# Patient Record
Sex: Female | Born: 1945 | Race: White | Hispanic: No | Smoking: Former smoker
Health system: Southern US, Community
[De-identification: ages and names within clinical notes are randomized; demographics above are authoritative.]

## PROBLEM LIST (undated history)

## (undated) DIAGNOSIS — R011 Cardiac murmur, unspecified: Secondary | ICD-10-CM

## (undated) DIAGNOSIS — Z8739 Personal history of other diseases of the musculoskeletal system and connective tissue: Secondary | ICD-10-CM

## (undated) DIAGNOSIS — I2510 Atherosclerotic heart disease of native coronary artery without angina pectoris: Secondary | ICD-10-CM

## (undated) DIAGNOSIS — Z794 Long term (current) use of insulin: Secondary | ICD-10-CM

## (undated) DIAGNOSIS — I6529 Occlusion and stenosis of unspecified carotid artery: Secondary | ICD-10-CM

## (undated) DIAGNOSIS — Z8673 Personal history of transient ischemic attack (TIA), and cerebral infarction without residual deficits: Secondary | ICD-10-CM

## (undated) DIAGNOSIS — I10 Essential (primary) hypertension: Secondary | ICD-10-CM

## (undated) DIAGNOSIS — Z8669 Personal history of other diseases of the nervous system and sense organs: Secondary | ICD-10-CM

## (undated) DIAGNOSIS — N185 Chronic kidney disease, stage 5: Secondary | ICD-10-CM

## (undated) DIAGNOSIS — D509 Iron deficiency anemia, unspecified: Secondary | ICD-10-CM

## (undated) DIAGNOSIS — L97509 Non-pressure chronic ulcer of other part of unspecified foot with unspecified severity: Secondary | ICD-10-CM

## (undated) DIAGNOSIS — E785 Hyperlipidemia, unspecified: Secondary | ICD-10-CM

## (undated) DIAGNOSIS — E119 Type 2 diabetes mellitus without complications: Secondary | ICD-10-CM

## (undated) DIAGNOSIS — IMO0001 Reserved for inherently not codable concepts without codable children: Secondary | ICD-10-CM

## (undated) DIAGNOSIS — M1990 Unspecified osteoarthritis, unspecified site: Secondary | ICD-10-CM

## (undated) DIAGNOSIS — H269 Unspecified cataract: Secondary | ICD-10-CM

## (undated) DIAGNOSIS — G629 Polyneuropathy, unspecified: Secondary | ICD-10-CM

## (undated) DIAGNOSIS — I739 Peripheral vascular disease, unspecified: Secondary | ICD-10-CM

## (undated) DIAGNOSIS — K219 Gastro-esophageal reflux disease without esophagitis: Secondary | ICD-10-CM

## (undated) DIAGNOSIS — I509 Heart failure, unspecified: Secondary | ICD-10-CM

## (undated) DIAGNOSIS — K449 Diaphragmatic hernia without obstruction or gangrene: Secondary | ICD-10-CM

## (undated) HISTORY — DX: Unspecified osteoarthritis, unspecified site: M19.90

## (undated) HISTORY — DX: Hyperlipidemia, unspecified: E78.5

## (undated) HISTORY — PX: CATARACT EXTRACTION: SUR2

## (undated) HISTORY — DX: Diaphragmatic hernia without obstruction or gangrene: K44.9

## (undated) HISTORY — DX: Occlusion and stenosis of unspecified carotid artery: I65.29

## (undated) HISTORY — DX: Unspecified cataract: H26.9

## (undated) HISTORY — DX: Polyneuropathy, unspecified: G62.9

## (undated) HISTORY — DX: Type 2 diabetes mellitus without complications: E11.9

## (undated) HISTORY — DX: Non-pressure chronic ulcer of other part of unspecified foot with unspecified severity: L97.509

## (undated) HISTORY — DX: Heart failure, unspecified: I50.9

## (undated) HISTORY — DX: Reserved for inherently not codable concepts without codable children: IMO0001

## (undated) HISTORY — DX: Long term (current) use of insulin: Z79.4

## (undated) HISTORY — DX: Essential (primary) hypertension: I10

## (undated) HISTORY — DX: Atherosclerotic heart disease of native coronary artery without angina pectoris: I25.10

## (undated) HISTORY — DX: Gastro-esophageal reflux disease without esophagitis: K21.9

## (undated) HISTORY — DX: Cardiac murmur, unspecified: R01.1

---

## 1999-05-20 ENCOUNTER — Ambulatory Visit (HOSPITAL_COMMUNITY): Admission: RE | Admit: 1999-05-20 | Discharge: 1999-05-20 | Payer: Self-pay | Admitting: Family Medicine

## 2001-03-08 ENCOUNTER — Ambulatory Visit (HOSPITAL_COMMUNITY): Admission: RE | Admit: 2001-03-08 | Discharge: 2001-03-08 | Payer: Self-pay | Admitting: Family Medicine

## 2001-03-08 ENCOUNTER — Encounter: Payer: Self-pay | Admitting: Family Medicine

## 2002-03-27 ENCOUNTER — Ambulatory Visit (HOSPITAL_COMMUNITY): Admission: RE | Admit: 2002-03-27 | Discharge: 2002-03-27 | Payer: Self-pay | Admitting: Family Medicine

## 2002-03-27 ENCOUNTER — Encounter: Payer: Self-pay | Admitting: Family Medicine

## 2003-05-03 ENCOUNTER — Encounter: Payer: Self-pay | Admitting: Orthopedic Surgery

## 2003-05-03 ENCOUNTER — Ambulatory Visit (HOSPITAL_COMMUNITY): Admission: RE | Admit: 2003-05-03 | Discharge: 2003-05-03 | Payer: Self-pay | Admitting: Orthopedic Surgery

## 2005-08-30 ENCOUNTER — Inpatient Hospital Stay (HOSPITAL_COMMUNITY): Admission: EM | Admit: 2005-08-30 | Discharge: 2005-09-13 | Payer: Self-pay | Admitting: Emergency Medicine

## 2005-09-08 ENCOUNTER — Ambulatory Visit: Payer: Self-pay | Admitting: *Deleted

## 2005-09-11 ENCOUNTER — Encounter: Payer: Self-pay | Admitting: *Deleted

## 2005-09-11 ENCOUNTER — Ambulatory Visit: Payer: Self-pay | Admitting: Internal Medicine

## 2005-09-13 ENCOUNTER — Ambulatory Visit: Payer: Self-pay | Admitting: Cardiology

## 2005-09-18 ENCOUNTER — Emergency Department (HOSPITAL_COMMUNITY): Admission: EM | Admit: 2005-09-18 | Discharge: 2005-09-18 | Payer: Self-pay | Admitting: Emergency Medicine

## 2005-09-26 ENCOUNTER — Ambulatory Visit: Payer: Self-pay | Admitting: *Deleted

## 2005-10-05 ENCOUNTER — Inpatient Hospital Stay (HOSPITAL_COMMUNITY): Admission: EM | Admit: 2005-10-05 | Discharge: 2005-10-11 | Payer: Self-pay | Admitting: Emergency Medicine

## 2005-10-09 ENCOUNTER — Ambulatory Visit: Payer: Self-pay | Admitting: Internal Medicine

## 2005-10-12 ENCOUNTER — Encounter (HOSPITAL_COMMUNITY): Admission: RE | Admit: 2005-10-12 | Discharge: 2005-11-11 | Payer: Self-pay | Admitting: Internal Medicine

## 2005-11-14 ENCOUNTER — Encounter (HOSPITAL_COMMUNITY): Admission: RE | Admit: 2005-11-14 | Discharge: 2005-12-14 | Payer: Self-pay | Admitting: General Surgery

## 2005-12-15 ENCOUNTER — Encounter (HOSPITAL_COMMUNITY): Admission: RE | Admit: 2005-12-15 | Discharge: 2006-01-14 | Payer: Self-pay | Admitting: General Surgery

## 2005-12-28 ENCOUNTER — Ambulatory Visit: Payer: Self-pay | Admitting: *Deleted

## 2006-01-07 ENCOUNTER — Emergency Department (HOSPITAL_COMMUNITY): Admission: EM | Admit: 2006-01-07 | Discharge: 2006-01-07 | Payer: Self-pay | Admitting: Emergency Medicine

## 2006-01-08 ENCOUNTER — Inpatient Hospital Stay (HOSPITAL_COMMUNITY): Admission: AD | Admit: 2006-01-08 | Discharge: 2006-01-17 | Payer: Self-pay | Admitting: General Surgery

## 2006-01-12 ENCOUNTER — Encounter (INDEPENDENT_AMBULATORY_CARE_PROVIDER_SITE_OTHER): Payer: Self-pay | Admitting: Specialist

## 2006-03-07 ENCOUNTER — Inpatient Hospital Stay (HOSPITAL_COMMUNITY): Admission: EM | Admit: 2006-03-07 | Discharge: 2006-03-11 | Payer: Self-pay | Admitting: Emergency Medicine

## 2006-03-09 ENCOUNTER — Ambulatory Visit: Payer: Self-pay | Admitting: Internal Medicine

## 2006-04-05 ENCOUNTER — Inpatient Hospital Stay (HOSPITAL_COMMUNITY): Admission: EM | Admit: 2006-04-05 | Discharge: 2006-04-18 | Payer: Self-pay | Admitting: Emergency Medicine

## 2006-04-06 ENCOUNTER — Ambulatory Visit: Payer: Self-pay | Admitting: Oncology

## 2006-04-09 ENCOUNTER — Ambulatory Visit: Payer: Self-pay | Admitting: Cardiology

## 2006-04-10 ENCOUNTER — Ambulatory Visit: Payer: Self-pay | Admitting: Orthopedic Surgery

## 2006-04-25 ENCOUNTER — Encounter: Admission: RE | Admit: 2006-04-25 | Discharge: 2006-04-25 | Payer: Self-pay | Admitting: Oncology

## 2006-04-25 ENCOUNTER — Ambulatory Visit (HOSPITAL_COMMUNITY): Payer: Self-pay | Admitting: Oncology

## 2006-05-08 ENCOUNTER — Inpatient Hospital Stay (HOSPITAL_COMMUNITY): Admission: EM | Admit: 2006-05-08 | Discharge: 2006-05-17 | Payer: Self-pay | Admitting: Emergency Medicine

## 2006-07-19 ENCOUNTER — Inpatient Hospital Stay (HOSPITAL_COMMUNITY): Admission: EM | Admit: 2006-07-19 | Discharge: 2006-07-23 | Payer: Self-pay | Admitting: Emergency Medicine

## 2006-09-25 HISTORY — PX: CAROTID ENDARTERECTOMY: SUR193

## 2006-12-26 ENCOUNTER — Encounter (HOSPITAL_COMMUNITY): Admission: RE | Admit: 2006-12-26 | Discharge: 2007-03-26 | Payer: Self-pay | Admitting: Nephrology

## 2007-03-22 IMAGING — CR DG FOOT COMPLETE 3+V*R*
3 series · 3 of 3 positions shown · non-contrast
Comparison: None.

CLINICAL DATA: Spider bite between the 1st and 2nd toes.  
 RIGHT FOOT ? 3 VIEW:

[view not recorded (1 of 3)]
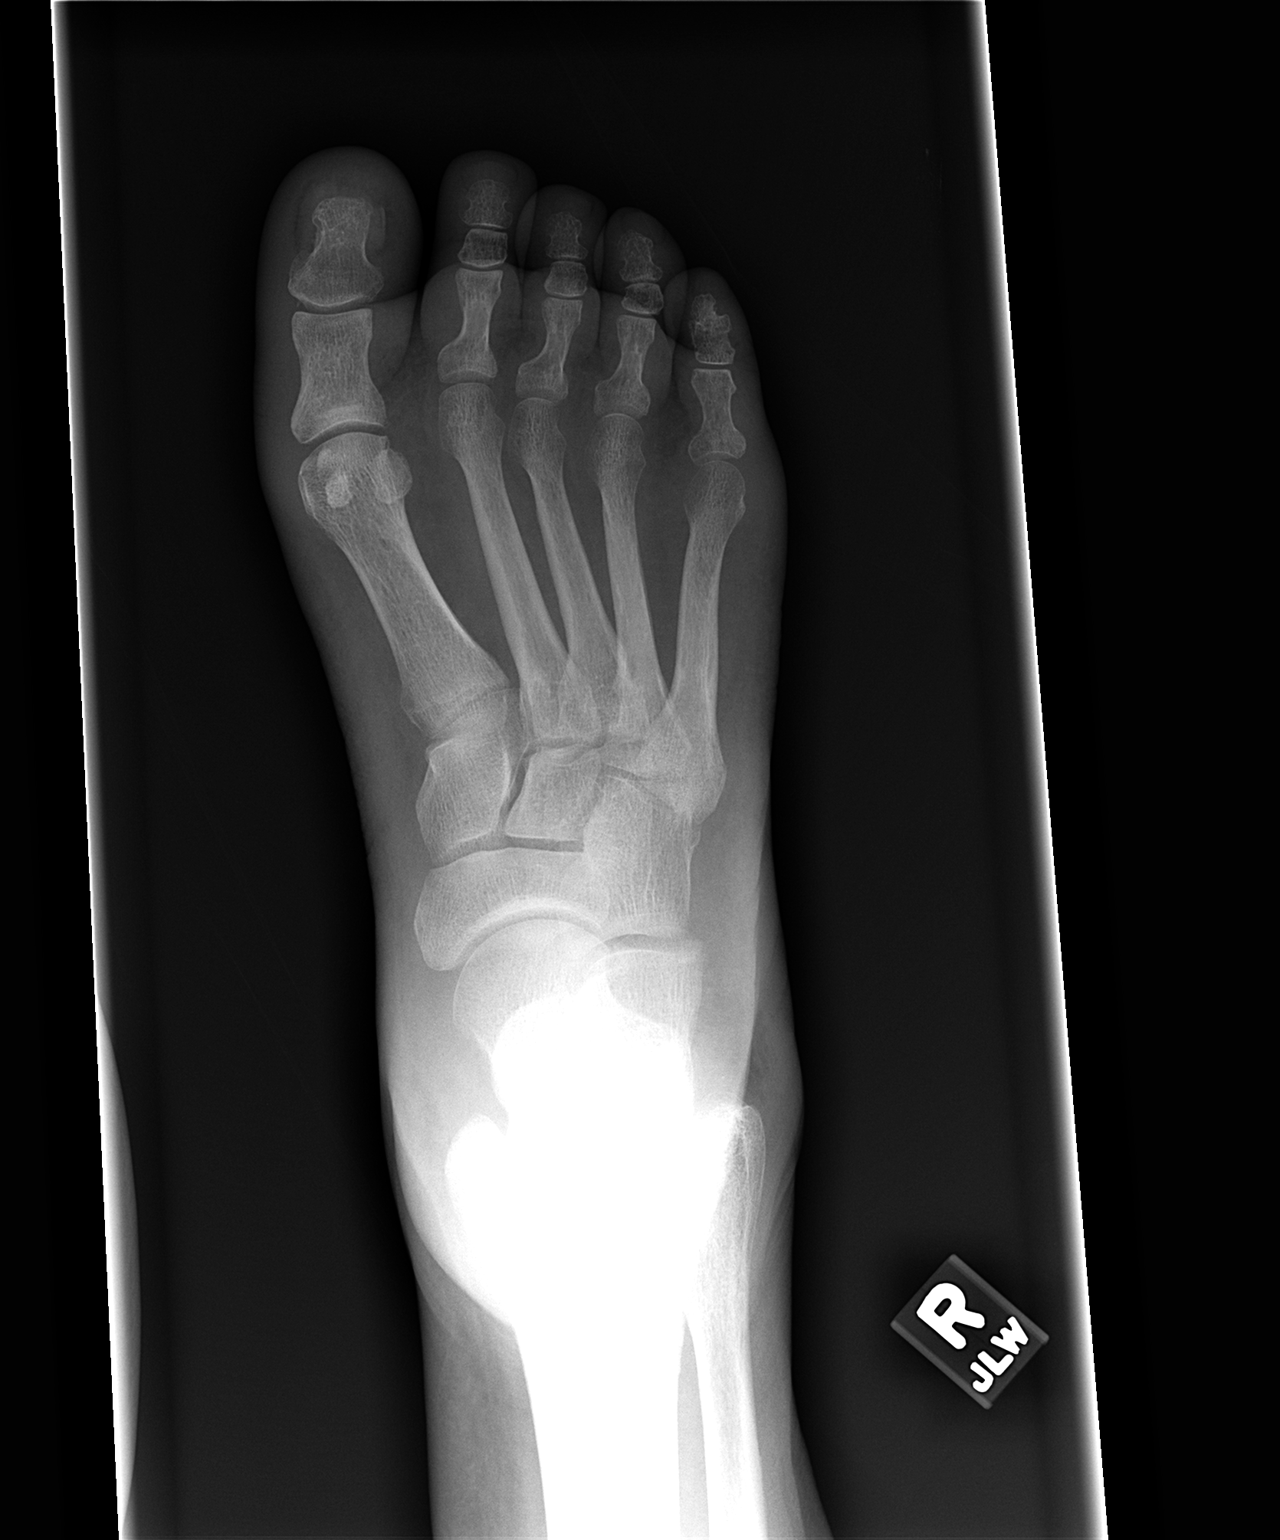

[view not recorded (2 of 3)]
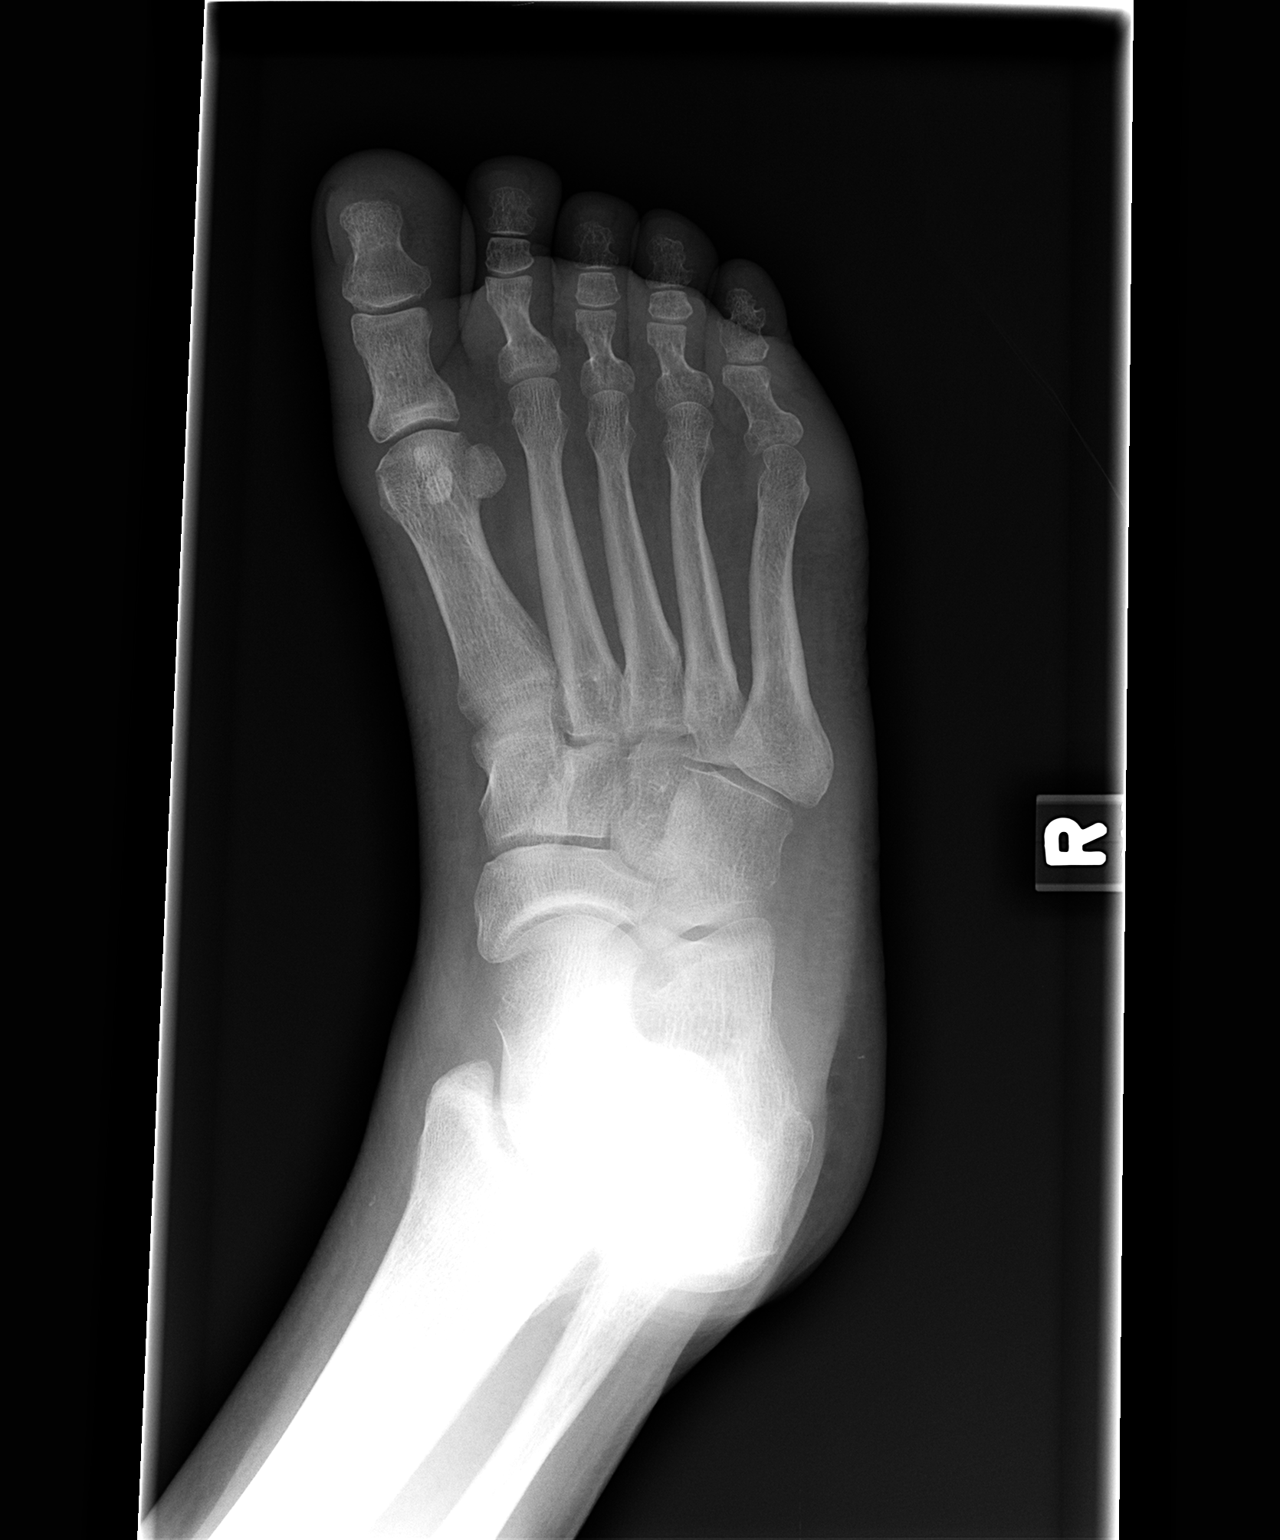

[view not recorded (3 of 3)]
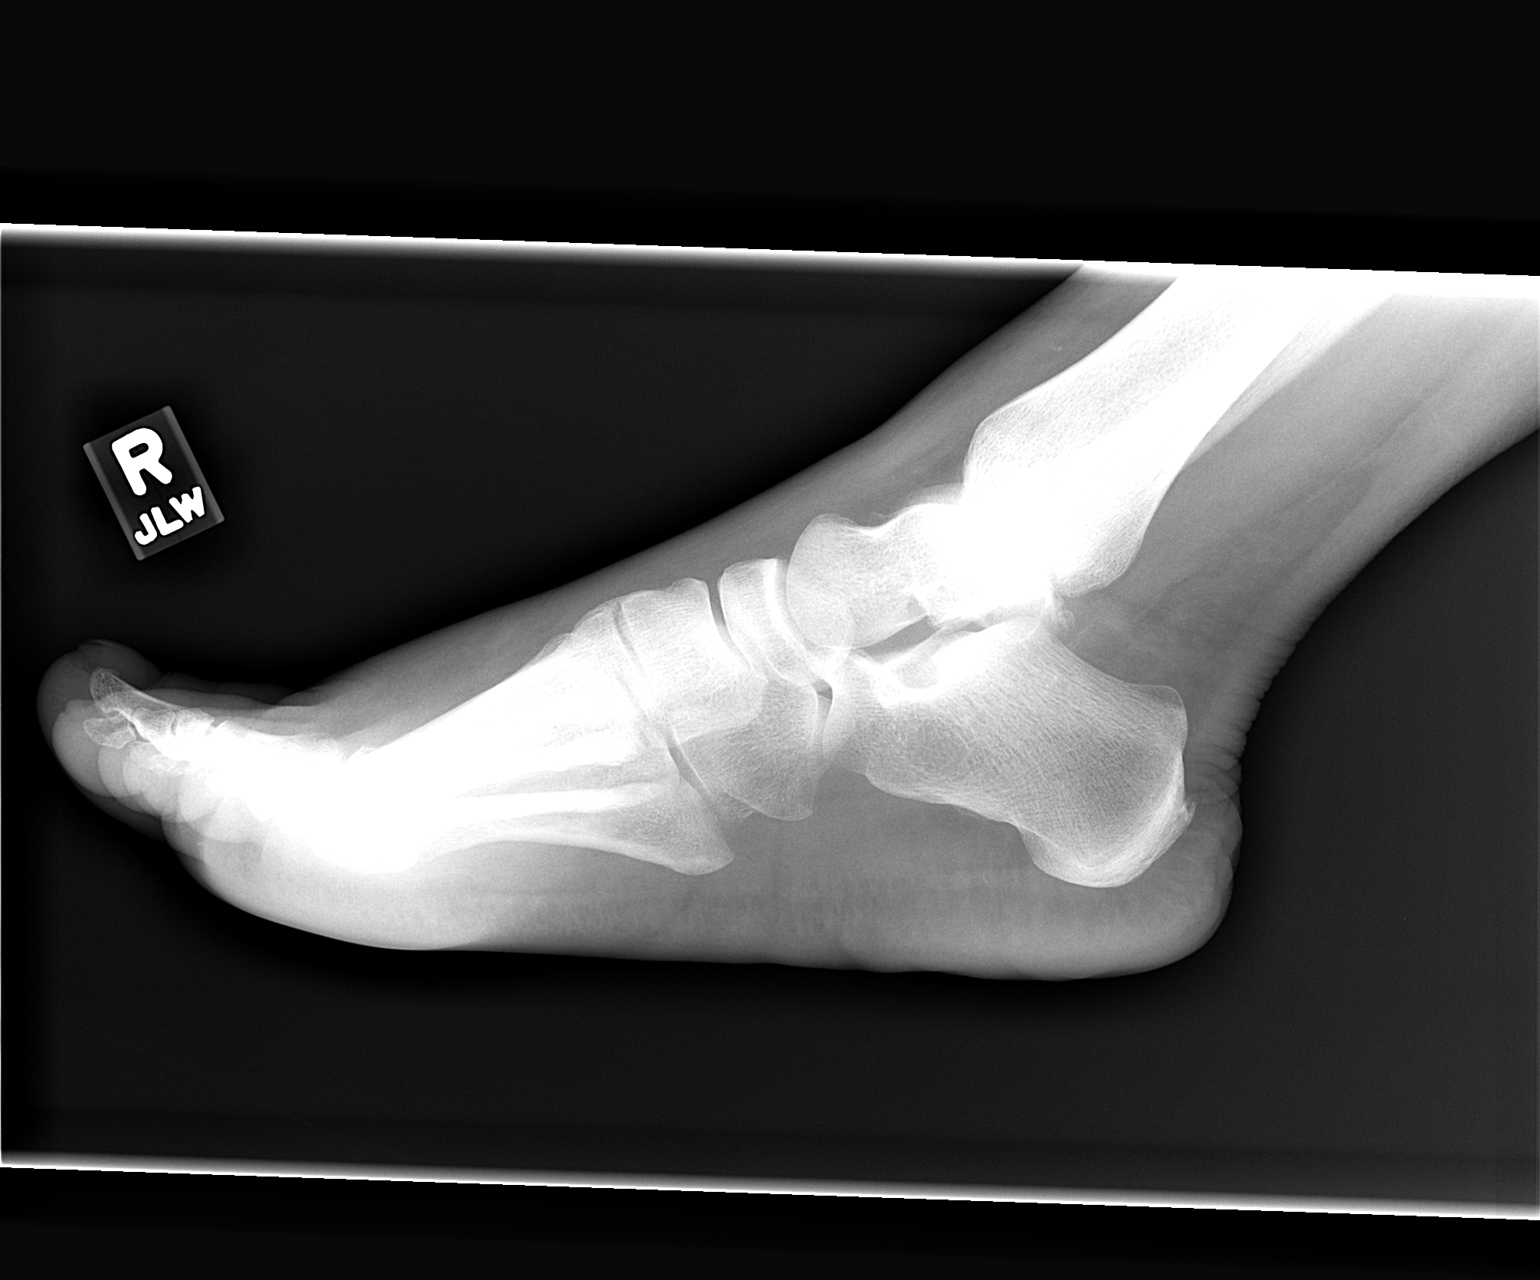

[3 of 3 positions shown; findings below may reference images not displayed]

There is no evidence of fracture or dislocation.  There is no evidence of arthropathy or other focal bone abnormality.  Soft tissues are unremarkable.
IMPRESSION: Negative.

## 2007-04-10 ENCOUNTER — Encounter (HOSPITAL_COMMUNITY): Admission: RE | Admit: 2007-04-10 | Discharge: 2007-07-09 | Payer: Self-pay | Admitting: Nephrology

## 2007-04-10 ENCOUNTER — Encounter: Admission: RE | Admit: 2007-04-10 | Discharge: 2007-04-10 | Payer: Self-pay | Admitting: Nephrology

## 2007-05-28 ENCOUNTER — Ambulatory Visit: Payer: Self-pay | Admitting: Cardiovascular Disease

## 2007-07-01 ENCOUNTER — Ambulatory Visit (HOSPITAL_COMMUNITY): Admission: RE | Admit: 2007-07-01 | Discharge: 2007-07-01 | Payer: Self-pay | Admitting: Family Medicine

## 2007-07-03 ENCOUNTER — Ambulatory Visit: Payer: Self-pay | Admitting: Vascular Surgery

## 2007-07-12 ENCOUNTER — Encounter (HOSPITAL_COMMUNITY): Admission: RE | Admit: 2007-07-12 | Discharge: 2007-10-10 | Payer: Self-pay | Admitting: Nephrology

## 2007-07-15 ENCOUNTER — Ambulatory Visit: Payer: Self-pay | Admitting: Vascular Surgery

## 2007-07-15 ENCOUNTER — Encounter: Payer: Self-pay | Admitting: Vascular Surgery

## 2007-07-15 ENCOUNTER — Inpatient Hospital Stay (HOSPITAL_COMMUNITY): Admission: RE | Admit: 2007-07-15 | Discharge: 2007-07-16 | Payer: Self-pay | Admitting: Vascular Surgery

## 2007-07-31 ENCOUNTER — Ambulatory Visit: Payer: Self-pay | Admitting: Vascular Surgery

## 2007-08-15 ENCOUNTER — Ambulatory Visit: Admission: RE | Admit: 2007-08-15 | Discharge: 2007-08-15 | Payer: Self-pay | Admitting: Nephrology

## 2007-08-15 ENCOUNTER — Ambulatory Visit: Payer: Self-pay | Admitting: Vascular Surgery

## 2007-08-15 ENCOUNTER — Encounter (INDEPENDENT_AMBULATORY_CARE_PROVIDER_SITE_OTHER): Payer: Self-pay | Admitting: Nephrology

## 2007-08-28 ENCOUNTER — Ambulatory Visit: Payer: Self-pay | Admitting: Cardiology

## 2007-09-26 HISTORY — PX: ARTERIOVENOUS GRAFT PLACEMENT: SUR1029

## 2007-10-16 ENCOUNTER — Encounter (HOSPITAL_COMMUNITY): Admission: RE | Admit: 2007-10-16 | Discharge: 2008-01-14 | Payer: Self-pay | Admitting: Nephrology

## 2007-10-26 IMAGING — CR DG CHEST 1V PORT
1 series · 1 of 1 positions shown · non-contrast
Comparison: 01/16/06.

CLINICAL DATA: Hyperglycemia.  
 PORTABLE CHEST - 1 VIEW - 04/05/06:

[view not recorded]
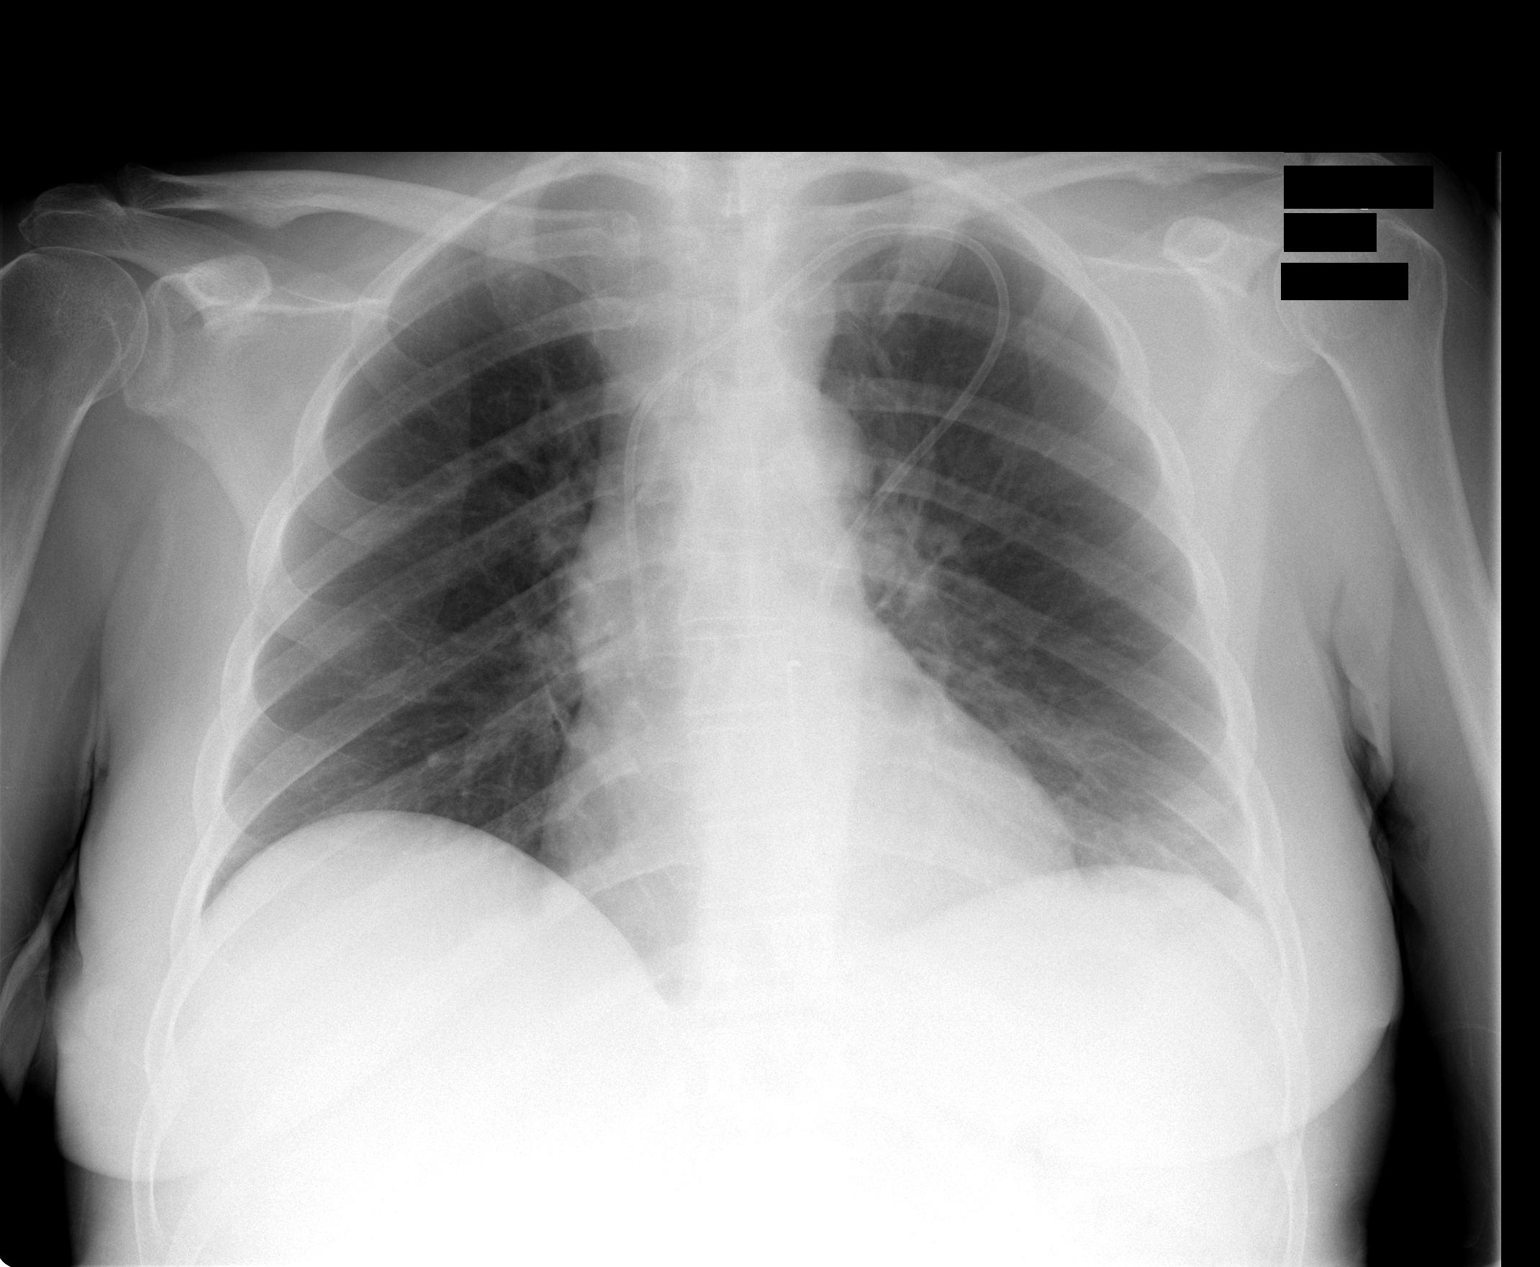

[1 of 1 positions shown; findings below may reference images not displayed]

FINDINGS: A left-sided Port-a-cath is in place unchanged in position.  The lungs are clear.   There is no pleural effusion.  The cardiac and mediastinal contours appear normal.  No focal bony abnormality.
IMPRESSION: No acute disease.

## 2007-10-27 IMAGING — US US RENAL
1 series · 14 of 25 positions shown · non-contrast
Comparison: none

CLINICAL DATA: Renal failure.  Anemia.
 RENAL/URINARY TRACT ULTRASOUND:
TECHNIQUE: Complete ultrasound examination of the urinary tract was performed including evaluation of the kidneys, renal collecting systems, and urinary bladder.

[Series 1: unknown · 0.34mm/px · 14 of 43 slices shown]
[im 1/43]
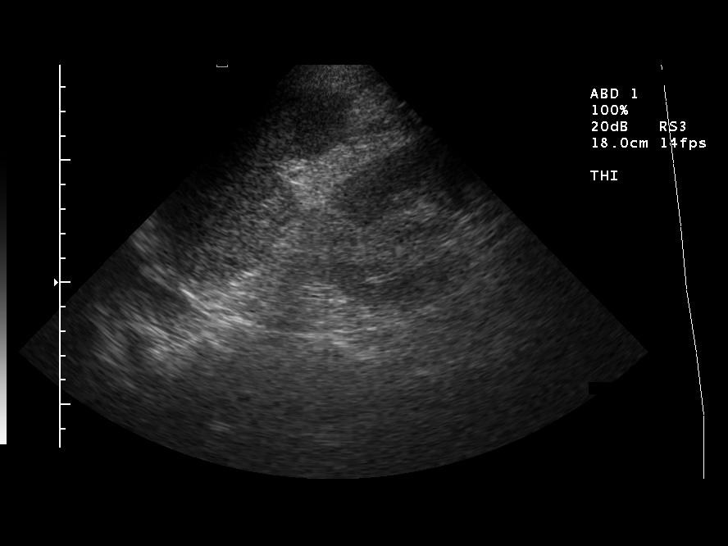
[im 4/43]
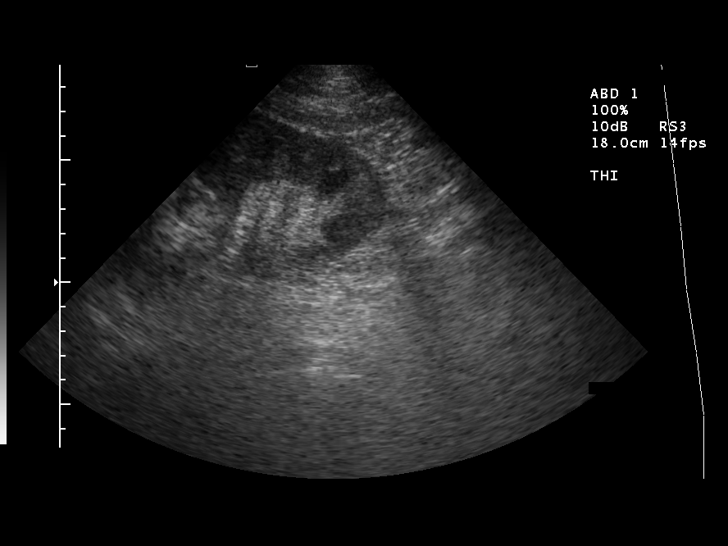
[im 8/43]
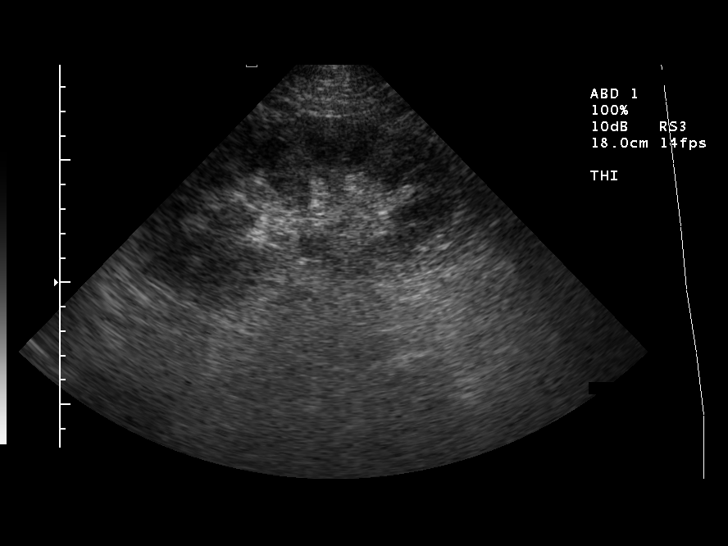
[im 11/43]
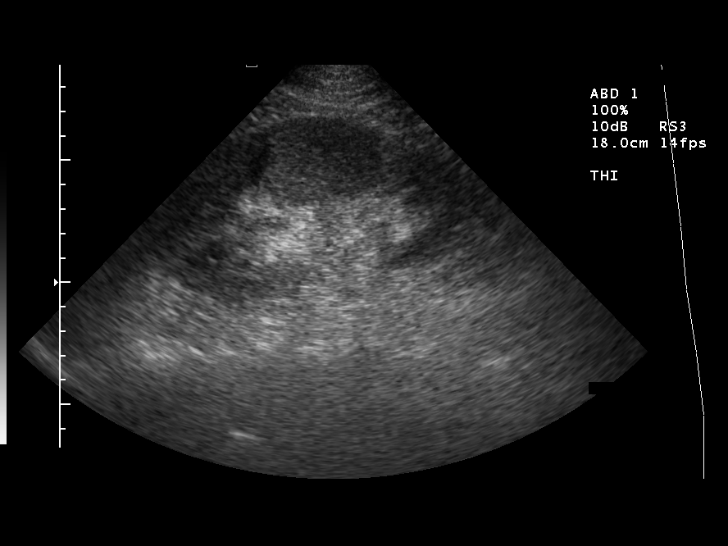
[im 15/43]
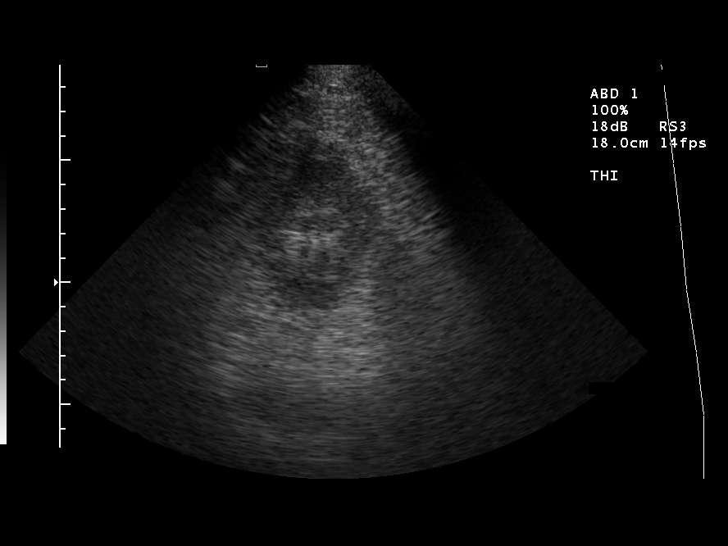
[im 16/43]
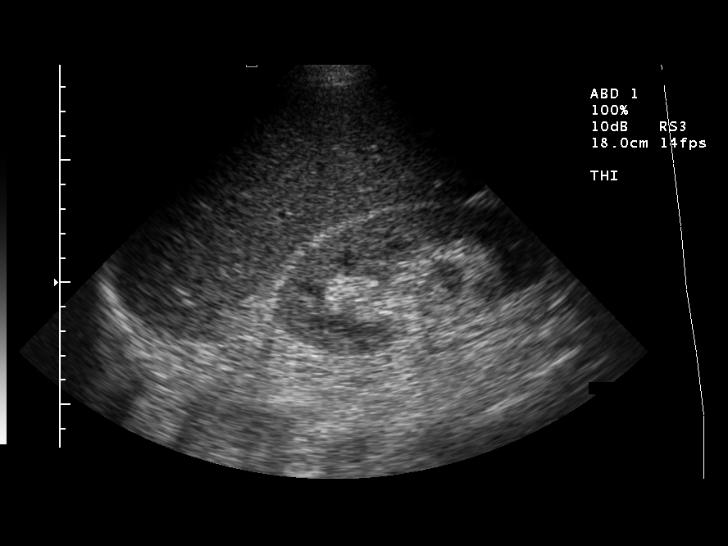
[im 20/43]
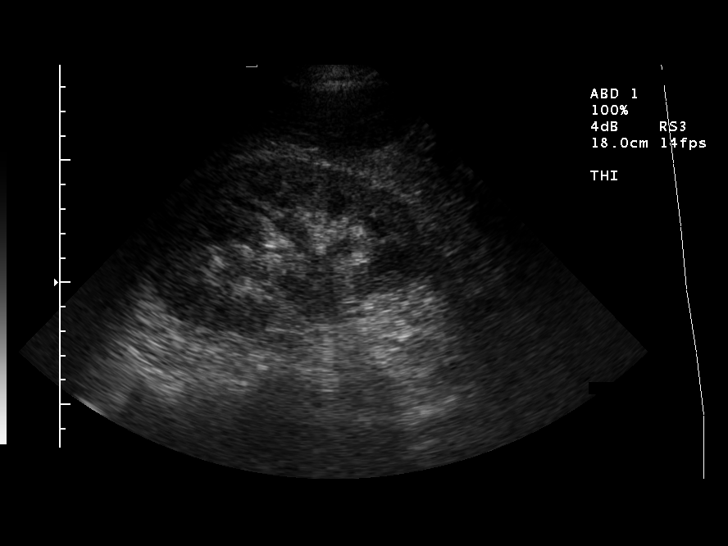
[im 23/43]
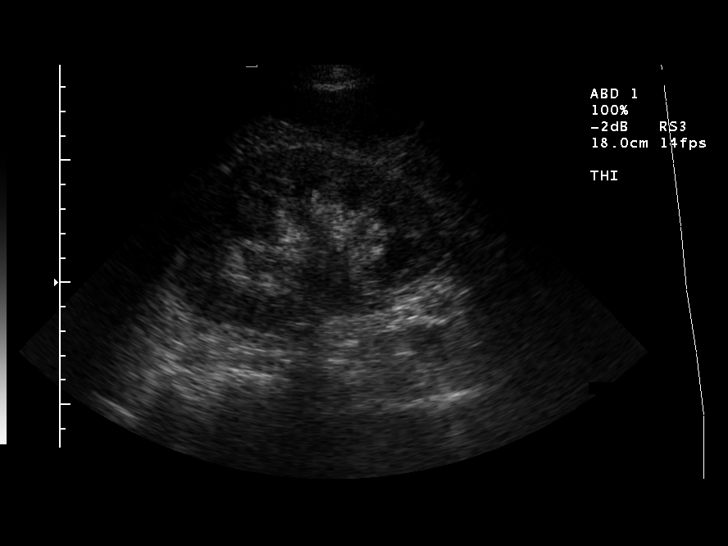
[im 27/43]
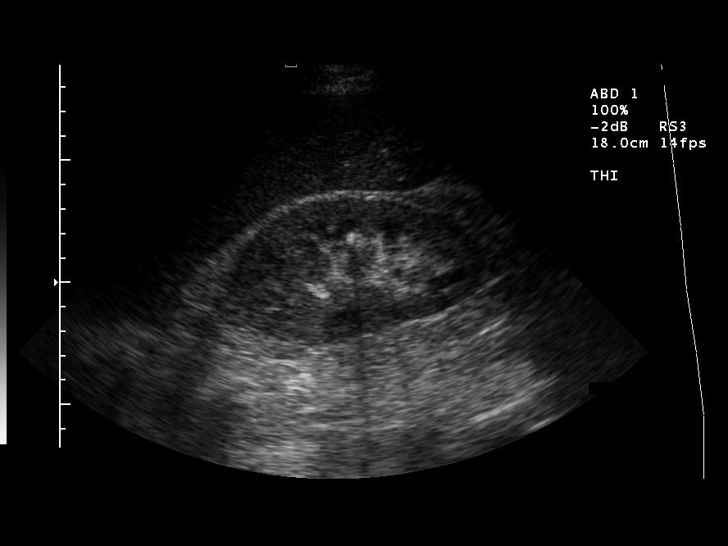
[im 29/43]
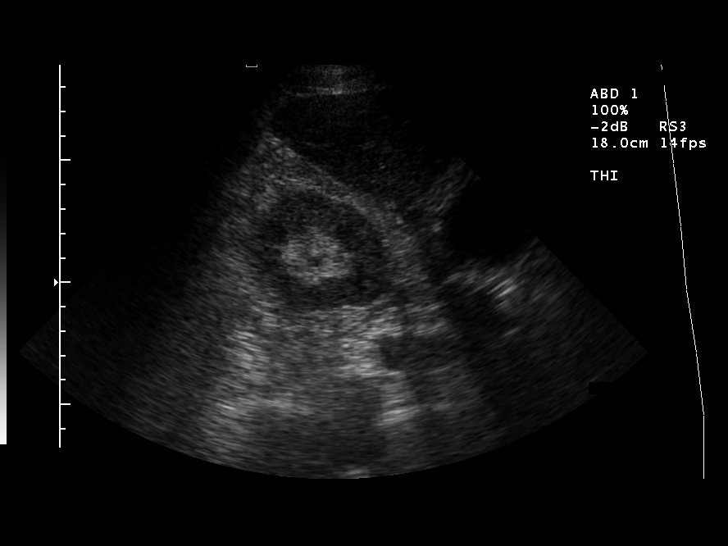
[im 32/43]
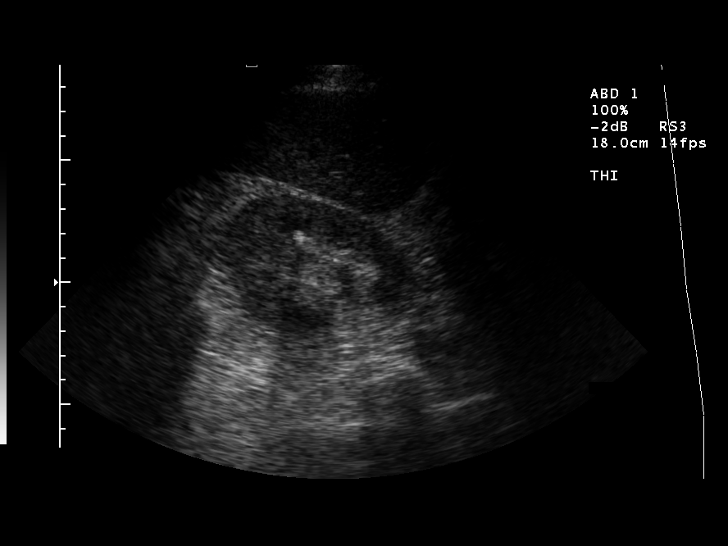
[im 36/43]
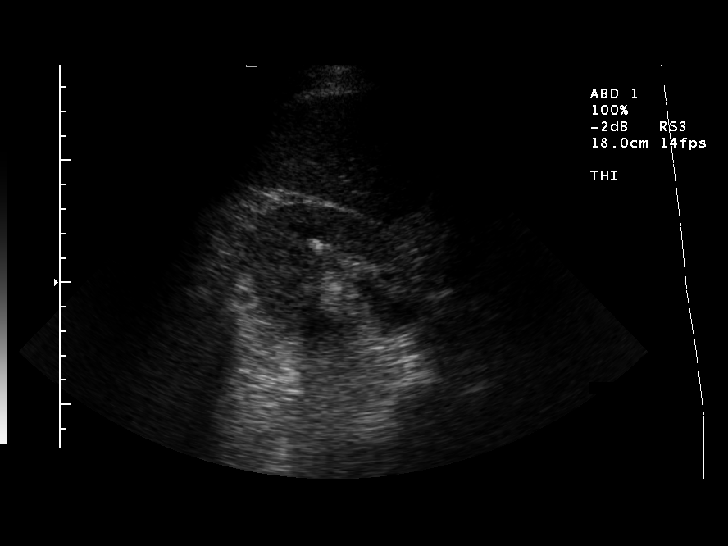
[im 39/43]
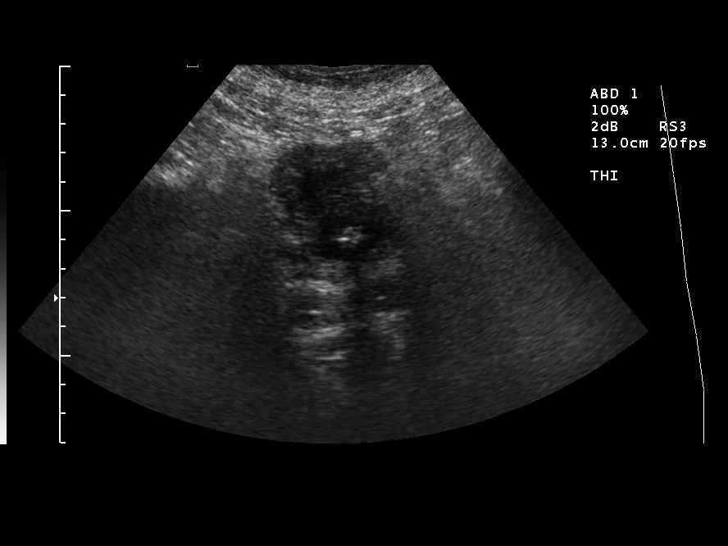
[im 43/43]
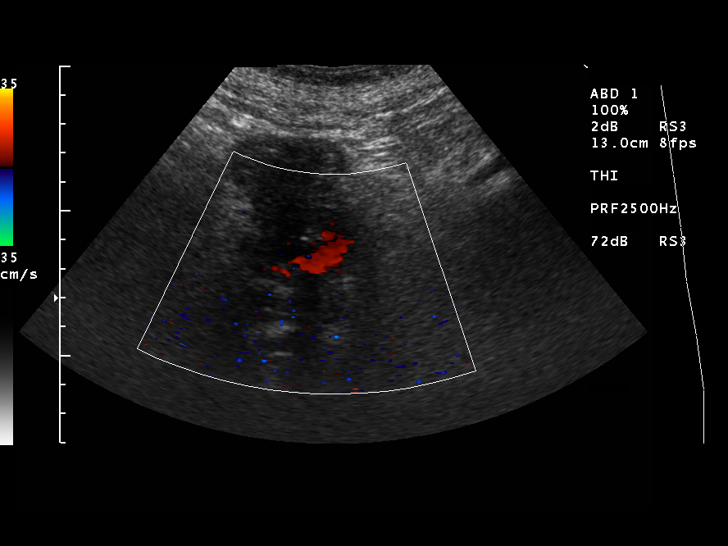

[14 of 25 positions shown; findings below may reference images not displayed]

FINDINGS: Left kidney appears normal at 13.4 cm in length.  The right kidney measures 12.5 cm in length and demonstrates mild right hydronephrosis with a small stone in the mid portion of the right kidney.  There is no thinning of the renal cortex of either kidney.   There is slight increased echogenicity of the renal parenchyma, which can be seen with renal medical disease.
 Foley catheter is present in the bladder and the bladder is decompressed.  However, a jet from the right ureter is visible.
IMPRESSION: Slight increased echogenicity of the renal parenchyma bilaterally, which can be seen with renal medical disease.  Mild right hydronephrosis.  Single right renal stone.  Jet from the right ureter is seen into the bladder indicating that the right ureter is patent.

## 2007-11-28 ENCOUNTER — Ambulatory Visit (HOSPITAL_COMMUNITY): Admission: RE | Admit: 2007-11-28 | Discharge: 2007-11-29 | Payer: Self-pay | Admitting: Ophthalmology

## 2007-12-17 ENCOUNTER — Encounter: Admission: RE | Admit: 2007-12-17 | Discharge: 2008-03-16 | Payer: Self-pay | Admitting: Family Medicine

## 2008-01-17 ENCOUNTER — Encounter (HOSPITAL_COMMUNITY): Admission: RE | Admit: 2008-01-17 | Discharge: 2008-04-16 | Payer: Self-pay | Admitting: Nephrology

## 2008-02-26 ENCOUNTER — Ambulatory Visit: Payer: Self-pay | Admitting: Cardiology

## 2008-03-13 ENCOUNTER — Ambulatory Visit: Payer: Self-pay

## 2008-03-17 ENCOUNTER — Encounter: Admission: RE | Admit: 2008-03-17 | Discharge: 2008-05-26 | Payer: Self-pay | Admitting: Family Medicine

## 2008-05-22 ENCOUNTER — Encounter (HOSPITAL_COMMUNITY): Admission: RE | Admit: 2008-05-22 | Discharge: 2008-08-20 | Payer: Self-pay | Admitting: Nephrology

## 2008-06-02 ENCOUNTER — Ambulatory Visit: Payer: Self-pay | Admitting: Cardiovascular Disease

## 2008-06-23 ENCOUNTER — Ambulatory Visit: Payer: Self-pay | Admitting: Vascular Surgery

## 2008-07-03 ENCOUNTER — Ambulatory Visit: Payer: Self-pay | Admitting: Vascular Surgery

## 2008-07-03 ENCOUNTER — Ambulatory Visit (HOSPITAL_COMMUNITY): Admission: RE | Admit: 2008-07-03 | Discharge: 2008-07-03 | Payer: Self-pay | Admitting: Vascular Surgery

## 2008-07-21 ENCOUNTER — Ambulatory Visit: Payer: Self-pay | Admitting: Vascular Surgery

## 2008-08-21 ENCOUNTER — Encounter (HOSPITAL_COMMUNITY): Admission: RE | Admit: 2008-08-21 | Discharge: 2008-09-24 | Payer: Self-pay | Admitting: Nephrology

## 2008-09-28 ENCOUNTER — Encounter (HOSPITAL_COMMUNITY): Admission: RE | Admit: 2008-09-28 | Discharge: 2008-12-27 | Payer: Self-pay | Admitting: Nephrology

## 2008-12-31 ENCOUNTER — Encounter (HOSPITAL_COMMUNITY): Admission: RE | Admit: 2008-12-31 | Discharge: 2009-03-31 | Payer: Self-pay | Admitting: Nephrology

## 2009-01-20 IMAGING — US US CAROTID DUPLEX BILAT
1 series · 13 of 24 positions shown · non-contrast
Comparison: none

HISTORY: Left carotid bruit, diabetes, hypertension, preoperative assessment

[Series 1: unknown · 0.07mm/px · 13 of 58 slices shown]
[im 1/58]
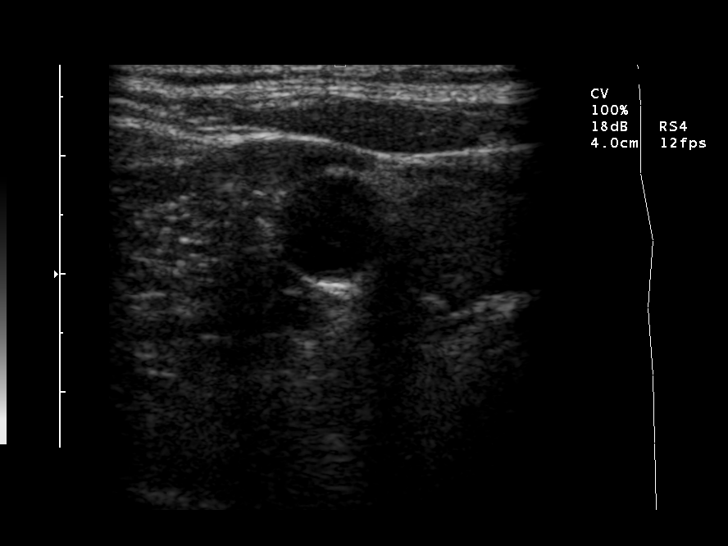
[im 5/58]
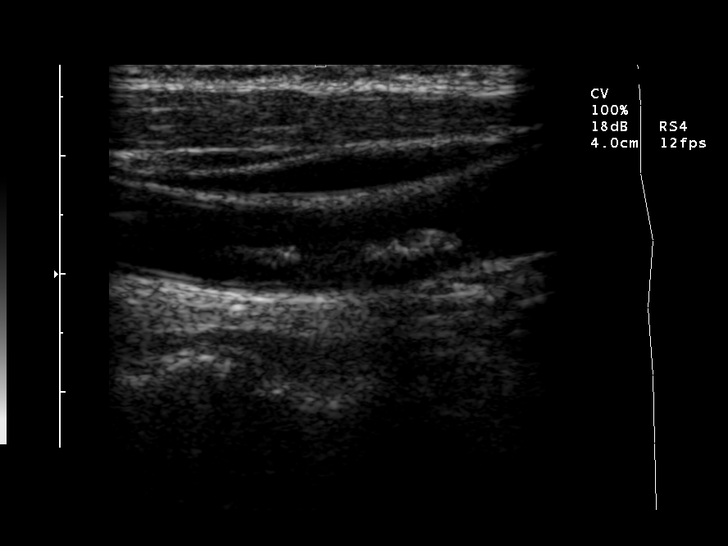
[im 10/58]
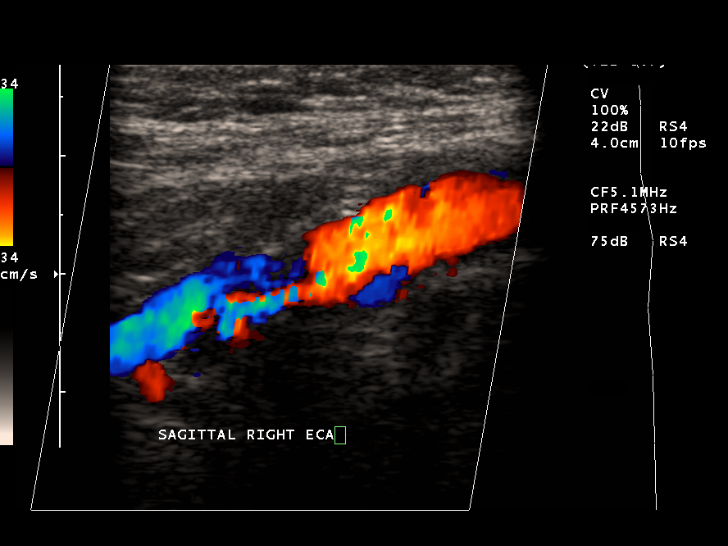
[im 15/58]
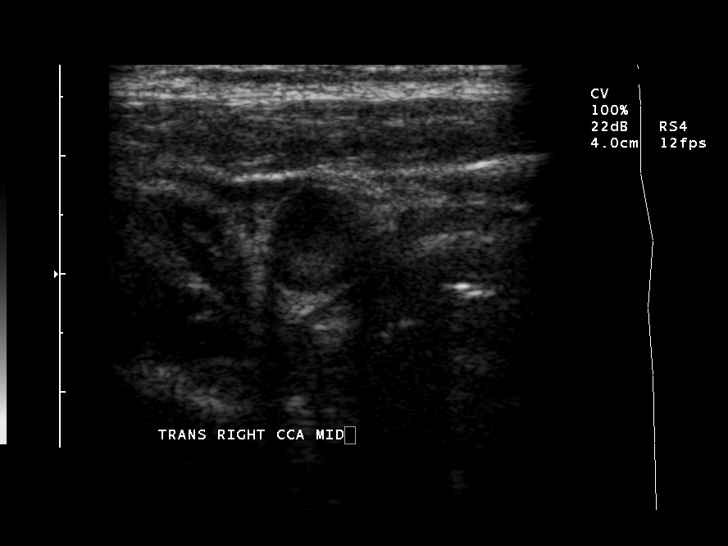
[im 20/58]
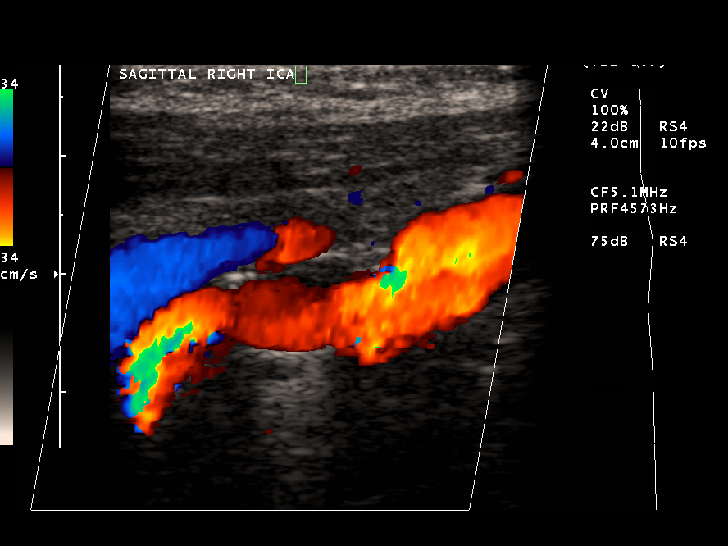
[im 25/58]
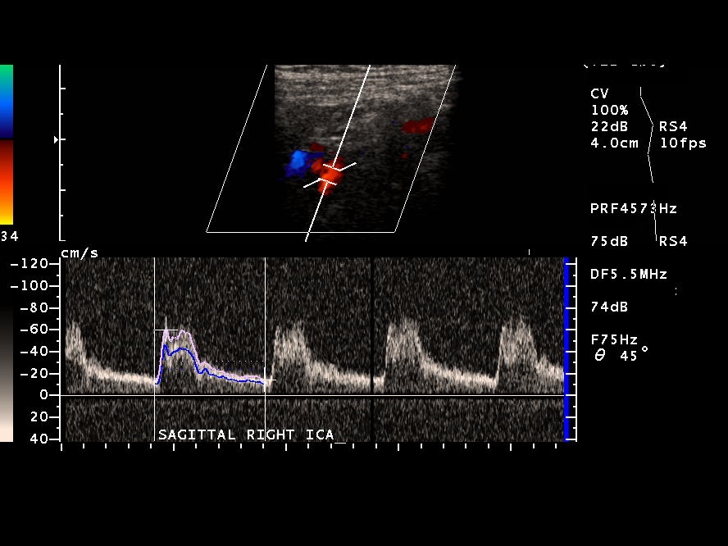
[im 30/58]
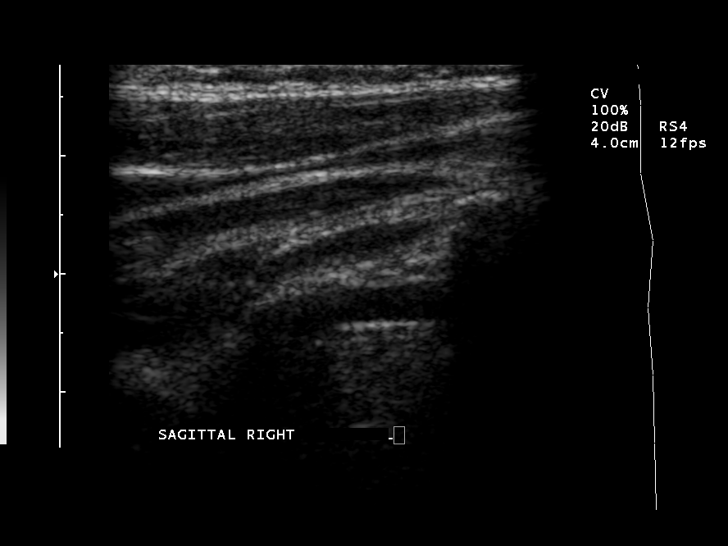
[im 33/58]
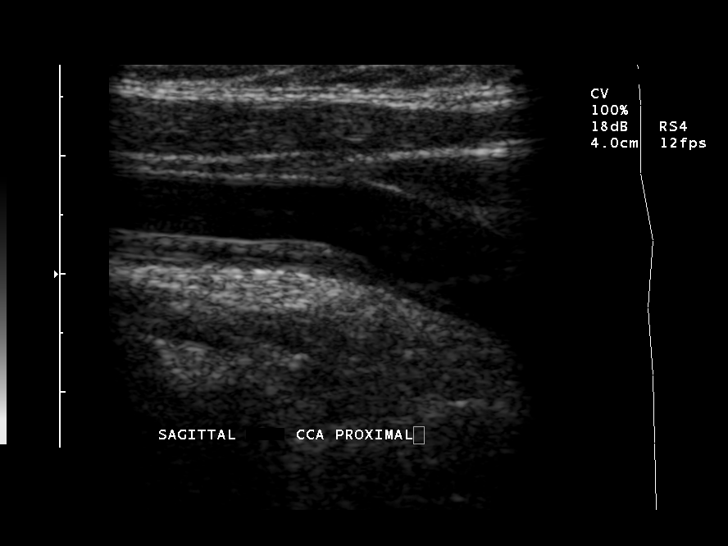
[im 38/58]
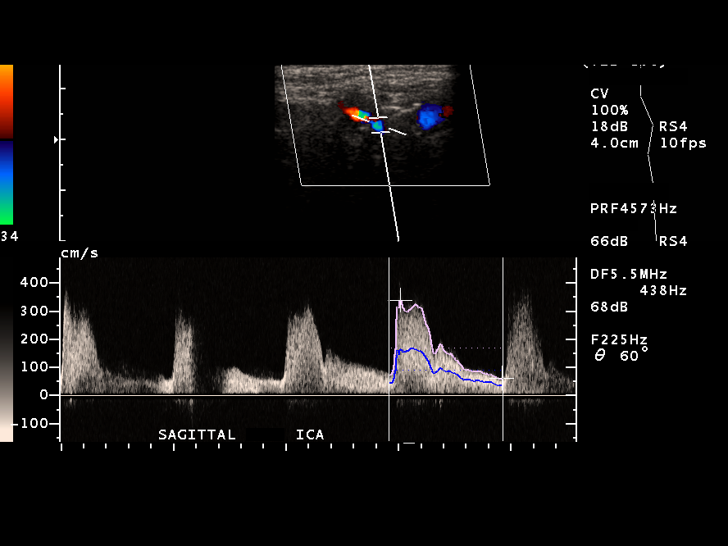
[im 43/58]
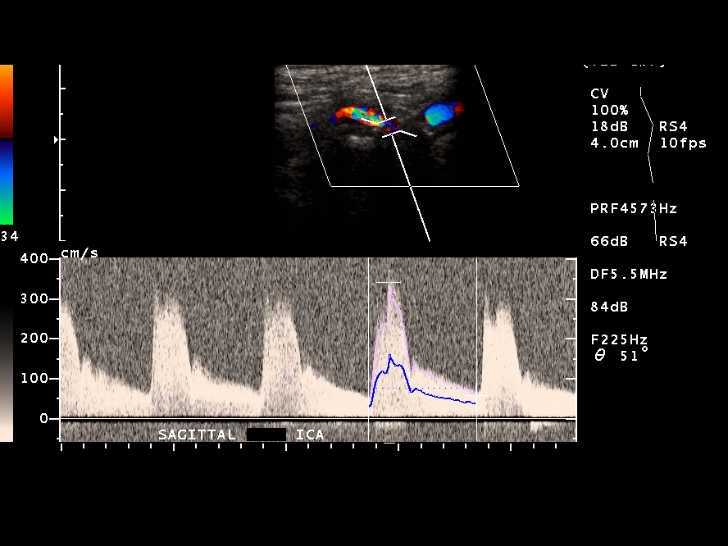
[im 48/58]
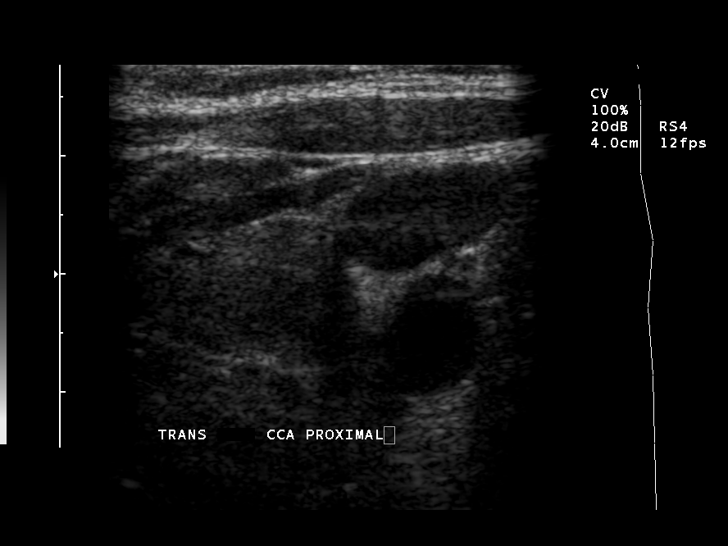
[im 53/58]
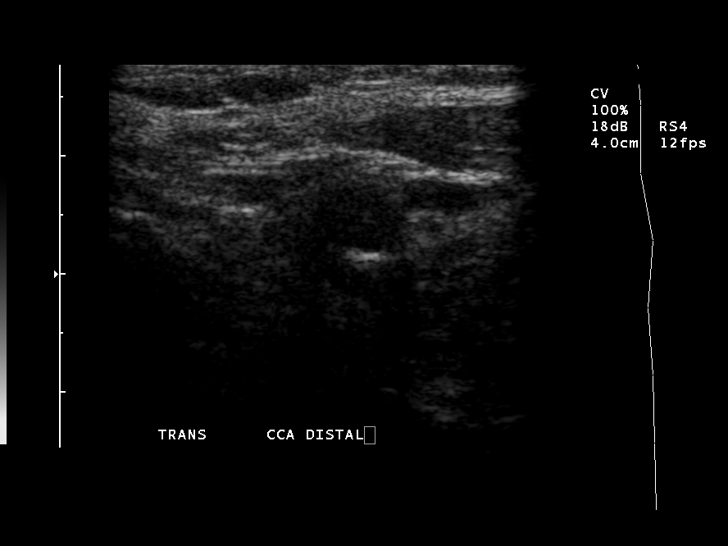
[im 58/58]
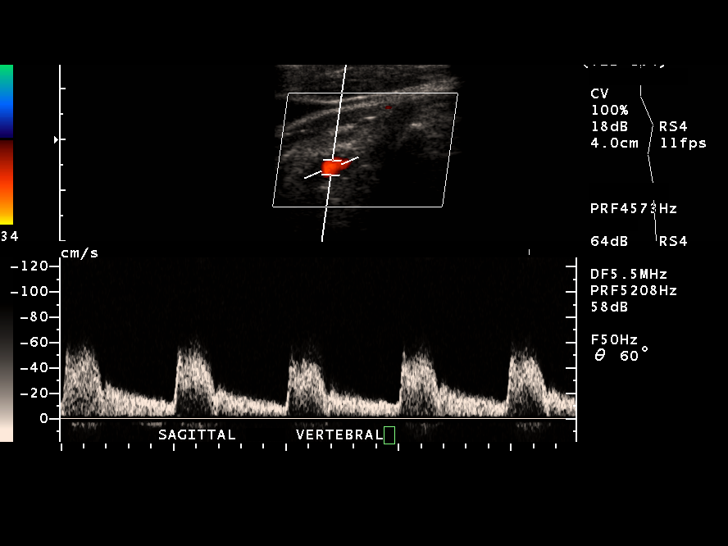

[13 of 24 positions shown; findings below may reference images not displayed]

ULTRASOUND CAROTID DUPLEX DOPPLER BILATERAL:

Gray scale, color Doppler, and pulse wave imaging of carotid systems performed
bilaterally. 

Plaque formation identified bilateral carotid bulbs, common carotid arteries,
left ICA, and bilateral ECA.
Greatest degree of plaque formation is seen at proximal left ICA.
On color Doppler imaging, turbulent flow identified right ECA, in right ICA at
right carotid bulb and at proximal right ICA from mild plaque and tortuosity,
left ICA and left ECA.
Elevated peak systolic velocity noted with turbulence in proximal left ICA.
Spectral broadening and turbulence on wave form analysis left ICA.
Following peak systolic velocities obtained, in cm per second:

Right CCA  62
Right ICA  124
Right ECA  160
Right ICA/CCA ratio
Right ICA EDV  24
Right CCA EDV  no
Right ICA/CCA EDV ratio

Left  CCA  69
Left  ICA  340
Left  ECA  214
Left  ICA/CCA ratio
Left  ICA EDV  57
Left  CCA EDV  10
Left  ICA/CCA EDV ratio

Antegrade flow present bilateral vertebral arteries.
IMPRESSION: Significant plaque formation carotid systems bilaterally with probable bilateral
ECA stenoses.
Significant stenosis proximal left ICA, where obtained values correspond to a 80
to 99% diameter stenosis.
Peak systolic velocity in right ICA is at the borderline of the less than and
greater than 50% percent stenosis ranges. 
Findings called to Dr. Siordia at [HOSPITAL] at time of
interpretation.

## 2009-03-12 ENCOUNTER — Ambulatory Visit (HOSPITAL_COMMUNITY): Admission: RE | Admit: 2009-03-12 | Discharge: 2009-03-12 | Payer: Self-pay | Admitting: Nephrology

## 2009-03-27 ENCOUNTER — Emergency Department (HOSPITAL_COMMUNITY): Admission: EM | Admit: 2009-03-27 | Discharge: 2009-03-28 | Payer: Self-pay | Admitting: Emergency Medicine

## 2009-04-07 ENCOUNTER — Encounter (INDEPENDENT_AMBULATORY_CARE_PROVIDER_SITE_OTHER): Payer: Self-pay | Admitting: *Deleted

## 2009-04-09 ENCOUNTER — Encounter (HOSPITAL_COMMUNITY): Admission: RE | Admit: 2009-04-09 | Discharge: 2009-07-08 | Payer: Self-pay | Admitting: Nephrology

## 2009-06-16 DIAGNOSIS — E119 Type 2 diabetes mellitus without complications: Secondary | ICD-10-CM | POA: Insufficient documentation

## 2009-06-16 DIAGNOSIS — I2510 Atherosclerotic heart disease of native coronary artery without angina pectoris: Secondary | ICD-10-CM | POA: Insufficient documentation

## 2009-06-16 DIAGNOSIS — E785 Hyperlipidemia, unspecified: Secondary | ICD-10-CM

## 2009-06-16 DIAGNOSIS — G609 Hereditary and idiopathic neuropathy, unspecified: Secondary | ICD-10-CM | POA: Insufficient documentation

## 2009-06-16 DIAGNOSIS — I5030 Unspecified diastolic (congestive) heart failure: Secondary | ICD-10-CM | POA: Insufficient documentation

## 2009-06-16 DIAGNOSIS — K219 Gastro-esophageal reflux disease without esophagitis: Secondary | ICD-10-CM | POA: Insufficient documentation

## 2009-06-16 DIAGNOSIS — I739 Peripheral vascular disease, unspecified: Secondary | ICD-10-CM | POA: Insufficient documentation

## 2009-06-16 DIAGNOSIS — D539 Nutritional anemia, unspecified: Secondary | ICD-10-CM | POA: Insufficient documentation

## 2009-06-16 DIAGNOSIS — I10 Essential (primary) hypertension: Secondary | ICD-10-CM | POA: Insufficient documentation

## 2009-06-16 DIAGNOSIS — I6529 Occlusion and stenosis of unspecified carotid artery: Secondary | ICD-10-CM

## 2009-06-16 DIAGNOSIS — N186 End stage renal disease: Secondary | ICD-10-CM

## 2009-06-22 ENCOUNTER — Encounter (INDEPENDENT_AMBULATORY_CARE_PROVIDER_SITE_OTHER): Payer: Self-pay | Admitting: *Deleted

## 2009-06-24 ENCOUNTER — Ambulatory Visit: Payer: Self-pay | Admitting: Cardiovascular Disease

## 2009-06-24 ENCOUNTER — Ambulatory Visit: Payer: Self-pay

## 2009-07-09 ENCOUNTER — Encounter (HOSPITAL_COMMUNITY): Admission: RE | Admit: 2009-07-09 | Discharge: 2009-10-06 | Payer: Self-pay | Admitting: Nephrology

## 2009-10-08 ENCOUNTER — Encounter (HOSPITAL_COMMUNITY): Admission: RE | Admit: 2009-10-08 | Discharge: 2010-01-06 | Payer: Self-pay | Admitting: Nephrology

## 2009-10-15 ENCOUNTER — Ambulatory Visit (HOSPITAL_COMMUNITY): Admission: RE | Admit: 2009-10-15 | Discharge: 2009-10-15 | Payer: Self-pay | Admitting: Nephrology

## 2010-01-14 ENCOUNTER — Encounter (HOSPITAL_COMMUNITY): Admission: RE | Admit: 2010-01-14 | Discharge: 2010-04-14 | Payer: Self-pay | Admitting: Nephrology

## 2010-04-22 ENCOUNTER — Encounter (HOSPITAL_COMMUNITY)
Admission: RE | Admit: 2010-04-22 | Discharge: 2010-07-21 | Payer: Self-pay | Source: Home / Self Care | Admitting: Nephrology

## 2010-04-29 ENCOUNTER — Ambulatory Visit: Payer: Self-pay | Admitting: Vascular Surgery

## 2010-07-08 ENCOUNTER — Encounter: Payer: Self-pay | Admitting: Cardiovascular Disease

## 2010-07-11 ENCOUNTER — Ambulatory Visit: Payer: Self-pay | Admitting: Cardiovascular Disease

## 2010-07-11 ENCOUNTER — Ambulatory Visit: Payer: Self-pay

## 2010-07-15 DIAGNOSIS — H269 Unspecified cataract: Secondary | ICD-10-CM

## 2010-07-15 HISTORY — DX: Unspecified cataract: H26.9

## 2010-07-22 ENCOUNTER — Encounter (HOSPITAL_COMMUNITY)
Admission: RE | Admit: 2010-07-22 | Discharge: 2010-10-20 | Payer: Self-pay | Source: Home / Self Care | Attending: Nephrology | Admitting: Nephrology

## 2010-09-17 ENCOUNTER — Emergency Department (HOSPITAL_COMMUNITY)
Admission: EM | Admit: 2010-09-17 | Discharge: 2010-09-17 | Payer: Self-pay | Source: Home / Self Care | Admitting: Emergency Medicine

## 2010-10-16 ENCOUNTER — Encounter: Payer: Self-pay | Admitting: General Surgery

## 2010-10-16 ENCOUNTER — Encounter: Payer: Self-pay | Admitting: Internal Medicine

## 2010-10-16 ENCOUNTER — Encounter: Payer: Self-pay | Admitting: Nephrology

## 2010-10-17 LAB — RENAL FUNCTION PANEL
Albumin: 3.6 g/dL (ref 3.5–5.2)
BUN: 59 mg/dL — ABNORMAL HIGH (ref 6–23)
Chloride: 101 mEq/L (ref 96–112)
GFR calc non Af Amer: 10 mL/min — ABNORMAL LOW (ref >60)
Potassium: 4.4 mEq/L (ref 3.5–5.1)
Sodium: 139 mEq/L (ref 135–145)

## 2010-10-17 LAB — IRON AND TIBC
Iron: 85 ug/dL (ref 42–135)
Saturation Ratios: 43 % (ref 20–55)
TIBC: 199 ug/dL — ABNORMAL LOW (ref 250–470)

## 2010-10-18 LAB — POCT HEMOGLOBIN-HEMACUE: Hemoglobin: 9.4 g/dL — ABNORMAL LOW (ref 12.0–15.0)

## 2010-10-25 ENCOUNTER — Encounter
Admission: RE | Admit: 2010-10-25 | Discharge: 2010-10-25 | Payer: Self-pay | Source: Home / Self Care | Attending: Orthopedic Surgery | Admitting: Orthopedic Surgery

## 2010-10-27 NOTE — Assessment & Plan Note (Signed)
Summary: f1y, need ABI/lwb   Visit Type:  1 year follow up Primary Provider:  Johny Chess [facility]  CC:  Legs cramps at night time.  History of Present Illness: This is a 65 year old woman presenting for followup of lower extremity PAD.  She ambulates solely with a walker because of poor balance. She is undergoing physical therapy. Reports main limitation is related to lack of balance.  She complains of episodic calf cramps at night. She denies calf or thigh pain with ambulation. No sores or ulcerations on her feet.  ABI's from 2010  were 0.73 on the right and 0.48 on the left. This morning's ABI's are 0.68 on the right and 0.57 on the left.  Current Medications (verified): 1)  Hydralazine Hcl 50 Mg Tabs (Hydralazine Hcl) .... Take One Tablet By Mouth Three Times A Day 2)  Metoprolol Tartrate 25 Mg Tabs (Metoprolol Tartrate) .... Take One Tablet By Mouth Twice A Day 3)  Plavix 75 Mg Tabs (Clopidogrel Bisulfate) .... Take One Tablet By Mouth Daily 4)  Torsemide 100 Mg Tabs (Torsemide) .... Take 1 Tablet By Mouth Two Times A Day 5)  Gabapentin 100 Mg Caps (Gabapentin) .... Take 1 Capsule By Mouth Three Times A Day 6)  Aspirin 81 Mg Tbec (Aspirin) .... Take One Tablet By Mouth Daily 7)  Lantus 100 Unit/ml Soln (Insulin Glargine) .... As Directed 8)  Novolog 100 Unit/ml Soln (Insulin Aspart) .... Sliding Scale As Directed 9)  Potassium Chloride Crys Cr 20 Meq Cr-Tabs (Potassium Chloride Crys Cr) .... Take One Tablet By Mouth Twice A Day 10)  Calcitriol 0.25 Mcg Caps (Calcitriol) .... Take 1 Capsule By Mouth Once A Day 11)  Pravastatin Sodium 40 Mg Tabs (Pravastatin Sodium) .... Take 1 Tablet By Mouth Once A Day 12)  Famotidine 40 Mg Tabs (Famotidine) .... Take 1 Tablet By Mouth Once A Day 13)  Pantoprazole Sodium 40 Mg Tbec (Pantoprazole Sodium) .... Take 1 Tablet By Mouth Once A Day 14)  Lumigan 0.01 % Soln (Bimatoprost) .... One Drop Each Eye At Bedtime  Allergies: 1)  !  Codeine  Past History:  Past medical history reviewed for relevance to current acute and chronic problems.  Past Medical History: Reviewed history from 06/16/2009 and no changes required. Current Problems:  PERIPHERAL VASCULAR DISEASE (ICD-443.9)- Lower extremity peripheral arterial disease  with intermittent claudication.  CAD (ICD-414.00)- diffuse severe three-vessel CAD  UNSPECIFIED DIASTOLIC HEART FAILURE (ICD-428.30) HYPERTENSION, UNSPECIFIED (ICD-401.9) HYPERLIPIDEMIA (ICD-272.4) PERIPHERAL NEUROPATHY (ICD-356.9) CAROTID STENOSIS (ICD-433.10) END STAGE RENAL DISEASE (ICD-585.6) GERD (ICD-530.81) DIABETES MELLITUS, TYPE II (ICD-250.00) ANEMIA, CHRONIC (ICD-281.9)  Review of Systems       Positive for chronic exertional dyspnea, otherwise negative except as per HPI   Vital Signs:  Patient profile:   65 year old female Height:      64 inches Weight:      177.25 pounds BMI:     30.53 Pulse rate:   60 / minute Pulse rhythm:   regular Resp:     18 per minute BP sitting:   136 / 70  (left arm) Cuff size:   large  Vitals Entered By: Vikki Ports (July 11, 2010 12:18 PM)  Serial Vital Signs/Assessments:  Time      Position  BP       Pulse  Resp  Temp     By           R Arm     140/70  Vikki Ports   Physical Exam  General:  Pt is alert and oriented, in no acute distress. HEENT: normal Neck: normal carotid upstrokes without bruits, JVP normal Lungs: CTA CV: RRR with a 2/6 systolic ejection murmur at the left sternal border Abd: soft, NT, positive BS, no bruit, no organomegaly Ext: no clubbing, cyanosis, or edema. Femoral pulses 2+ with a right femoral bruit. DP pulses 1+, PT nonpalpable Skin: warm and dry without rash    Impression & Recommendations:  Problem # 1:  PERIPHERAL VASCULAR DISEASE (ICD-443.9) Pt with stable PAD by symptoms and ABI's. She has no claudication symptoms at present and remains limited by balance  difficulties. Recommend continue current medical program with a focus on secondary risk reduction. The patient has significant comorbidities and is approaching the need for hemodialysis.  Problem # 2:  HYPERTENSION, UNSPECIFIED (ICD-401.9) Controlled on current regimen.  Her updated medication list for this problem includes:    Hydralazine Hcl 50 Mg Tabs (Hydralazine hcl) .Marland Kitchen... Take one tablet by mouth three times a day    Metoprolol Tartrate 25 Mg Tabs (Metoprolol tartrate) .Marland Kitchen... Take one tablet by mouth twice a day    Torsemide 100 Mg Tabs (Torsemide) .Marland Kitchen... Take 1 tablet by mouth two times a day    Aspirin 81 Mg Tbec (Aspirin) .Marland Kitchen... Take one tablet by mouth daily  BP today: 136/70 Prior BP: 159/77 (06/24/2009)  Patient Instructions: 1)  Your physician recommends that you schedule a follow-up appointment in: 1 year 2)  Your physician recommends that you continue on your current medications as directed. Please refer to the Current Medication list given to you today. 3)  Your physician has requested that you have an ankle brachial index (ABI) in 1 year.During this test an ultrasound and blood pressure cuff are used to evaluate the arteries that supply the arms and legs with blood. Allow thirty minutes for this exam. There are no restrictions or special instructions.

## 2010-10-27 NOTE — Miscellaneous (Signed)
Summary: Orders Update  Clinical Lists Changes  Orders: Added new Test order of Arterial Duplex Lower Extremity (Arterial Duplex Low) - Signed 

## 2010-10-28 ENCOUNTER — Ambulatory Visit: Payer: Medicare Other | Attending: Orthopedic Surgery | Admitting: Physical Therapy

## 2010-10-28 DIAGNOSIS — IMO0001 Reserved for inherently not codable concepts without codable children: Secondary | ICD-10-CM | POA: Insufficient documentation

## 2010-10-28 DIAGNOSIS — M25519 Pain in unspecified shoulder: Secondary | ICD-10-CM | POA: Insufficient documentation

## 2010-10-28 DIAGNOSIS — R5381 Other malaise: Secondary | ICD-10-CM | POA: Insufficient documentation

## 2010-10-28 DIAGNOSIS — M25619 Stiffness of unspecified shoulder, not elsewhere classified: Secondary | ICD-10-CM | POA: Insufficient documentation

## 2010-10-31 ENCOUNTER — Ambulatory Visit: Payer: Medicare Other | Admitting: Physical Therapy

## 2010-11-03 ENCOUNTER — Ambulatory Visit: Payer: Medicare Other | Admitting: Physical Therapy

## 2010-11-04 ENCOUNTER — Encounter (HOSPITAL_COMMUNITY)
Admission: RE | Admit: 2010-11-04 | Discharge: 2010-11-04 | Disposition: A | Discharge status: Home / Self Care | Payer: Medicare Other | Source: Ambulatory Visit | Attending: Nephrology | Admitting: Nephrology

## 2010-11-04 ENCOUNTER — Other Ambulatory Visit: Payer: Self-pay

## 2010-11-04 DIAGNOSIS — D638 Anemia in other chronic diseases classified elsewhere: Secondary | ICD-10-CM | POA: Insufficient documentation

## 2010-11-04 DIAGNOSIS — N184 Chronic kidney disease, stage 4 (severe): Secondary | ICD-10-CM | POA: Insufficient documentation

## 2010-11-07 ENCOUNTER — Ambulatory Visit: Payer: Medicare Other | Admitting: Physical Therapy

## 2010-11-11 ENCOUNTER — Ambulatory Visit: Payer: Medicare Other | Admitting: Physical Therapy

## 2010-11-16 ENCOUNTER — Ambulatory Visit: Payer: Medicare Other | Admitting: Physical Therapy

## 2010-11-22 ENCOUNTER — Ambulatory Visit: Payer: Medicare Other | Admitting: Physical Therapy

## 2010-11-24 ENCOUNTER — Ambulatory Visit: Payer: Medicare Other | Attending: Orthopedic Surgery | Admitting: Physical Therapy

## 2010-11-24 DIAGNOSIS — R5381 Other malaise: Secondary | ICD-10-CM | POA: Insufficient documentation

## 2010-11-24 DIAGNOSIS — M25619 Stiffness of unspecified shoulder, not elsewhere classified: Secondary | ICD-10-CM | POA: Insufficient documentation

## 2010-11-24 DIAGNOSIS — M25519 Pain in unspecified shoulder: Secondary | ICD-10-CM | POA: Insufficient documentation

## 2010-11-24 DIAGNOSIS — IMO0001 Reserved for inherently not codable concepts without codable children: Secondary | ICD-10-CM | POA: Insufficient documentation

## 2010-11-25 ENCOUNTER — Other Ambulatory Visit: Payer: Self-pay

## 2010-11-25 ENCOUNTER — Encounter (HOSPITAL_COMMUNITY): Payer: Medicare Other | Attending: Nephrology

## 2010-11-25 DIAGNOSIS — D638 Anemia in other chronic diseases classified elsewhere: Secondary | ICD-10-CM | POA: Insufficient documentation

## 2010-11-25 DIAGNOSIS — N184 Chronic kidney disease, stage 4 (severe): Secondary | ICD-10-CM | POA: Insufficient documentation

## 2010-11-28 LAB — POCT HEMOGLOBIN-HEMACUE: Hemoglobin: 9.6 g/dL — ABNORMAL LOW (ref 12.0–15.0)

## 2010-11-29 ENCOUNTER — Ambulatory Visit: Payer: Medicare Other | Admitting: Physical Therapy

## 2010-12-01 ENCOUNTER — Ambulatory Visit: Payer: Medicare Other | Admitting: Physical Therapy

## 2010-12-05 LAB — RENAL FUNCTION PANEL
Albumin: 3.5 g/dL (ref 3.5–5.2)
CO2: 24 mEq/L (ref 19–32)
Chloride: 100 mEq/L (ref 96–112)
GFR calc Af Amer: 12 mL/min — ABNORMAL LOW (ref >60)
GFR calc non Af Amer: 10 mL/min — ABNORMAL LOW (ref >60)
Sodium: 135 mEq/L (ref 135–145)

## 2010-12-05 LAB — POCT HEMOGLOBIN-HEMACUE
Hemoglobin: 10.2 g/dL — ABNORMAL LOW (ref 12.0–15.0)
Hemoglobin: 9.1 g/dL — ABNORMAL LOW (ref 12.0–15.0)

## 2010-12-05 LAB — PTH, INTACT AND CALCIUM
Calcium, Total (PTH): 9.5 mg/dL (ref 8.4–10.5)
PTH: 105.3 pg/mL — ABNORMAL HIGH (ref 14.0–72.0)

## 2010-12-05 LAB — IRON AND TIBC: Iron: 85 ug/dL (ref 42–135)

## 2010-12-06 ENCOUNTER — Ambulatory Visit: Payer: Medicare Other | Admitting: Physical Therapy

## 2010-12-07 LAB — PTH, INTACT AND CALCIUM: Calcium, Total (PTH): 9.8 mg/dL (ref 8.4–10.5)

## 2010-12-07 LAB — POCT HEMOGLOBIN-HEMACUE: Hemoglobin: 10.5 g/dL — ABNORMAL LOW (ref 12.0–15.0)

## 2010-12-07 LAB — FERRITIN: Ferritin: 682 ng/mL — ABNORMAL HIGH (ref 10–291)

## 2010-12-07 LAB — RENAL FUNCTION PANEL
Albumin: 3.9 g/dL (ref 3.5–5.2)
CO2: 28 mEq/L (ref 19–32)
Calcium: 9.8 mg/dL (ref 8.4–10.5)
Chloride: 99 mEq/L (ref 96–112)
GFR calc Af Amer: 14 mL/min — ABNORMAL LOW (ref >60)
GFR calc non Af Amer: 12 mL/min — ABNORMAL LOW (ref >60)
Sodium: 136 mEq/L (ref 135–145)

## 2010-12-07 LAB — IRON AND TIBC: Iron: 59 ug/dL (ref 42–135)

## 2010-12-08 ENCOUNTER — Encounter: Payer: Medicare Other | Admitting: Physical Therapy

## 2010-12-08 LAB — IRON AND TIBC: Saturation Ratios: 35 % (ref 20–55)

## 2010-12-08 LAB — RENAL FUNCTION PANEL
Albumin: 3.8 g/dL (ref 3.5–5.2)
BUN: 44 mg/dL — ABNORMAL HIGH (ref 6–23)
Calcium: 9.7 mg/dL (ref 8.4–10.5)
Creatinine, Ser: 3.94 mg/dL — ABNORMAL HIGH (ref 0.4–1.2)
GFR calc Af Amer: 14 mL/min — ABNORMAL LOW (ref >60)
GFR calc non Af Amer: 12 mL/min — ABNORMAL LOW (ref >60)

## 2010-12-08 LAB — FERRITIN: Ferritin: 622 ng/mL — ABNORMAL HIGH (ref 10–291)

## 2010-12-08 LAB — POCT HEMOGLOBIN-HEMACUE: Hemoglobin: 10.3 g/dL — ABNORMAL LOW (ref 12.0–15.0)

## 2010-12-08 LAB — PTH, INTACT AND CALCIUM: Calcium, Total (PTH): 9.9 mg/dL (ref 8.4–10.5)

## 2010-12-09 ENCOUNTER — Other Ambulatory Visit: Payer: Self-pay | Admitting: Nephrology

## 2010-12-09 ENCOUNTER — Encounter (HOSPITAL_COMMUNITY)
Admission: RE | Admit: 2010-12-09 | Discharge: 2010-12-09 | Payer: Medicare Other | Source: Ambulatory Visit | Attending: Nephrology | Admitting: Nephrology

## 2010-12-09 ENCOUNTER — Other Ambulatory Visit: Payer: Self-pay

## 2010-12-09 LAB — RENAL FUNCTION PANEL
CO2: 25 mEq/L (ref 19–32)
Calcium: 9.4 mg/dL (ref 8.4–10.5)
Chloride: 108 mEq/L (ref 96–112)
Creatinine, Ser: 3.92 mg/dL — ABNORMAL HIGH (ref 0.4–1.2)
Glucose, Bld: 234 mg/dL — ABNORMAL HIGH (ref 70–99)
Sodium: 142 mEq/L (ref 135–145)

## 2010-12-09 LAB — IRON AND TIBC
Iron: 62 ug/dL (ref 42–135)
Saturation Ratios: 33 % (ref 20–55)
TIBC: 189 ug/dL — ABNORMAL LOW (ref 250–470)

## 2010-12-09 LAB — POCT HEMOGLOBIN-HEMACUE: Hemoglobin: 11 g/dL — ABNORMAL LOW (ref 12.0–15.0)

## 2010-12-10 LAB — RENAL FUNCTION PANEL
Albumin: 3.7 g/dL (ref 3.5–5.2)
BUN: 47 mg/dL — ABNORMAL HIGH (ref 6–23)
Calcium: 9.3 mg/dL (ref 8.4–10.5)
Chloride: 103 mEq/L (ref 96–112)
Creatinine, Ser: 4.78 mg/dL — ABNORMAL HIGH (ref 0.4–1.2)
GFR calc Af Amer: 11 mL/min — ABNORMAL LOW (ref >60)
GFR calc non Af Amer: 9 mL/min — ABNORMAL LOW (ref >60)
Phosphorus: 5.5 mg/dL — ABNORMAL HIGH (ref 2.3–4.6)

## 2010-12-10 LAB — POCT HEMOGLOBIN-HEMACUE: Hemoglobin: 10.9 g/dL — ABNORMAL LOW (ref 12.0–15.0)

## 2010-12-10 LAB — IRON AND TIBC
Saturation Ratios: 26 % (ref 20–55)
UIBC: 133 ug/dL

## 2010-12-10 LAB — PTH, INTACT AND CALCIUM
Calcium, Total (PTH): 9.6 mg/dL (ref 8.4–10.5)
PTH: 135.1 pg/mL — ABNORMAL HIGH (ref 14.0–72.0)

## 2010-12-11 LAB — PTH, INTACT AND CALCIUM
Calcium, Total (PTH): 9.1 mg/dL (ref 8.4–10.5)
PTH: 74.5 pg/mL — ABNORMAL HIGH (ref 14.0–72.0)

## 2010-12-11 LAB — RENAL FUNCTION PANEL
Albumin: 3.7 g/dL (ref 3.5–5.2)
BUN: 48 mg/dL — ABNORMAL HIGH (ref 6–23)
Calcium: 9.7 mg/dL (ref 8.4–10.5)
Creatinine, Ser: 4.02 mg/dL — ABNORMAL HIGH (ref 0.4–1.2)
GFR calc Af Amer: 14 mL/min — ABNORMAL LOW (ref >60)
GFR calc non Af Amer: 11 mL/min — ABNORMAL LOW (ref >60)

## 2010-12-11 LAB — IRON AND TIBC
Saturation Ratios: 67 % — ABNORMAL HIGH (ref 20–55)
UIBC: 70 ug/dL

## 2010-12-11 LAB — FERRITIN: Ferritin: 927 ng/mL — ABNORMAL HIGH (ref 10–291)

## 2010-12-12 LAB — RENAL FUNCTION PANEL
Calcium: 9.5 mg/dL (ref 8.4–10.5)
GFR calc Af Amer: 12 mL/min — ABNORMAL LOW (ref >60)
GFR calc non Af Amer: 10 mL/min — ABNORMAL LOW (ref >60)
Phosphorus: 5.9 mg/dL — ABNORMAL HIGH (ref 2.3–4.6)
Sodium: 135 mEq/L (ref 135–145)

## 2010-12-12 LAB — POCT HEMOGLOBIN-HEMACUE: Hemoglobin: 11.1 g/dL — ABNORMAL LOW (ref 12.0–15.0)

## 2010-12-12 LAB — PTH, INTACT AND CALCIUM: PTH: 75.6 pg/mL — ABNORMAL HIGH (ref 14.0–72.0)

## 2010-12-12 LAB — IRON AND TIBC
Iron: 94 ug/dL (ref 42–135)
UIBC: 80 ug/dL

## 2010-12-12 LAB — FERRITIN: Ferritin: 772 ng/mL — ABNORMAL HIGH (ref 10–291)

## 2010-12-13 ENCOUNTER — Ambulatory Visit: Payer: Medicare Other | Admitting: Physical Therapy

## 2010-12-13 LAB — FERRITIN: Ferritin: 849 ng/mL — ABNORMAL HIGH (ref 10–291)

## 2010-12-13 LAB — IRON AND TIBC
Saturation Ratios: 44 % (ref 20–55)
TIBC: 218 ug/dL — ABNORMAL LOW (ref 250–470)

## 2010-12-13 LAB — RENAL FUNCTION PANEL
BUN: 48 mg/dL — ABNORMAL HIGH (ref 6–23)
CO2: 31 mEq/L (ref 19–32)
Calcium: 9.6 mg/dL (ref 8.4–10.5)
Glucose, Bld: 130 mg/dL — ABNORMAL HIGH (ref 70–99)
Phosphorus: 5 mg/dL — ABNORMAL HIGH (ref 2.3–4.6)
Potassium: 4.1 mEq/L (ref 3.5–5.1)

## 2010-12-13 LAB — PTH, INTACT AND CALCIUM: PTH: 106.6 pg/mL — ABNORMAL HIGH (ref 14.0–72.0)

## 2010-12-14 LAB — IRON AND TIBC
Saturation Ratios: 66 % — ABNORMAL HIGH (ref 20–55)
TIBC: 178 ug/dL — ABNORMAL LOW (ref 250–470)
UIBC: 60 ug/dL

## 2010-12-14 LAB — RENAL FUNCTION PANEL
Albumin: 3.8 g/dL (ref 3.5–5.2)
BUN: 50 mg/dL — ABNORMAL HIGH (ref 6–23)
Chloride: 97 mEq/L (ref 96–112)
Creatinine, Ser: 4.55 mg/dL — ABNORMAL HIGH (ref 0.4–1.2)
Glucose, Bld: 318 mg/dL — ABNORMAL HIGH (ref 70–99)

## 2010-12-14 LAB — POCT HEMOGLOBIN-HEMACUE: Hemoglobin: 12.1 g/dL (ref 12.0–15.0)

## 2010-12-14 LAB — PTH, INTACT AND CALCIUM: Calcium, Total (PTH): 9.7 mg/dL (ref 8.4–10.5)

## 2010-12-15 ENCOUNTER — Encounter: Payer: Medicare Other | Admitting: Physical Therapy

## 2010-12-18 LAB — RENAL FUNCTION PANEL
BUN: 52 mg/dL — ABNORMAL HIGH (ref 6–23)
CO2: 30 mEq/L (ref 19–32)
Calcium: 9.8 mg/dL (ref 8.4–10.5)
Chloride: 95 mEq/L — ABNORMAL LOW (ref 96–112)
Creatinine, Ser: 4.66 mg/dL — ABNORMAL HIGH (ref 0.4–1.2)
GFR calc Af Amer: 11 mL/min — ABNORMAL LOW (ref >60)
Glucose, Bld: 364 mg/dL — ABNORMAL HIGH (ref 70–99)

## 2010-12-18 LAB — FERRITIN: Ferritin: 900 ng/mL — ABNORMAL HIGH (ref 10–291)

## 2010-12-18 LAB — IRON AND TIBC
Iron: 100 ug/dL (ref 42–135)
Saturation Ratios: 51 % (ref 20–55)
TIBC: 197 ug/dL — ABNORMAL LOW (ref 250–470)
UIBC: 97 ug/dL

## 2010-12-20 ENCOUNTER — Encounter: Payer: Medicare Other | Admitting: Physical Therapy

## 2010-12-22 ENCOUNTER — Encounter: Payer: Medicare Other | Admitting: Physical Therapy

## 2010-12-23 ENCOUNTER — Other Ambulatory Visit: Payer: Self-pay | Admitting: Nephrology

## 2010-12-23 ENCOUNTER — Encounter (HOSPITAL_COMMUNITY): Payer: Medicare Other

## 2010-12-26 LAB — POCT HEMOGLOBIN-HEMACUE: Hemoglobin: 11.6 g/dL — ABNORMAL LOW (ref 12.0–15.0)

## 2010-12-27 LAB — POCT HEMOGLOBIN-HEMACUE: Hemoglobin: 10.4 g/dL — ABNORMAL LOW (ref 12.0–15.0)

## 2010-12-28 LAB — PTH, INTACT AND CALCIUM: Calcium, Total (PTH): 9.9 mg/dL (ref 8.4–10.5)

## 2010-12-28 LAB — IRON AND TIBC
Iron: 46 ug/dL (ref 42–135)
TIBC: 211 ug/dL — ABNORMAL LOW (ref 250–470)

## 2010-12-28 LAB — RENAL FUNCTION PANEL
Albumin: 3.7 g/dL (ref 3.5–5.2)
Chloride: 100 mEq/L (ref 96–112)
Creatinine, Ser: 4.31 mg/dL — ABNORMAL HIGH (ref 0.4–1.2)
GFR calc Af Amer: 13 mL/min — ABNORMAL LOW (ref >60)
GFR calc non Af Amer: 10 mL/min — ABNORMAL LOW (ref >60)
Sodium: 137 mEq/L (ref 135–145)

## 2010-12-28 LAB — HEMOGLOBIN A1C: Hgb A1c MFr Bld: 9 % — ABNORMAL HIGH (ref 4.6–6.1)

## 2010-12-28 LAB — POCT HEMOGLOBIN-HEMACUE: Hemoglobin: 10 g/dL — ABNORMAL LOW (ref 12.0–15.0)

## 2010-12-29 LAB — FERRITIN: Ferritin: 393 ng/mL — ABNORMAL HIGH (ref 10–291)

## 2010-12-29 LAB — PTH, INTACT AND CALCIUM
Calcium, Total (PTH): 9.8 mg/dL (ref 8.4–10.5)
PTH: 51.1 pg/mL (ref 14.0–72.0)

## 2010-12-29 LAB — HEMOGLOBIN A1C: Hgb A1c MFr Bld: 10.5 % — ABNORMAL HIGH (ref 4.6–6.1)

## 2010-12-29 LAB — IRON AND TIBC: Iron: 93 ug/dL (ref 42–135)

## 2010-12-30 LAB — POCT HEMOGLOBIN-HEMACUE: Hemoglobin: 11.8 g/dL — ABNORMAL LOW (ref 12.0–15.0)

## 2010-12-31 LAB — RENAL FUNCTION PANEL
BUN: 57 mg/dL — ABNORMAL HIGH (ref 6–23)
CO2: 29 mEq/L (ref 19–32)
Chloride: 100 mEq/L (ref 96–112)
Creatinine, Ser: 4.76 mg/dL — ABNORMAL HIGH (ref 0.4–1.2)

## 2010-12-31 LAB — POCT HEMOGLOBIN-HEMACUE: Hemoglobin: 11.3 g/dL — ABNORMAL LOW (ref 12.0–15.0)

## 2010-12-31 LAB — IRON AND TIBC
Saturation Ratios: 20 % (ref 20–55)
TIBC: 221 ug/dL — ABNORMAL LOW (ref 250–470)

## 2010-12-31 LAB — FERRITIN: Ferritin: 256 ng/mL (ref 10–291)

## 2010-12-31 LAB — HEMOGLOBIN A1C: Hgb A1c MFr Bld: 6.6 % — ABNORMAL HIGH (ref 4.6–6.1)

## 2011-01-01 LAB — DIFFERENTIAL
Basophils Absolute: 0 10*3/uL (ref 0.0–0.1)
Eosinophils Relative: 1 % (ref 0–5)
Lymphocytes Relative: 15 % (ref 12–46)
Monocytes Absolute: 0.4 10*3/uL (ref 0.1–1.0)

## 2011-01-01 LAB — BASIC METABOLIC PANEL
CO2: 28 mEq/L (ref 19–32)
Chloride: 100 mEq/L (ref 96–112)
GFR calc non Af Amer: 10 mL/min — ABNORMAL LOW (ref >60)
Glucose, Bld: 148 mg/dL — ABNORMAL HIGH (ref 70–99)
Potassium: 4.2 mEq/L (ref 3.5–5.1)
Sodium: 136 mEq/L (ref 135–145)

## 2011-01-01 LAB — RENAL FUNCTION PANEL
BUN: 57 mg/dL — ABNORMAL HIGH (ref 6–23)
Calcium: 9.7 mg/dL (ref 8.4–10.5)
Creatinine, Ser: 4.62 mg/dL — ABNORMAL HIGH (ref 0.4–1.2)
Glucose, Bld: 189 mg/dL — ABNORMAL HIGH (ref 70–99)
Phosphorus: 4.7 mg/dL — ABNORMAL HIGH (ref 2.3–4.6)

## 2011-01-01 LAB — HEMOGLOBIN A1C
Hgb A1c MFr Bld: 6.5 % — ABNORMAL HIGH (ref 4.6–6.1)
Mean Plasma Glucose: 140 mg/dL

## 2011-01-01 LAB — CBC
HCT: 28.5 % — ABNORMAL LOW (ref 36.0–46.0)
Hemoglobin: 9.9 g/dL — ABNORMAL LOW (ref 12.0–15.0)
RDW: 15.7 % — ABNORMAL HIGH (ref 11.5–15.5)

## 2011-01-01 LAB — IRON AND TIBC
Saturation Ratios: 20 % (ref 20–55)
TIBC: 229 ug/dL — ABNORMAL LOW (ref 250–470)

## 2011-01-01 LAB — POCT HEMOGLOBIN-HEMACUE
Hemoglobin: 7.5 g/dL — CL (ref 12.0–15.0)
Hemoglobin: 8.2 g/dL — ABNORMAL LOW (ref 12.0–15.0)

## 2011-01-01 LAB — PTH, INTACT AND CALCIUM: PTH: 88.8 pg/mL — ABNORMAL HIGH (ref 14.0–72.0)

## 2011-01-01 LAB — FERRITIN: Ferritin: 488 ng/mL — ABNORMAL HIGH (ref 10–291)

## 2011-01-02 LAB — RENAL FUNCTION PANEL
Albumin: 3.3 g/dL — ABNORMAL LOW (ref 3.5–5.2)
Calcium: 9.4 mg/dL (ref 8.4–10.5)
Creatinine, Ser: 4.43 mg/dL — ABNORMAL HIGH (ref 0.4–1.2)
GFR calc Af Amer: 12 mL/min — ABNORMAL LOW (ref >60)
GFR calc non Af Amer: 10 mL/min — ABNORMAL LOW (ref >60)
Phosphorus: 5.4 mg/dL — ABNORMAL HIGH (ref 2.3–4.6)

## 2011-01-02 LAB — IRON AND TIBC
Saturation Ratios: 30 % (ref 20–55)
TIBC: 183 ug/dL — ABNORMAL LOW (ref 250–470)

## 2011-01-02 LAB — FERRITIN: Ferritin: 452 ng/mL — ABNORMAL HIGH (ref 10–291)

## 2011-01-04 LAB — RENAL FUNCTION PANEL
Albumin: 3.7 g/dL (ref 3.5–5.2)
Chloride: 102 mEq/L (ref 96–112)
GFR calc Af Amer: 14 mL/min — ABNORMAL LOW (ref >60)
GFR calc non Af Amer: 12 mL/min — ABNORMAL LOW (ref >60)
Potassium: 4.2 mEq/L (ref 3.5–5.1)

## 2011-01-04 LAB — FERRITIN: Ferritin: 337 ng/mL — ABNORMAL HIGH (ref 10–291)

## 2011-01-04 LAB — POCT HEMOGLOBIN-HEMACUE: Hemoglobin: 11.8 g/dL — ABNORMAL LOW (ref 12.0–15.0)

## 2011-01-05 LAB — RENAL FUNCTION PANEL
Albumin: 3.7 g/dL (ref 3.5–5.2)
BUN: 59 mg/dL — ABNORMAL HIGH (ref 6–23)
Chloride: 99 mEq/L (ref 96–112)
Glucose, Bld: 140 mg/dL — ABNORMAL HIGH (ref 70–99)
Potassium: 3.8 mEq/L (ref 3.5–5.1)
Sodium: 132 mEq/L — ABNORMAL LOW (ref 135–145)

## 2011-01-05 LAB — POCT HEMOGLOBIN-HEMACUE: Hemoglobin: 11.8 g/dL — ABNORMAL LOW (ref 12.0–15.0)

## 2011-01-05 LAB — FERRITIN: Ferritin: 473 ng/mL — ABNORMAL HIGH (ref 10–291)

## 2011-01-05 LAB — IRON AND TIBC: Iron: 61 ug/dL (ref 42–135)

## 2011-01-06 ENCOUNTER — Other Ambulatory Visit: Payer: Self-pay | Admitting: Nephrology

## 2011-01-06 ENCOUNTER — Encounter (HOSPITAL_COMMUNITY): Payer: Medicare Other | Attending: Nephrology

## 2011-01-06 DIAGNOSIS — N184 Chronic kidney disease, stage 4 (severe): Secondary | ICD-10-CM | POA: Insufficient documentation

## 2011-01-06 DIAGNOSIS — D638 Anemia in other chronic diseases classified elsewhere: Secondary | ICD-10-CM | POA: Insufficient documentation

## 2011-01-06 LAB — IRON AND TIBC
Saturation Ratios: 24 % (ref 20–55)
UIBC: 143 ug/dL

## 2011-01-06 LAB — RENAL FUNCTION PANEL
BUN: 56 mg/dL — ABNORMAL HIGH (ref 6–23)
CO2: 25 mEq/L (ref 19–32)
Chloride: 94 mEq/L — ABNORMAL LOW (ref 96–112)
Creatinine, Ser: 4.48 mg/dL — ABNORMAL HIGH (ref 0.4–1.2)
Glucose, Bld: 412 mg/dL — ABNORMAL HIGH (ref 70–99)

## 2011-01-09 LAB — RENAL FUNCTION PANEL
Albumin: 3.6 g/dL (ref 3.5–5.2)
BUN: 54 mg/dL — ABNORMAL HIGH (ref 6–23)
Chloride: 100 mEq/L (ref 96–112)
Glucose, Bld: 76 mg/dL (ref 70–99)
Potassium: 4.8 mEq/L (ref 3.5–5.1)
Sodium: 138 mEq/L (ref 135–145)

## 2011-01-09 LAB — POCT HEMOGLOBIN-HEMACUE
Hemoglobin: 11.3 g/dL — ABNORMAL LOW (ref 12.0–15.0)
Hemoglobin: 10.5 g/dL — ABNORMAL LOW (ref 12.0–15.0)

## 2011-01-09 LAB — PTH, INTACT AND CALCIUM: PTH: 237 pg/mL — ABNORMAL HIGH (ref 14.0–72.0)

## 2011-01-10 LAB — POCT HEMOGLOBIN-HEMACUE
Hemoglobin: 8.1 g/dL — ABNORMAL LOW (ref 12.0–15.0)
Hemoglobin: 8.5 g/dL — ABNORMAL LOW (ref 12.0–15.0)
Hemoglobin: 8.6 g/dL — ABNORMAL LOW (ref 12.0–15.0)

## 2011-01-10 LAB — RENAL FUNCTION PANEL
Calcium: 9.4 mg/dL (ref 8.4–10.5)
GFR calc Af Amer: 12 mL/min — ABNORMAL LOW (ref >60)
GFR calc non Af Amer: 10 mL/min — ABNORMAL LOW (ref >60)
Glucose, Bld: 191 mg/dL — ABNORMAL HIGH (ref 70–99)
Phosphorus: 5.4 mg/dL — ABNORMAL HIGH (ref 2.3–4.6)
Potassium: 4.3 mEq/L (ref 3.5–5.1)
Sodium: 136 mEq/L (ref 135–145)

## 2011-01-10 LAB — PTH, INTACT AND CALCIUM: PTH: 172 pg/mL — ABNORMAL HIGH (ref 14.0–72.0)

## 2011-01-10 LAB — IRON AND TIBC
Iron: 37 ug/dL — ABNORMAL LOW (ref 42–135)
TIBC: 225 ug/dL — ABNORMAL LOW (ref 250–470)

## 2011-01-19 ENCOUNTER — Encounter (HOSPITAL_COMMUNITY): Payer: Medicare Other

## 2011-01-19 ENCOUNTER — Other Ambulatory Visit: Payer: Self-pay | Admitting: Nephrology

## 2011-01-20 ENCOUNTER — Encounter (HOSPITAL_COMMUNITY): Payer: Medicare Other

## 2011-01-20 LAB — POCT HEMOGLOBIN-HEMACUE: Hemoglobin: 11.8 g/dL — ABNORMAL LOW (ref 12.0–15.0)

## 2011-02-03 ENCOUNTER — Other Ambulatory Visit: Payer: Self-pay | Admitting: Nephrology

## 2011-02-03 ENCOUNTER — Encounter (HOSPITAL_COMMUNITY): Payer: Medicare Other | Attending: Nephrology

## 2011-02-03 DIAGNOSIS — D638 Anemia in other chronic diseases classified elsewhere: Secondary | ICD-10-CM | POA: Insufficient documentation

## 2011-02-03 DIAGNOSIS — N184 Chronic kidney disease, stage 4 (severe): Secondary | ICD-10-CM | POA: Insufficient documentation

## 2011-02-03 LAB — RENAL FUNCTION PANEL
Albumin: 3.4 g/dL — ABNORMAL LOW (ref 3.5–5.2)
BUN: 39 mg/dL — ABNORMAL HIGH (ref 6–23)
Calcium: 10 mg/dL (ref 8.4–10.5)
Creatinine, Ser: 3.07 mg/dL — ABNORMAL HIGH (ref 0.4–1.2)
Glucose, Bld: 194 mg/dL — ABNORMAL HIGH (ref 70–99)
Phosphorus: 6.1 mg/dL — ABNORMAL HIGH (ref 2.3–4.6)
Potassium: 3.7 mEq/L (ref 3.5–5.1)

## 2011-02-03 LAB — FERRITIN: Ferritin: 812 ng/mL — ABNORMAL HIGH (ref 10–291)

## 2011-02-03 LAB — IRON AND TIBC: UIBC: 126 ug/dL

## 2011-02-06 LAB — PTH, INTACT AND CALCIUM
Calcium, Total (PTH): 9.4 mg/dL (ref 8.4–10.5)
PTH: 172.4 pg/mL — ABNORMAL HIGH (ref 14.0–72.0)

## 2011-02-07 NOTE — Op Note (Signed)
[name], [name]                 ACCOUNT NO.:  [id number]   MEDICAL RECORD NO.:  [id number]          PATIENT TYPE:  INP   LOCATION:  3309                         FACILITY:  [facility]   PHYSICIAN:  Janetta Hora. Fields, MD  DATE OF BIRTH:  12-27-1945   DATE OF PROCEDURE:  07/15/2007  DATE OF DISCHARGE:                               OPERATIVE REPORT   PROCEDURE:  Left carotid endarterectomy.   PREOPERATIVE DIAGNOSIS:  High-grade asymptomatic left internal carotid  artery stenosis.   POSTOPERATIVE DIAGNOSIS:  High-grade asymptomatic left internal carotid  artery stenosis.   ANESTHESIA:  General.   ASSISTANT:  Gae Bon, PA-C   OPERATIVE FINDINGS:  1. Greater than 80% left internal carotid artery stenosis.  2. 10-French shunt.  3. Dacron patch.   SPECIMENS:  Left carotid plaque.   OPERATIVE DETAIL:  After obtaining informed consent, the patient taken  to the operating room.  The patient placed in supine position on  operating table.  After induction of general anesthesia and endotracheal  intubation, the patient's entire left neck and chest were prepped and  draped in the usual sterile fashion.  Next an oblique incision was made  on the left side of the neck just anterior to the sternocleidomastoid  muscle.  Incision was carried down through subcutaneous tissues and  platysma.  Dissection was carried along the anterior border left  sternocleidomastoid muscle down level left internal jugular vein.  Common facial vein was dissected free circumferentially ligated and  divided between silk ties.  Common carotid artery was identified at base  of the wound, dissected free circumferentially.  Vagus nerve was  identified and protected.  Dissection was carried up to level of the  carotid bifurcation.  The superior thyroid and external carotid arteries  were dissected free circumferentially and vessel loops placed around  these.  Dissection was then carried up to the level hypoglossal  nerve.  The nerve was identified with the ansa insertion into it.  It was also  protected.  The internal carotid artery was dissected free  circumferentially at this level which was above the area of disease.  A  vessel loop was placed around this.  The patient was then given 7000  units of intravenous heparin.  It should also be noted that the patient  was also given an additional 3000 units of heparin during the course of  the case.  Next after appropriate circulation time, the distal internal  carotid artery was controlled with fine bulldog clamp.  External  carotid, superior thyroid was controlled with vessel loops and common  carotid artery controlled with peripheral DeBakey clamp.  Longitudinal  arteriotomy was then made in the carotid artery just below the  bifurcation.  Arteriotomy was extended up through a very heavily  calcified greater than 80% plaque in the internal carotid artery.  Arteriotomy was extended up past the level of disease.  The proximal  common carotid artery is fairly thickened and this arteriotomy was  extended proximally until a reasonable portion of thinned out plaque was  obtained.  Next a 10-French shunt was brought up  to the operative field.  This was threaded into the distal internal carotid artery and allowed to  back bleed thoroughly.  Shunt was then threaded down in the common  carotid artery and inspected for air.  There was one small air bubble so  the shunt was removed from the proximal aspect and allowed to back  bleeding again thoroughly.  It was then placed back down to the common  carotid artery.  It was secured in place with a Rumel tourniquet.  The  shunt was again inspected and found to be free of air and flow was  restored to the brain after approximate six minutes of ischemia time.  The shunt was back bleeding some from around the shunt distally so a  shunt clamp was used to control this.  Next, endarterectomy was begun in  a suitable plane  in the common carotid artery.  Eversion technique was  used to endarterectomize the external carotid artery.  A good endpoint  was obtained on the distal internal carotid artery.  All loose debris  was then removed from the carotid artery.  This was thoroughly irrigated  with heparinized saline.  One 7-0 tacking suture was placed in the  proximal common carotid artery to tack down a very thinned out plaque  proximally.  Next a Dacron patch was brought to the operative field and  sewn on as patch angioplasty using a running 6-0 Prolene suture.  Just  prior to completion anastomosis everything was back bled, fore bled and  thoroughly flushed.  Anastomosis was secured the external carotid artery  was opened and allowed to back bleed and deair the patch.  Common  carotid artery was then opened and flow restored to the external carotid  artery first.  After approximately five to seven cardiac cycles, flow  was restored to the internal carotid artery.  Doppler was used to  inspect the carotid artery.  There was good flow through the internal,  external and common carotid arteries.  Hemostasis was obtained with a  few repair sutures towards the distal end of the patch.  Thrombin and  Gelfoam was applied.  The patient was also given 50 mg of protamine  partially reverse the heparin.  Hemostasis was obtained.  Platysma  muscle was reapproximated using running 3-0 Vicryl suture.  Skin was  closed with 4-0 Vicryl subcuticular stitch.  The patient tolerated  procedure well and there were no complications.  Instrument, sponge and  needle counts were correct at end of the case.  The patient taken to  recovery room in stable condition.      Janetta Hora. Fields, MD  Electronically Signed     CEF/MEDQ  D:  07/15/2007  T:  07/16/2007  Job:  [id number]

## 2011-02-07 NOTE — Op Note (Signed)
NAMEVICKYE, [name]                 ACCOUNT NO.:  [id number]   MEDICAL RECORD NO.:  [id number]          PATIENT TYPE:  OIB   LOCATION:  2550                         FACILITY:  [facility]   PHYSICIAN:  Beulah Gandy. Ashley Royalty, M.D. DATE OF BIRTH:  1946-05-19   DATE OF PROCEDURE:  11/28/2007  DATE OF DISCHARGE:                               OPERATIVE REPORT   ADMISSION DIAGNOSES:  1. Complex traction retinal detachment.  2. Proliferative diabetic retinopathy.  3. Vitreous hemorrhage in the right eye.   PROCEDURES:  Repair of complex traction retinal detachment with pars  plana vitrectomy, panretinal photocoagulation, membrane peel, gas fluid  exchange, Endodiathermy in the right eye.   SURGEON:  Beulah Gandy. Ashley Royalty, M.D.   ASSISTANT:  Rosalie Doctor, MA.   ANESTHESIA:  General.   DESCRIPTION OF PROCEDURE:  Usual prep and drape, 25-gauge trocars at 8,  10 and 2 o'clock.  Provisc placed on the corneal surface and the biome  viewing system was moved into place.  Infusion at 8 o'clock.  The  lighted pick and cutter were placed a 10 at 2 o'clock, respectively.  Pars plana vitrectomy was begun just behind the cataractous lens.  Vitreous traction detachment was extensive and extended from the  inferior arcade to the disk, to the inferior nasal and inferior temporal  arcades up to the superior temporal and to the superior nasal arcades.  The traction detachment extended into the macular region with three  areas of adhesion to the macular region.  All of this traction  detachment was peeled with 25-gauge forceps, the lighted pick and the  vitreous cutter.  A large stump of vitreous was removed from the disk.  Endo cautery was used for hemostasis in the areas of traction  detachment.  Once all traction was relieved, scleral depression was used  to gain access to the vitreous base and the vitreous base was trimmed  with the vitreous cutter.  Endolaser was positioned in the eye, 860  burns placed around the  retinal periphery.  The power was 1000  milliwatts, 1000 microns each and 0.1 seconds each.  An 80% gas fluid  exchange was carried out.  The instruments were removed from the eye and  the trocars were removed from the eye.  The wounds were held until  tight.  Atropine solution was applied.  Polymyxin and gentamicin were  irrigated into tenon space.  Marcaine was injected around the globe for  postop pain.  Decadron 10 mg was injected into the lower subconjunctival  space.  TobraDex ophthalmic ointment, a patch and shield were placed.  The closing pressure was 10 with a Barraquer tonometer.   COMPLICATIONS:  None.   DURATION:  45 minutes.   The patient is awakened and taken to recovery in satisfactory condition.      Beulah Gandy. Ashley Royalty, M.D.  Electronically Signed     JDM/MEDQ  D:  11/28/2007  T:  11/28/2007  Job:  [id number]

## 2011-02-07 NOTE — Assessment & Plan Note (Signed)
[facility]                            CARDIOLOGY OFFICE NOTE   NAME:Dunnam, SAVITA RUNNER                        MRN:          [id number]  DATE:08/28/2007                            DOB:          1945-10-30    PRIMARY CARE PHYSICIAN:  Lindaann Pascal, PAC at [facility]   REASON FOR PRESENTATION:  Evaluate patient with coronary disease and  lower extremity edema.   HISTORY OF PRESENT ILLNESS:  The patient is a pleasant 65 year old with  significant vascular disease. She was seen in September by Dr. Eden Emms.  Since that time, she had a left carotid endarterectomy on October 20th.  This was apparently without any problems. She has a chronic dyspnea when  she gets up to do much. She is inactive because of leg pain and lower  extremity edema. She does not describe any resting shortness of breath.  She does not describe any PND or orthopnea. The exercise dyspnea is a  chronic problem. She does not have any chest discomfort, neck or arm  discomfort. She has no palpitations, pre-syncope or syncope. Her most  significant complaint is lower extremity edema. She is taking Lasix for  this, but continues to have left greater than right edema. She does have  her feet on the floor most of the day. She does watch salt. She does not  wear compression stockings routinely though these have been prescribed.   PAST MEDICAL HISTORY:  1. Coronary artery disease (catheterization in 2006 demonstrated mild      diffuse left main disease, the left anterior descending artery had      70% proximal, 70% mid and 90% distal stenosis and was heavily      calcified. Diagonal branches were severely diseased with a 90%      first diagonal stenosis, circumflex had 90%, 80% stenosis in each      of the major branches and 90% in the mid portion of the AV groove;      the right coronary artery had 40-50% stenosis in the mid vessel and      80% stenosis in the posterior  descending artery and 90% stenosis in      the posterolateral. The ejection fraction was 55%. The patient was      deemed to have poor targets for revascularization and so was      managed medically.)  2. Renal insufficiency.  3. Peripheral vascular disease status post left carotid      endarterectomy.  4. Chronic lower extremity pain, improved on gabapentin.  5. Diastolic heart failure.  6. Diabetes mellitus.  7. Hypertension.  8. Dyslipidemia.  9. Chronic anemia.  10.Gastroesophageal reflux disease.   ALLERGIES:  CODEINE.   CURRENT MEDICATIONS:  1. Plavix 75 mg daily.  2. Furosemide 40 mg b.i.d.  3. Aspirin 81 mg daily.  4. Lantus.  5. Novolin.  6. Prilosec 20 mg daily.  7. Pravastatin 80 mg daily.  8. Potassium 20 mEq daily.  9. Hydralazine 25 mg t.i.d.  10.Metoprolol 25 mg b.i.d.  11.Gabapentin 300 mg t.i.d.  12.Zetia 10  mg daily.   REVIEW OF SYSTEMS:  As stated in the HPI and otherwise negative for  other systems.   PHYSICAL EXAMINATION:  The patient is in no distress. Blood pressure  166/92, heart rate 57 and regular. Weight 187 pounds. Body Mass Index is  29.  HEENT: Eyelids unremarkable. Pupils equal, round, and reactive to light.  Fundi not visualized. Oral mucosa is unremarkable.  NECK: No jugular venous distention at 90 degrees (the patient seated in  the chair during the examination). Carotid upstroke brisk and  symmetrical. Status post well-healed left carotid endarterectomy scar.  No thyromegaly.  LYMPHATICS: No cervical, axillary adenopathy.  LUNGS: Clear to auscultation and percussion bilaterally.  BACK: No costovertebral angle tenderness.  CHEST: I cannot appreciate the PMI as the patient is seated.  HEART: S1, S2 within normal limits. No S3. No S4. No clicks, rub or  murmurs.  ABDOMEN: Obese, positive bowel sounds, normal in frequency and pitch.  Unable to appreciate bruits. No rebounds. No guarding. No midline  pulsatile mass. No organomegaly with  the patient seated.  SKIN: No rashes, no nodules.  EXTREMITIES: 2+ upper pulses. Unable to access the femorals. Unable to  appreciate dorsalis pedis and posterior tibialis secondary to severe  edema. Bilateral severe pitting edema to the knees.  NEURO: Oriented to person, place and time. Cranial nerves II-XII grossly  intact. Motor grossly intact.   EKG: Sinus bradycardia, rate 57, probable old inferior infarct. Probable  old anterior infarct, anterolateral T-wave inversions, nonspecific.   ASSESSMENT/PLAN:  1. Coronary disease. The patient is not having any overt angina. She      is being managed aggressively with medications and secondary risk      reduction. No further cardiovascular testing is suggested.  2. Edema. This is the patient's most pressing complaint. She has      significant renal insufficiency making further diuretic adjustment      difficult. She keeps her feet down on the ground most of the day.      She does not wear compression stockings. We had a long discussion      about this. She can wear the compression stockings. Her husband can      put them on for her. She can keep her feet elevated and I      instructed her how to do this above her heart. She will remain on      the current dose of diuretic and will see if more conservative      therapy helps over time.  3. Lower extremity pain. She tells me that she recently had some lower      extremity studies and was told there was no blockage. I will try      to get a copy of this. She is seeing Dr. Darrick Penna and there was no      mention of peripheral vascular or lower extremity disease. She is      much better with the gabapentin so there is clearly some element of      neuropathy. She will be managed symptomatically for this.  4. Hypertension. Blood pressure is elevated at most recent visit here.      I have asked her to keep a blood pressure diary four times a week.      She can share this with either Mr. Jacqulyn Bath, Dr.  Kathrene Bongo or      myself.  5. Followup. I will see the patient back in about six months or sooner  if needed.     Rollene Rotunda, MD, [facility]  Electronically Signed    JH/MedQ  DD: 08/28/2007  DT: 08/28/2007  Job #: [id number]   cc:   Lindaann Pascal, PAC

## 2011-02-07 NOTE — Assessment & Plan Note (Signed)
[facility]                            CARDIOLOGY OFFICE NOTE   NAME:[name], [name]                        MRN:          [id number]  DATE:02/26/2008                            DOB:          [birth year]    PRIMARY CARE PHYSICIAN:  Lindaann Pascal, PA, [facility].   REASON FOR PRESENTATION:  Evaluate patient with coronary disease.   HISTORY OF PRESENT ILLNESS:  The patient is 65 years old.  She presents  for follow-up of her known coronary disease.  She also has lower  extremity edema and at the last appointment we talked about conservative  therapy to manage this.  She has longstanding leg pain which is felt to  be neuropathic pain.  This is continued.  It is not really changed.  She  is limited in her activities, but does get around with a walker.  Her  lower extremity swelling is unchanged.  She does not have any new chest  discomfort, neck or arm discomfort.  She has had no new palpitations,  presyncope or syncope.  She has not been having any PND or orthopnea.   PAST MEDICAL HISTORY:  1. Coronary artery disease (see the December 2008 note for details).  2. Well-preserved ejection fraction.  3. Renal insufficiency.  4. Peripheral vascular disease status post left carotid      endarterectomy.  5. Chronic lower extremity pain.  6. Diastolic heart failure.  7. Diabetes mellitus.  8. Hypertension.  9. Dyslipidemia.  10.Chronic anemia (the patient is due to have iron infusions).  11.Gastroesophageal reflux disease.   ALLERGIES:  CODEINE.   MEDICATIONS:  1. Vitamin K.  2. Zetia 10 mg daily.  3. Gabapentin.  4. Metoprolol 25 mg b.i.d.  5. Hydralazine 25 mg t.i.d.  6. Pravastatin 80 mg daily.  7. Prilosec.  8. Novolin.  9. Lantus.  10.Furosemide 40 mg b.i.d.  11.Plavix 75 mg daily.   REVIEW OF SYSTEMS:  As stated in the HPI and otherwise negative for  other systems.   PHYSICAL EXAMINATION:  GENERAL:  The patient is  in no distress.  VITAL SIGNS:  Blood pressure 188/102, heart rate 64 and regular, weight  184 pounds.  HEENT:  Eyes unremarkable.  Pupils equal, round, reactive to light.  Fundi not visualized, oral mucosa unremarkable.  NECK:  No jugular distention at 45 degrees, carotid upstroke brisk and  symmetric, status post well-healed left carotid endarterectomy scar, no  thyromegaly.  LYMPHATICS:  No cervical, axillary or inguinal adenopathy.  LUNGS:  Clear to auscultation bilaterally.  BACK:  No costovertebral angle tenderness.  CHEST:  Unremarkable.  HEART:  PMI not displaced or sustained, S1-S2 within normal limits, no  S3-S4, no clicks, rubs, murmurs.  ABDOMEN:  Obese, positive bowel sounds, normal in frequency and pitch,  no bruits, rebound, guarding or midline pulsatile mass, no hepatomegaly  or splenomegaly.  SKIN:  No rash, no nodules.  EXTREMITIES:  Upper pulses 2+, absent dorsalis pedis and posterior  tibialis bilaterally, absent popliteals, left greater than right  bilateral moderate lower extremity edema.  NEUROLOGICAL:  Oriented to person, place, time.  Cranial nerves II-XII  grossly intact, motor grossly intact.   ASSESSMENT/PLAN:  1. Coronary disease.  The patient is having no new symptoms related to      this.  No further cardiovascular testing is suggested.  She will      continue with risk reduction.  2. Hypertension.  Blood pressure is not well-controlled.  This is the      second appointment in a row.  I am going to increase her      hydralazine to 50 mg t.i.d.  3. Lower extremity edema.  Again, I would suggest conservative      management of this.  She is not wearing compression stockings      though we talked about this.  She will remain on the current dose      of diuretic.  4. Peripheral vascular disease.  She is status post carotid      endarterectomy.  I suspect lower extremity vascular disease as      well.  I thought she might have had this evaluated by Dr.  Darrick Penna in      the past, but on further questioning it seems like she has had      venous Dopplers.  I do not know that she has had an arterial exam.      Her leg pain is probably neuropathic, but could in part be related      to peripheral vascular disease.  Therefore, I will screen her with      ABIs.  5. Followup.  I will see her back in 6 months or sooner based on      results of the above or further symptoms.     Rollene Rotunda, MD, [facility]  Electronically Signed    JH/MedQ  DD: 02/26/2008  DT: 02/26/2008  Job #: [id number]   cc:   [facility] [place], [place]

## 2011-02-07 NOTE — Assessment & Plan Note (Signed)
OFFICE VISIT   KESI, PERROW  DOB:  July 20, 1946                                       07/03/2007  BJYNW#:[id number]   The patient is a 65 year old female who recently was being evaluated by  Dr. Ashley Royalty for an eye procedure.  She had a preoperative carotid  duplex, which showed a high-grade carotid stenosis.  Her eye procedure  has been put on hold until this could be evaluated further.  She  previously was known to have a moderate carotid stenosis, and had been  seen by Dr. Madilyn Fireman as recently as 2003.  Since that time, she had not  been seen in our office.  She denies any symptoms of TIA, stroke, or  amaurosis.  Her atherosclerotic risk factors include elevated  cholesterol, diabetes, hypertension, coronary artery disease, and  smoking, although this was a long time ago.  She had been seen  previously by Dr. Vida Roller for cardiac evaluation and found to  have diffuse 3-vessel coronary artery disease and was felt not to be a  candidate for cardiac revascularization by Dr. Donata Clay.   PAST MEDICAL HISTORY:  Also significant for renal insufficiency.  She is  followed by Dr. Kathrene Bongo for this.  She states that Dr. Kathrene Bongo  said she had 20% of her renal function.  She also has a history of  congestive heart failure.   She has had no prior operations, except for her eye procedures.   MEDICATIONS:  1. Lantus insulin 100 units twice a day.  2. Omeprazole 20 mg once a day.  3. Pravastatin 80 mg once a day.  4. Actos 15 mg once a day.  5. Plavix 75 mg once a day.  6. Lasix 40 mg twice a day.  7. Hydralazine 25 mg 3 times a day.  8. Aspirin 81 mg once a day.  9. Gabapentin 30 mg 3 times a day.  10.NovoLog sliding scale.   She has a side effect from codeine that makes her crazy.   FAMILY HISTORY:  Unremarkable.   SOCIAL HISTORY:  She is single and has 3 children.  Quit smoking 35  years ago.  She does not drink alcohol on a regular  basis.   REVIEW OF SYSTEMS:  She has had some recent weight gain.  She is 5 feet  6, 171 pounds.  She has some dyspnea when lying flat.  She also has a history of a heart murmur.  She denies history of GI bleeding.  She has some pain in her legs with walking and has chronic leg swelling.  She also has some occasional dizziness and poor balance.  She has had  some decrease in her visual acuity recently.   PHYSICAL EXAM:  Blood pressure 183/82 in the left arm, 200/88 in the  right arm.  HEENT is unremarkable.  She has 2+ carotid pulses with a  left carotid bruit.  Chest is clear to auscultation.  Cardiac exam is  regular rate and rhythm with no murmurs appreciated.  Abdomen is soft,  nontender, nondistended.  She has 2+ radial and femoral pulses  bilaterally.  She has significant edema from the knees to the feet.  The  feet are pink, warm, and adequately perfused.  The edema is 1 to 2+.  Neurologic exam shows symmetric upper extremity and lower extremity  motor  strength, which is 5/5.   She had a carotid duplex exam on October 6 at [facility].  This  showed a peak systolic velocity in the left internal carotid artery at  314 cm per second with an IC to CC ratio of 4.92.  We repeated her  carotid duplex exam today.  This showed a peak systolic velocity on the  left side of 292 cm per second with an IC to CC ratio of greater than 4.  Right side had minimal stenosis less than 40%.  The left internal  carotid artery is also fairly torturous, and there was calcification  suggesting the stenosis may be even more severe.   I believe the patient would benefit from a carotid endarterectomy for  stroke prophylaxis.  She will be of moderate risk from a cardiac  standpoint due to her severe un-reconstructible coronary disease and  previous history of congestive heart failure.  However, I do believe  that her overall operative mortality and morbidity risk is still less  than 5%, which I  would place her at least at a 5% stroke risk over the  next year with this level of stenosis.  I discussed with her the  surgical procedure risks, benefits, and possible complications,  including the cardiac and cerebrovascular  complications.  She understands and agrees to proceed.  We have  scheduled a left carotid endarterectomy for her on October 20.   Janetta Hora. Fields, MD  Electronically Signed   CEF/MEDQ  D:  07/03/2007  T:  07/04/2007  Job:  [id number]   cc:   Beulah Gandy. Ashley Royalty, M.D.  Ernestina Penna, M.D.

## 2011-02-07 NOTE — Assessment & Plan Note (Signed)
OFFICE VISIT   ADEN, YOUNGMAN  DOB:  02/12/1946                                       04/29/2010  NUUVO#:[id number]   [name] presents today for evaluation of an occluded left upper arm  AV Gore-Tex graft.  She underwent placement of this left upper arm graft  by Dr. Waverly Ferrari in October 2009.  She is still not on  hemodialysis.  I do not have her laboratory data but apparently has been  told by Dr. Kathrene Bongo that her kidney function is stable..  She has  had 2 separate episodes of thrombolysis in interventional radiology for  this graft.  It has re-occluded.   On physical exam, she does have an occluded arm graft.  She has 1+  radial pulses bilaterally and very poor surface veins..  I had a long  discussion with Mr. And Mrs.  [name].  I explained that she is now nearly  2 years out from placement of her graft and has not had need for this.  I do not feel that she has the option for revision of this graft.  Her  next option would be new graft placement.  I certainly would not  recommend this at this time if her renal function is stable.  I would  consider her a new renal insufficiency type patient and would put a new  graft if we feel that she is in imminent need for this as she is not a  fistula candidate.  She understands this and will see [place] again on an as-  needed basis.  We will request that she is referred back to [place] should  she have progressive renal insufficiency requiring hemodialysis access.  I did explain to [name] that she is acceptable to use her left arm  for IV therapy, blood draws and blood pressure checks.     Larina Earthly, M.D.  Electronically Signed   TFE/MEDQ  D:  04/29/2010  T:  04/29/2010  Job:  [id number]

## 2011-02-07 NOTE — Consult Note (Signed)
VASCULAR SURGERY CONSULTATION   [name], [name]  DOB:  May 03, 1946                                       06/23/2008  BJYNW#:[id number]   I saw the patient in the office today in consultation for hemodialysis  access.  She was referred by Dr. Kathrene Bongo.  This is a pleasant 65-  year-old right-handed woman with end-stage renal disease secondary to  diabetes who has not yet begun dialysis.  She was referred for access.  She has had no recent nausea, vomiting or weakness.   PAST MEDICAL HISTORY:  Significant for end-stage renal disease,  diabetes, hypertension and history of congestive heart failure.  She is  followed by Dr. Rollene Rotunda.   FAMILY HISTORY:  There is no history of premature cardiovascular  disease.   SOCIAL HISTORY:  She is widowed.  She has three children.  She does not  use tobacco.   REVIEW OF SYSTEMS AND MEDICATIONS:  Are documented on the medical  history form in her chart.   PHYSICAL EXAMINATION:  Vital signs:  Blood pressure 149/79, heart rate  61.  Lungs:  Clear bilaterally to auscultation.  Cardiac:  She has a  regular rate and rhythm.  She has palpable brachial and radial pulses  bilaterally.  I do not see a usable forearm cephalic vein on either  side.  She has no significant upper extremity swelling.   Her vein mapping in the left arm shows a marginally sized cephalic vein  in the forearm and upper arm.   I have explained that we can explore her cephalic vein on the left and  if this is adequate we could potentially place a fistula.  More likely,  however, we would likely have to place an AV graft as the vein looks  quite small.  We have discussed the indications for the procedure,  potential complications including but not limited to failure of the  fistula to mature, graft thrombosis, graft infection, steal syndrome,  arm swelling, bleeding and wound healing problems.  All of her questions  were answered.  She is agreeable to  proceed.  Her surgery has been  scheduled for 07/03/2008.   [name]. Edilia Bo, M.D.  Electronically Signed  CSD/MEDQ  D:  06/23/2008  T:  06/24/2008  Job:  [id number]   cc:   Cecille Aver, M.D.

## 2011-02-07 NOTE — Assessment & Plan Note (Signed)
OFFICE VISIT   JULIONA, VALES  DOB:  March 03, 1946                                       07/21/2008  ZOXWR#:[id number]   I saw the patient in the office today for followup after placement of a  new left upper arm AV graft on 07/03/2008.  Since her graft was placed,  her only complaint has been some occasional paresthesias in the left  hand which seems to be improving.  She has had no fever or chills.   EXAMINATION:  The incisions are healing nicely.  She has an excellent  thrill in her graft and a palpable radial pulse.  The graft appears to  functioning nicely.  Will see her back p.r.n.   [name]. Edilia Bo, M.D.  Electronically Signed   CSD/MEDQ  D:  07/21/2008  T:  07/22/2008  Job:  [id number]

## 2011-02-07 NOTE — Op Note (Signed)
[name], [name]                 ACCOUNT NO.:  [id number]   MEDICAL RECORD NO.:  [id number]          PATIENT TYPE:  AMB   LOCATION:  SDS                          FACILITY:  [facility]   PHYSICIAN:  [name]. Edilia Bo, M.D.DATE OF BIRTH:  03-Nov-1945   DATE OF PROCEDURE:  07/03/2008  DATE OF DISCHARGE:  07/03/2008                               OPERATIVE REPORT   PREOPERATIVE DIAGNOSIS:  End-stage renal disease.   POSTOPERATIVE DIAGNOSIS:  End-stage renal disease.   PROCEDURE:  Placement of new left upper arm arteriovenous graft.   SURGEON:  [name]. Edilia Bo, MD   ASSISTANT:  Johny Shears, RNFA   ANESTHESIA:  Local with sedation.   TECHNIQUE:  The patient was taken to the operating room and sedated by  anesthesia.  The left upper extremity was prepped and draped in the  usual sterile fashion.  After the skin was infiltrated with 1%  lidocaine, I made an incision over the cephalic vein at the antecubital  level.  It was ligated distally and irrigated up with saline, it was  only about a 3-mm vein.  For this reason, I did not think it was  adequate for a fistula.  The brachial artery was of a reasonable size  and had a good pulse.  However, the adjacent veins were quite small and  the basilic vein was quite small.  For this reason, I elected to place  an upper arm graft.  A separate longitudinal incision was made beneath  the axilla after the skin was anesthetized.  Here the high brachial vein  was dissected free.  After the skin was anesthetized, a 4-7 mm tapered  PTFE graft was tunneled between the two incisions.  The patient was  heparinized.  The brachial artery was clamped proximally and distally  and a longitudinal arteriotomy was made.  A segment of 4 mm into the  graft was excised.  The graft was slightly spatulated and sewn end-to-  side to the artery using continuous 6-0 Prolene suture.  Graft was then  reported to be at appropriate length for anastomosis to the  high  brachial vein.  The vein was ligated distally.  It was spatulated  proximally.  It easily took a 5-mm dilator.  It was flushed with  heparinized saline.  The graft was cut to the appropriate length,  spatulated, and sewn end-to-end to the vein using continuous 6-0 Prolene  suture.  Prior to completing the anastomosis, the graft was back-bled  and flushed.  The anastomosis was completed.  There was an excellent  thrill in the graft and a palpable radial pulse.  Hemostasis was  obtained in the wound.  The wounds were closed with a deep layer of 3-0  Vicryl with the skin closed with 4-0 Vicryl.  Sterile dressing was  applied.  The patient tolerated the procedure well and was transferred  to the recovery room in satisfactory condition.  All needle and sponge  counts were correct.      [name]. Edilia Bo, M.D.  Electronically Signed     CSD/MEDQ  D:  07/03/2008  T:  07/04/2008  Job:  [id number]

## 2011-02-07 NOTE — Procedures (Signed)
CEPHALIC VEIN MAPPING   INDICATION:  Cephalic vein  mapping prior to placement of dialysis  access.   HISTORY:   EXAM:   The right cephalic vein is not evaluated.   The left cephalic vein is compressible.   Diameter measurements range from 0.22 to 0.33.   See attached worksheet for all measurements.   IMPRESSION:  Patent left cephalic vein which is of acceptable diameter  for use as a dialysis access site.  The wrist portion of the left  cephalic vein  wraps around to the back of the forearm.   ___________________________________________  [name]. Edilia Bo, M.D.   MC/MEDQ  D:  06/23/2008  T:  06/23/2008  Job:  [id number]

## 2011-02-07 NOTE — Discharge Summary (Signed)
NAMEALESANA, [name]                 ACCOUNT NO.:  [id number]   MEDICAL RECORD NO.:  [id number]          PATIENT TYPE:  INP   LOCATION:  3309                         FACILITY:  [facility]   PHYSICIAN:  Janetta Hora. Fields, MD  DATE OF BIRTH:  [birth year]   DATE OF ADMISSION:  07/15/2007  DATE OF DISCHARGE:  07/16/2007                               DISCHARGE SUMMARY   ADMISSION DIAGNOSIS:  Left carotid stenosis, asymptomatic.   DISCHARGE DIAGNOSIS:  1. Left carotid stenosis, asymptomatic.  2. Hypertension.  3. Gastroesophageal reflux disease.  4. Diabetes.   CONSULTATIONS:  None.   PROCEDURE:  Left carotid endarterectomy with Dacron patch performed by  Dr. Darrick Penna on July 15, 2007.   BRIEF H&P:  The patient is a 65 year old who recently was evaluated by  Dr. Ashley Royalty for an eye procedure.  She had a preoperative carotid  duplex which showed a high-grade carotid stenosis.  Her eye procedure  had been put on hold until this could be evaluated further.  She  previously was noted to have a moderate carotid stenosis and had been  seen by Dr. Madilyn Fireman as recently as 2003.  Since that time, she had not  been seen in our office.  She denies any symptoms of TIA, stroke, or  amaurosis.  Her risk factor include elevated cholesterol, diabetes,  hypertension, coronary artery disease, and smoking, although this was a  long time ago.  She has been seen previously by Dr. Vida Roller  For cardiac evaluation and found to have diffuse 3-vessel coronary  artery disease and felt not to be a candidate for cardiac  revascularization by Dr. Donata Clay.  The patient also has significant  renal insufficiency.  Dr. Darrick Penna believes this patient would benefit  from a carotid endarterectomy for stroke prophylaxis.  He also felt that  she would be moderate risk from a cardiac standpoint due to her severe  un reconstructable coronary disease and previous history of congestive  heart failure.  However, he did believe  that he overall operative  mortality/morbidity risk was still less than 5% which would place her at  least 5% stroke risk over the next year with this level of stenosis.  Dr. Darrick Penna discussed the surgical procedure risks, benefits, and  possible complications, including cardiac and cerebrovascular  complications.  The patient understood and agreed to proceed with the  procedure after signing an informed consent.  On July 15, 2007, the  patient proceeded with the procedure without complications.   HOSPITAL COURSE:  On July 15, 2007, the patient __________  had a  left carotid endarterectomy done by Dr. Darrick Penna.  The patient left the  operating room to the PACU in stable condition.  Later that day  postoperatively, the patient was awake and alert.  Blood pressure was  greater than 150, heart rate 60.  There was no hematoma of the left  neck.  Neuro was unremarkable.  There was no weakness.  Tongue was  stable.  The patient was stable postoperatively.   On July 16, 2007, postoperative day #1, vitals were stable.  The  patient was afebrile.  __________  were arranged.  The patient stated no  difficulty with swallowing.  No bowel movement yet.  She slept well,  tolerated food, strength and weakness decreasing.   PHYSICAL EXAMINATION:  GENERAL:  The patient was sitting in a chair  eating, alert and oriented x3.  HEART:  Normal rate, regular rhythm.  LUNGS:  Clear with normal breath sounds.  ABDOMEN:  Soft, active bowel sounds, nondistended.  EXTREMITIES:  Palpable pulses, increased strength, no weakness.  NEUROLOGIC:  Facial expressions were symmetric with no deviation of  tongue.  Speech was clear.  The incision site was clean and dry with no  redness and no signs of infection.  No hematoma.  Neurologically, the  patient is currently stable.   ASSESSMENT AND PLAN:  Patient was to continue to mobilize __________  and for her to go home today.  The patient has agreed.   DISCHARGE  EXAM:  On July 16, 2007, the patient was discharged to  home.  VITAL SIGNS:  Blood pressure of 120/50, heart rate 73, respirations 16,  temperature 99, O2 97.  Her input was 550, her output was 400.  Her labs  results were all within normal range.  GENERAL:  The patient was sitting in a chair, eating, alert and oriented  x3.  HEART:  Normal rate, regular rhythm.  LUNGS:  Clear with normal bowel sounds.  ABDOMEN:  Soft and active with no distension.  NEUROLOGIC:  The patient was intact with no deviation of the tongue.  Speech was clear.  At the incision site, there was no hematoma, and it  was clean and dry with no redness.   At that time, the patient was currently stable and afebrile and ready to  be discharged to home on July 16, 2007, and the patient agreed with  this.   DISCHARGE MEDICATIONS:  1. Lantus insulin 100 units.  2. NovoLog.  3. Omeprazole 20 mg.  4. __________  statin 80 mg.  5. Actos 15 mg  6. Plavix 75 mg.  7. __________  40 mg.  8. __________  25 mg.  9. Aspirin.  10.__________  300.  11.Percocet 1-2 tablets by mouth every 4-6 hours for pain.   DISCHARGE INSTRUCTIONS:  On July 16, 2007, discharge was discussed  with the patient to go home and continue to follow a diabetic diet.  She  was to increase activity slowly.  She may shower and bathe 2 days past  her surgery.  The patient was also explained not to do any lifting or  driving for 2 weeks.  The patient was to follow up with in the ER or  office if she had any redness, swelling, fevers, severe headache, or any  decrease in strength.  Also, the patient was to contact the office or ER  if she had a fever greater than 101.  The patient was to return to  follow up with Dr. Darrick Penna in 2 weeks.  The patient was also instructed  to take her medications per home medication and reconciliation sheet.  Also additionally, the patient was given Percocet to control pain, and  she was instructed to take 1-2  tablets by mouth every 4-6 hours as  needed.  The patient agreed and stated that she understood these  instructions.   On July 16, 2007, the patient was discharged to home and agreed with  discharge instructions and followup care.  The patient was discharged  with vitals stable and afebrile.  Cyndy Freeze, PA      Janetta Hora. Fields, MD  Electronically Signed    ALW/MEDQ  D:  09/02/2007  T:  09/02/2007  Job:  [id number]

## 2011-02-07 NOTE — Assessment & Plan Note (Signed)
[name]                        CARDIOLOGY OFFICE NOTE   NAME:[name], [name] STARRETT                        MRN:          [id number]  DATE:05/28/2007                            DOB:          1946/06/29    [name] is referred today from [name] from [name] for followup of coronary artery disease.   The patient has not been seen by [place] in a couple of years.  She has known  coronary artery disease.  She was cathed in December, 2006 by Dr.  Juanda [name].  Catheterization showed severe three-vessel coronary artery  disease, including diffuse left main disease.  She had very poor distal  targets.  She was referred to [facility], who turned her down for surgery.  [name] felt that medical therapy was then warranted.   The patient has a questionable history of diastolic heart failure.  She  has good LV function by cath and also by nuclear scan in 2003.   In talking to the patient, she gets very infrequent chest pain.  The  pain is nonexertional.  It is in the [name] of her chest.  It can be  radiating to both shoulders.  It is not progressive.  She has had maybe  one episode in the last three months.   She has been compliant with her medications.   In short, she really has not had active angina.  Her symptomatology  primarily involves her lower legs.  She has been having weakness with  her left leg giving out for about two years.  Apparently, she is to see  [name] up at [facility] soon.  It is not clear  to me whether she has a hip or a [name] problem.  She has significant pain  behind the left [name].  There is a question of neuropathy from diabetes,  and she also has seen [name] in the past, but I do not think  she has limiting claudication.   The weakness in the left leg has been progressive over two years.  There  is pain both at the hip and the [name] level.  She has not had any  nonhealing  ulcers.  She has chronic lower extremity edema, which is  stable.  She takes 40 of Lasix twice a day.   As far as I know, she has not had recent ABIs.  There is also a  questionable history of previous carotid disease with 80% disease on the  left side.  Again, we will not have to get records from [name].   Patient's review of systems is otherwise negative.  In particular, she  has not had any PND or orthopnea.  Her activity level is limited by her  left lower extremity pain.   PAST MEDICAL HISTORY:  Includes diastolic heart failure, three vessel  disease, medical therapy only, diabetes, hypertension,  hypercholesterolemia, chronic anemia, GERD.   She denies any allergies.   Her medications include Plavix 75 a day, Lasix 40 b.i.d., Cymbalta 30 a  day, an aspirin a day, sliding-scale Lantus,  Actos 15 a day, Novolin-R  10 units daily, Prilosec 20 a day, Pravastatin 80 a day, potassium 20 a  day.   FAMILY HISTORY:  Noncontributory.   The patient lives in the [place] area.  She would like to follow up in  [place], and I told her we could arrange this, for her to see Dr.  Antoine [name]  She has a fiance named [name] and may be married this decreased.  She is not very active due to left lower extremity pain and uses a  walker.  She does not drink or smoke.   PHYSICAL EXAMINATION:  Her exam is remarkable for a chronic ill-  appearing, fairly disheveled white female in no distress.  Affect is appropriate.  Weight is 175.  Blood pressure is 130/70 in each  arm.  Pulse is 64 and regular.  Respiratory rate is 14.  She is  afebrile.  HEENT:  Normal.  I do not hear any carotid bruits.  JVP is normal.  There is no  thyromegaly, no lymphadenopathy, no JVP elevation.  LUNGS:  Clear with good diaphragmatic motion.  No wheezing.  There is an S1 and S2 with normal heart sounds.  PMI is normal.  ABDOMEN:  Benign.  Bowel sounds are positive with no tenderness.  No  renal bruits.  No AAA.  No  hepatosplenomegaly or hepatojugular reflux.  Femorals are +2 bilaterally without bruit.  PT's are +1 bilaterally.  I  cannot feel a popliteal cyst.  There is +1-2 lower extremity edema  bilaterally.  She does have peripheral neuropathy from mid shin down  bilaterally.  NEURO:  Nonfocal.  There is muscular weakness with abduction and  adduction, both at the hip and left [name].   Her electrocardiogram is essentially normal.   IMPRESSION:  1. Infrequent chest pain with known three-vessel disease, continue      medical therapy.  Patient's baseline heart rate is only in the 64      range.  If she were to need surgery in the future, she clearly      would have to have Coreg started.  She is not a candidate for open      heart surgery, and I doubt there is any amenable PTCA or stent      treatment available, as [name] would have proceeded with this.      So long as she does not need anesthesia or surgery for her left      leg, we will continue her current medications, including aspirin or      Plavix.  2. History of carotid disease, ill-defined.  No records in her chart.      Follow up with [name].  She may be a vascular path since      she has known coronary artery disease.  She has not had any      transient ischemic attacks.  She will continue her aspirin and      Plavix.  3. Lower extremity edema:  Chronic, likely due to venous      insufficiency.  Continue current dose of Lasix on a b.i.d. basis.  4. Left lower extremity weakness:  She has a follow-up appointment      with [name] and hopefully will see [name] as well.  It is not      clear to me whether this is a primary hip problem or vascular.  Her      pulses seem adequate.  She at  the least would need ABIs and to      follow up with [name].  [name] will see the patient and further      evaluate her [name] and hip.  5. Diabetes:  Follow up with [name] and      [name].  Hemoglobin A1C  quarterly.  From the sounds of it, her      blood sugars have not been well controlled, and she is to start at      a sliding scale.  She should aim for a hemoglobin A1C in the 6      range, given her known three vessel disease.  6. Hypercholesterolemia:  Last LDL cholesterol under 100.  Continue      Pravastatin 80 a day.  7. Hypertension:  Currently, blood pressure seems reasonably      controlled; however, she is a diabetic and is not on an ACE      inhibitor.  I am not sure the reason for this.  She previously had      been on Micardis, which was stopped.  I think it would be      reasonable to put the patient back on medications such as Mavik 2-4      mg a day for renal protection.  I will leave this up to [name]      [name].   We will arrange followup with her with [name] in [place] in three  months.     Noralyn Pick. Eden Emms, MD, [facility]  Electronically Signed    PCN/MedQ  DD: 05/28/2007  DT: 05/28/2007  Job #: [id number]

## 2011-02-07 NOTE — Assessment & Plan Note (Signed)
OFFICE VISIT   [name], [name]  DOB:  [birth year]                                       07/31/2007  MWUXL#:[id number]   The patient underwent left carotid endarterectomy on October 20.  She  had an uneventful postoperative recovery.  She had no neurologic  sequela.   EXAM:  Blood pressure is 116/71 in the right arm, 14/65 in the left arm,  pulse 58 and regular.  Left neck incision is healing well.  There is a  small hematoma approximately 2 cm in diameter in the central aspect that  should resolve over time.  She does have some mild numbness under the  left mandibular area.  This probably represents some mild compression of  the marginal mandibular nerve during the operation, but this should  recover as well over time.  She has had no neurologic symptoms of upper  extremity or lower extremity weakness or numbness.  Her tongue is  midline.  She does not manifest any TIA or amaurosis symptoms.  I  assured her today that she should be okay for any type of ophthalmologic  procedure that she has scheduled.  She will follow up with [place] in 6  months for repeat carotid duplex exam and be placed on carotid  surveillance protocol.  She will also continue to take her aspirin.   Janetta Hora. Fields, MD  Electronically Signed   CEF/MEDQ  D:  07/31/2007  T:  08/01/2007  Job:  [id number]   cc:   Ernestina Penna, M.D.  Beulah Gandy. Ashley Royalty, M.D.

## 2011-02-07 NOTE — Progress Notes (Signed)
[facility]                        PERIPHERAL VASCULAR OFFICE NOTE   NAME:[name], [name]                        MRN:          [id number]  DATE:06/02/2008                            DOB:          June 13, 1946    REASON FOR CONSULTATION:  Lower extremity peripheral arterial disease  with intermittent claudication.   HISTORY OF PRESENT ILLNESS:  [name] is a 65 year old woman with  longstanding diabetes presenting for evaluation of lower extremity PAD.  She has a 3-year history of bilateral leg weakness and gait  unsteadiness.  She ambulates with a walker.  She has chronic lower  extremity edema.  Recently, she has complained of left calf [name] with  walking.  Her symptoms resolved with rest.  She underwent a lower  extremity duplex with ABIs that showed an ABI of 0.92 on the right and  0.71 on the left.  She had biphasic common femoral waveforms with  increased velocities in the left popliteal region.  She was referred for  further evaluation of this problem.   The patient's symptoms have been stable over the past few years.  She  denies right leg [name] with ambulation.  Her leg weakness has been  present ever since she underwent right foot surgery just over 3 years  ago.  She did not participate in rehab following surgery and has been  dependent on a walker ever since.  She recently underwent physical  therapy and this has helped with performing her activities of daily  living.  Prior to that she was unable to do basic things such as putting  on shoes or taking a shower.  She is now able to do those activities  independently.  She denies any lower extremity ulcerations.  She has no  [name] at rest.   MEDICATIONS:  1. Metoprolol 25 mg twice daily.  2. Plavix 75 mg daily.  3. Torsemide 100 mg daily.  4. Omeprazole 20 mg daily.  5. Hydralazine 50 mg daily.  6. Gabapentin 300 mg t.i.d.  7. Aspirin 81 mg daily.  8. Potassium daily.  9. Calcium 600 mg  twice daily.  10.Lantus insulin as directed.  11.NovoLog sliding scale.  12.Lipitor 40 mg daily.   PAST MEDICAL HISTORY:  1. Coronary artery disease.  Cardiac catheterization in 2006      demonstrated diffuse severe three-vessel CAD with diabetic      vasculopathy and poor targets.  She was felt to not be a good      surgical candidate because of her distal coronary artery disease.  2. Carotid stenosis status post left carotid endarterectomy.  3. Chronic kidney disease with creatinine in May 2009 of 3.05.  4. Hyperlipidemia.  5. Longstanding type 2 diabetes, insulin dependent.  6. Partial hysterectomy.  7. Right foot surgery as above.  8. Peripheral neuropathy.  9. Hypertension.   SOCIAL HISTORY:  The patient is married.  She lives in [place] and has 3  children.  She worked as a Lawyer.  She has no history of tobacco or  alcohol use.   FAMILY HISTORY:  There is no history  of peripheral arterial disease or  coronary artery disease in the family.   REVIEW OF SYSTEMS:  Complete 12-point review of systems was performed.  Pertinent positives included chronic lower extremity edema and chronic  dyspnea on exertion.  Other symptoms negative except as outlined in the  HPI.   PHYSICAL EXAMINATION:  GENERAL:  The patient is alert and oriented.  She  is in no acute distress.  VITAL SIGNS:  Weight is 196 pounds, blood pressure 116/60 in the left  arm and 118/60 in the right arm, heart rate 64, and respiratory rate 16.  The patient unable to get onto the exam table so she was examined in a  wheelchair.  HEENT:  Normal.  NECK:  Normal carotid upstrokes with soft bilateral carotid bruits.  JVP  normal.  No thyromegaly or thyroid nodules.  LUNGS:  Clear bilaterally.  HEART:  Regular rate and rhythm with 2/6 systolic ejection murmur along  the left sternal border.  No diastolic murmurs or gallops.  ABDOMEN:  Soft and nontender.  No bruits.  BACK:  No CVA tenderness.  EXTREMITIES:  2+ edema  on the right pretibial region, 3+ edema on the  left.  Popliteal pulses are 1+ bilaterally.  DP and PT pulses are trace  on the right and not palpable on the left.  SKIN:  No area of skin breakdown or ulceration are present.  LYMPHATICS:  No adenopathy.  NEUROLOGIC:  Cranial nerves II through XII are intact.  Strength intact  and equal bilaterally.   ASSESSMENT:  [name] is a 65 year old woman with lower extremity  peripheral arterial disease as outlined.  She has intermittent  claudication affecting the left calf.  Her symptoms are stable and are  minimally lifestyle limiting.  She is most limited by her leg weakness  which is clearly multifactorial.  I think with her limited functional  capacity that revascularization is not indicated.  Recommend continued  aggressive medical therapy to reduce overall cardiovascular risk.  The  patient is on an excellent program at present.  If she ever does require  angiography, her renal function will be a major issue as she has  advanced kidney disease and the benefits of revascularization will have  to be carefully weighed against the risk of progressive renal decline.   I would like to see [name] back in 1 year with follow up ABI and  duplex ultrasound of the lower extremities at that time.  If she  develops progressive symptoms of lower extremity arterial insufficiency,  she will notify us immediately.     Veverly Fells. Excell Seltzer, MD  Electronically Signed    MDC/MedQ  DD: 06/02/2008  DT: 06/03/2008  Job #: [id number]   cc:   Rollene Rotunda, MD, [facility]  Cecille Aver, M.D.

## 2011-02-07 NOTE — Procedures (Signed)
CAROTID DUPLEX EXAM   INDICATION:  Known left internal carotid artery disease.  The patient  had carotid Dopplers at [facility] on 07/01/2007 which showed  an 80-99% left internal carotid artery stenosis.   HISTORY:  Diabetes:  Yes, on insulin.  Cardiac:  Congestive heart failure, myocardial infarction.  Hypertension:  Yes.  Smoking:  No.  Previous Surgery:  No.  CV History:  Amaurosis Fugax No, Paresthesias No, Hemiparesis Yes, left side for  approximately 1 year.                                       RIGHT             LEFT  Brachial systolic pressure:         180.              180.  Brachial Doppler waveforms:         Biphasic.         Biphasic.  Vertebral direction of flow:        Antegrade.        Antegrade.  DUPLEX VELOCITIES (cm/sec)  CCA peak systolic                   58.               69.  ECA peak systolic                   98.               144.  ICA peak systolic                   104.              292.  ICA end diastolic                   30.               68.  PLAQUE MORPHOLOGY:                  Calcified.        Calcified with  shadowing.  PLAQUE AMOUNT:                      Mild to moderate. Large.                      PLAQUE LOCATION:  ICA.  ICA, ECA.   Left ICA was noted to be tortuous.   IMPRESSION:  1. 20-39% right internal carotid artery stenosis.  2. Left external carotid artery stenosis.  3. 60-79% left internal carotid artery stenosis, however, calcified      plaque with shadowing could obscure a more severe stenosis.   ___________________________________________  Janetta Hora Darrick Penna, MD   DP/MEDQ  D:  07/03/2007  T:  07/04/2007  Job:  [id number]

## 2011-02-10 NOTE — Consult Note (Signed)
[name], [name]                 ACCOUNT NO.:  [id number]   MEDICAL RECORD NO.:  [id number]          PATIENT TYPE:  INP   LOCATION:  A330                          FACILITY:  [facility]   PHYSICIAN:  Lionel December, M.D.    DATE OF BIRTH:  04/03/1946   DATE OF CONSULTATION:  03/08/2006  DATE OF DISCHARGE:                                   CONSULTATION   REASON FOR CONSULTATION:  Acute-on-chronic anemia.   HISTORY OF PRESENT ILLNESS:  [name] is a 65 year old Caucasian female who  was admitted with acute-on-chronic anemia.  We saw her in January of 2007  for diarrhea, anemia, and weight loss.  At that time, she was suspected to  have C dif colitis; however, this was not proven.  She had had a recent MI,  and therefore we opted to postpone colonoscopy, as she had gotten better  with empirical Flagyl.  She also has a history of chronic anemia which was  previously felt to be mixed with iron deficiency anemia and anemia of  chronic disease.  She has not had her colonoscopy since her discharge  because of ongoing recurrent right foot abscess requiring recurrent  debridement.  She has been at home on IV vancomycin and IV doxycycline for  her right foot abscess.  She is currently into her seventh or eighth week of  IV antibiotic therapy.  She has noted progressive fatigue and dyspnea on  exertion.  Four to five days ago, she developed acute onset nausea and  vomiting for about 24-hours' duration.  She has had intermittent vomiting  since then.  The last time was about 24 hours ago.  She has had intermittent  heartburn, currently on Prevacid 30 mg daily as needed.  She denies any  dysphagia, odynophagia, hematemesis, melena, rectal bleeding, or diarrhea.  She generally has a bowel movement every three days.  She had routine blood  work which revealed a hemoglobin of 7.5.  She was asked to come into the  emergency department for further management.  Upon presentation, her  hemoglobin was 7.5,  hematocrit 21.7, MCV 86.4.  On January 17, 2006, her  hemoglobin was 9.9, hematocrit 29.4.  Her white count was 9700 but today is  up to 15,500.  She has received 2 units of packed red blood cells yesterday,  and her hemoglobin today is 12, hematocrit 35.1.  This made number four and  five packed red blood cells since December.  Anemia profile reveals an iron  of 30, ferritin 582, TIBC 122, iron saturation 25%.  She reports ongoing  weight loss, although she has not been weighed this hospitalization.  On  January 08, 2006, her weight was 145, and on August 30, 2005 her weight was  156.   MEDICATIONS GIVEN:  1.  Prevacid 30 mg daily p.r.n.  2.  Doxycycline 100 mg IV q.12h.  3.  Vancomycin 1 g IV every other day.  4.  Hydrochlorothiazide 12.5 mg daily.  5.  Lotensin 20 mg daily.  6.  Plavix 75 mg daily.  7.  Lipitor 80 mg daily.  8.  Actos 30 mg daily.  9.  Cymbalta 30 mg daily.  It is not clear whether or not she is on insulin.   ALLERGIES:  CODEINE CAUSES NAUSEA AND VOMITING.   PAST MEDICAL HISTORY:  1.  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      Last wide excision and drainage was on January 12, 2006.  She had a Port-A-      Cath placed at that time and has been undergoing IV antibiotic therapy      at home.  2.  Hyperlipidemia.  3.  History of CVA.  4.  History of insulin-requiring diabetes mellitus.  5.  Severe three-vessel coronary artery disease, nonsurgical candidate,      managed medically with recent non-Q-wave MI in December of 2006.  6.  Anemia, unclear etiology at this point.  7.  Status post hysterectomy.  8.  No prior endoscopy.   FAMILY HISTORY:  Negative for colorectal cancer.  Two sisters have had colon  polyps.  Mother died of lung cancer.  A son was treated for lymphoma, felt  to be in remission.   SOCIAL HISTORY:  She lives with her fiance, Ree Kida Grooms.  Good family  support, and children live nearby.  No tobacco, alcohol, or  drug use.   REVIEW OF SYSTEMS:  See HPI for GI.  CARDIOPULMONARY:  Complains of dyspnea  on exertion but no chest pain.   PHYSICAL EXAMINATION:  VITAL SIGNS:  Temperature 97.5, pulse 74,  respirations 20, blood pressure 124/55.  GENERAL:  Pleasant, well-nourished, well-developed Caucasian female in no  acute distress.  SKIN:  Warm and dry.  No jaundice.  HEENT:  Conjunctivae are pink.  Sclerae nonicteric.  Oropharyngeal mucosa  moist and pink.  Or lesions, erythema, or exudate.  She has upper and lower  dentures.  NECK:  No lymphadenopathy or thyromegaly.  CHEST:  Lungs clear to auscultation.  CARDIAC:  Faint systolic murmur heard best at the apex.  ABDOMEN:  Positive bowel sounds.  Soft, nontender, nondistended.  No  organomegaly or masses.  No rebound tenderness or guarding.  No abdominal  bruits or hernias.  EXTREMITIES:  No edema.  Right foot has a dressing, dry and intact with no  drainage.  RECTAL:  Rectal exam was done in the ED and is reported to be normal.   LABORATORY DATA:  As mentioned in the HPI.  In addition, her platelet count  was 409,000.  INR 1.2.  Sodium 134, potassium 3.4, BUN 28, creatinine 2,  glucose 259 and on admission was 421.  TSH 2.108.   IMPRESSION:  The patient is a 65 year old lady with acute-on-chronic anemia  of unclear etiology.  She has had no prior endoscopies.  In January, her  Hemoccults were negative twice.  She has not seen any overt gastrointestinal  bleeding.  She has had nausea and vomiting the last five days of unclear  etiology, possibly due to antibiotic therapy, gastroparesis.  Cannot exclude  gastroesophageal reflux disease or peptic ulcer disease.  Doubt gastric  outlet obstruction or small-bowel obstruction.  Given no prior endoscopy,  she does need to have a colonoscopy.  She may need to have an upper  endoscopy as well because of ongoing nausea and vomiting and especially if colonoscopy is negative to further evaluate her  anemia.   PLAN:  1.  Colonoscopy and possible EGD in the morning.  2.  CBC, MET7, LFTs in the a.m.  3.  Continue IV Protonix.  4.  Will check her weight today.  5.  Further recommendations to follow.      Tana Coast, P.A.      Lionel December, M.D.  Electronically Signed    LL/MEDQ  D:  03/08/2006  T:  03/08/2006  Job:  [id number]   cc:   Lionel December, M.D.  [id number]  [place]  [place] [address]   [facility]

## 2011-02-10 NOTE — Group Therapy Note (Signed)
NAMEESRA, FRANKOWSKI                 ACCOUNT NO.:  [id number]   MEDICAL RECORD NO.:  [id number]           PATIENT TYPE:  INP   LOCATION:  A306                          FACILITY:  [facility]   PHYSICIAN:  Margaretmary Dys, M.D.DATE OF BIRTH:  [birth year]   DATE OF PROCEDURE:  10/08/2005  DATE OF DISCHARGE:                                   PROGRESS NOTE   SUBJECTIVE:  Patient continues to feel well.  She only had 2 loose bowel  movements today, which is remarkably changed from what she had in the past.  She denies any fever or chills, she has no abdominal pain, no nausea or  vomiting, she denies any chest pain, she continues to do fairly well.  She  said her energy level has also improved.   OBJECTIVE:  Conscious, alert, comfortable, not in acute distress.   VITAL SIGNS:  Her blood pressure was 126/67, pulse of 108, respirations of  18, Tmax was 97.4.  HEENT EXAM:  Normocephalic, atraumatic, oral mucosa was moist, no exudates.  NECK:  Supple, no JVD, no lymphadenopathy.  LUNGS:  Were clear clinically.  HEART:  S1, S2 regular, no S3, S4, gallops or rubs.  ABDOMEN:  Soft, nontender, bowel sounds positive.  No masses palpable.  EXTREMITIES:  No edema.  Right foot cellulitis is improving.   LABORATORY DATA:  The WBC was 9.1, hemoglobin 8.3, hematocrit 23.9, platelet  count was 303.  Sodium 134, potassium 3.7, chloride of 106, CO2 26, BUN of  5, creatinine 0.9, glucose 124, calcium of 7.3.   ASSESSMENT AND PLAN:  1.  Diarrhea of unclear etiology, but patient is remarkably improved.  She      is currently on Flagyl 250 mg p.o. t.i.d., will continue this for now.      Our plan is to do a colonoscopy as an outpatient.  2.  Dehydration.  Patient feels much better and actually, I think, is fully      hydrated; her oral intake has also improved.  Will discontinue IV      fluids.  3.  Anemia, a combination of iron deficiency anemia and anemia of chronic      disease.  Patient's H&H is down today to  8.3 and 23.9.  I have discussed      with her in detail if she wanted to be transfused, patient was very      concerned.  I told her that in light of her coronary artery disease it      might be better to have her H&H up a little bit.  We both agreed to      recheck CBC in the morning and if it is still below the desirable range      she will get a blood transfusion.  4.  Coronary artery disease, patient is stable, has no chest pain.  5.  Right foot cellulitis.  Will continue with current dressing, patient has      Bactroban with Hydrogel dressings.  She may be switched to Augmentin at      discharge.  Margaretmary Dys, M.D.  Electronically Signed     AM/MEDQ  D:  10/08/2005  T:  10/08/2005  Job:  [id number]

## 2011-02-10 NOTE — Op Note (Signed)
[name], [name]                 ACCOUNT NO.:  [id number]   MEDICAL RECORD NO.:  [id number]          PATIENT TYPE:  INP   LOCATION:  A330                          FACILITY:  [facility]   PHYSICIAN:  Lionel December, M.D.    DATE OF BIRTH:  October 19, 1945   DATE OF PROCEDURE:  03/09/2006  DATE OF DISCHARGE:                                 OPERATIVE REPORT   PROCEDURE:  Esophagogastroduodenoscopy followed by colonoscopy.   INDICATIONS:  Ms. Berte is a 65 year old Caucasian female with diabetes  mellitus, coronary artery disease, who is also being treated for  osteomyelitis of her foot, presents with profound anemia.  She was seen by  [place] earlier in the year for diarrhea felt to be C. diff.  At that time her  anemia was evaluated with two heme-negative stools and her anemia profile  was not consistent with iron deficiency.  We felt that she should get a  colonoscopy at least for screening purposes.  Now she presents with a  hemoglobin of 7.5 grams and has received 2 units of PRBCs.  A hemoglobin  today was 11.1.  She has also been experiencing nausea and vomiting for the  last few days and it is suspected she may have gastroparesis.  She is  undergoing diagnostic EGD and colonoscopy, both for diagnostic and screening  purposes.  The procedures risks were reviewed with the patient and informed  consent was obtained.   MEDS FOR CONSCIOUS SEDATION:  Benzocaine spray for pharyngeal topical  anesthesia, Demerol 50 mg IV, Versed 5 mg IV.   FINDINGS:  The procedure was performed in the endoscopy suite.  The  patient's vital signs and O2 sat were monitored during procedure and  remained stable.   PROCEDURE:  1.  Esophagogastroduodenoscopy.  The patient was placed in the left lateral      position and the Olympus videoscope was passed via oropharynx without      any difficulty into the esophagus.   Esophagus:  The mucosa of the esophagus was normal.  GE junction mucosa was  normal except there was a  single erosion contiguous with the GE junction  which was at 39 cm from the incisors and there was focal erythema at GEJ.  No hernia was noted.   The Stomach was empty and distended very well with insufflation.  The folds  of the proximal stomach were normal.  Examination of the mucosa revealed  multiple antral erosions, some of these were over 5 mm long.  There was some  erythema around these erosions but there was no active bleeding noted.  The  pyloric channel was patent.  The angularis, fundus and cardia were examined  by retroflexing the scope and were normal.   Duodenum:  The bulbar mucosa was normal.  The scope was passed to the second  part of duodenum where mucosa and folds were normal.  Pictures were taken  for the record.   The endoscope was withdrawn and the patient prepared for procedure #2.   1.  Colonoscopy.  Rectal examination was performed, no abnormality noted on  external or digital exam.  The Olympus videoscope was placed in the      rectum and advanced under vision into the sigmoid colon and beyond.  The      preparation was fair to satisfactory.  Some areas had thick stool.  The      scope was advanced to the cecum which was identified by the ileocecal      valve and appendiceal orifice.  This area had to be washed vigorously to      examine the underlying mucosa.  Pictures were taken for the record.  As      the scope was withdrawn, the colonic mucosa was carefully examined and      no mucosal abnormalities or diverticular changes were noted.  The rectal      mucosa was normal.  The scope was retroflexed to examine the anorectal      junction which was unremarkable.  The endoscope was straightened and      withdrawn.  The patient tolerated the procedures well.   FINAL DIAGNOSIS:  1.  Erosive reflux esophagitis.  2.  Multiple erosions at the gastric antrum, H. pylori infection needs to be      ruled out.  3.  Normal examination of bulb and postbulbar  duodenum.  4.  Normal colonoscopy.   RECOMMENDATIONS:  1.  Will continue her PPI but switch her to p.o.  2.  Reglan will be switched to p.o. in the a.m.  3.  H. pylori serology.  4.  Since there is no evidence of active bleeding, Plavix could be resumed.  5.  She will also have an H&H in the a.m.      Lionel December, M.D.  Electronically Signed     NR/MEDQ  D:  03/09/2006  T:  03/09/2006  Job:  [id number]   cc:   Hanley Hays. Josefine Class, M.D.  Fax: [phone]

## 2011-02-10 NOTE — Procedures (Signed)
NAMEJAMMY, STLOUIS                 ACCOUNT NO.:  [id number]   MEDICAL RECORD NO.:  [id number]          PATIENT TYPE:  INP   LOCATION:  A214                          FACILITY:  [facility]   PHYSICIAN:  Advance Bing, M.D. [facility] OF BIRTH:  23-Aug-1946   DATE OF PROCEDURE:  09/07/2005  DATE OF DISCHARGE:                                  ECHOCARDIOGRAM   REFERRING PHYSICIAN:  Margaretmary Dys, M.D.   CLINICAL DATA:  A 65 year old woman with anemia, cellulitis, diabetes and  hypertension.   M-MODE TRACINGS:  Aorta 3.1, left atrium 4.1, septum 1.4, posterior wall  1.4, LV diastole 3.6, LV systole 2.7.   FINDINGS:  1.  Technically suboptimal, but adequate echocardiographic study.  2.  Left atrial size at the upper limit of normal; normal right atrium and      right ventricle.  3.  Mild sclerosis of a trileaflet aortic valve; mild annular calcification.  4.  Slight mitral valve thickening; mild annular calcification; trivial      regurgitation.  5.  Normal tricuspid valve; mild regurgitation; mild elevation in estimated      RV systolic pressure.  6.  Normal pulmonic valve and proximal pulmonary artery.  7.  Normal left ventricular size; mild hypertrophy with a sigmoid septal      configuration and disproportionate upper septal thickening. Very mild      hypokinesis of the distal anteroseptal segment and apex.  Overall      function is preserved with an estimated ejection fraction of 0.55.      [place] Bing, M.D. [facility]  Electronically Signed     RR/MEDQ  D:  09/07/2005  T:  09/08/2005  Job:  [id number]

## 2011-02-10 NOTE — Group Therapy Note (Signed)
NAMEILISA, [name]                 ACCOUNT NO.:  [id number]   MEDICAL RECORD NO.:  [id number]          PATIENT TYPE:  INP   LOCATION:  A330                          FACILITY:  [facility]   PHYSICIAN:  Margaretmary Dys, M.D.DATE OF BIRTH:  [birth year]   DATE OF PROCEDURE:  DATE OF DISCHARGE:                                   PROGRESS NOTE   OBJECTIVE:  The patient feels well.  She received two units of blood  yesterday without any concerns.  She is otherwise comfortable and feels a  little stronger today. She denies any hematemesis.  She has had some dark  stools.   Comfortable, not in acute distress, very pleasant lady.  She remembers me  from previous encounters.  Vital signs:  Blood pressure 126/72, pulse 89,  respirations 20, temperature 97.9.  HEENT exam:  Normocephalic, atraumatic.  Oral mucosa was moist with no exudates.  Neck supple, no JVD, no  lymphadenopathy.  Lungs clear with good air entry bilaterally.  Heart:  S1  and S2 regular, no S3, S4, gallops or  rubs.  Abdomen soft and nontender,  bowel sounds positive.  No masses palpable.  Extremities:  No edema.   LABORATORY DATA:  White blood cell count 15.5, hemoglobin 12, hematocrit  35.1, platelet count was 409, neutrophils 81%.  Sodium 134, potassium 3.4,  chloride of 97, cO2 of 29, glucose 259, BUN 28, creatinine 2.0, calcium 8.6.   ASSESSMENT AND PLAN:  1.  Acute on chronic anemia, etiology remains unclear.  The patient is      scheduled for an EGD tomorrow morning as she could not have endoscopy in      the past because of her history of myocardial infarction.  Plavix may      also be an exacerbating factor here and that remains on hold for now.      We will continue on IV Protonix and keep her n.p.o.  Follow      recommendations of GI.  2.  Will resume other cardiac medications obviously except Plavix.  She      remains comfortable, she denies any chest pain or shortness of breath.      She received two units of blood  without any incident.      Margaretmary Dys, M.D.  Electronically Signed     AM/MEDQ  D:  03/08/2006  T:  03/08/2006  Job:  [id number]

## 2011-02-10 NOTE — Discharge Summary (Signed)
[name], [name]                 ACCOUNT NO.:  [id number]   MEDICAL RECORD NO.:  [id number]          PATIENT TYPE:  INP   LOCATION:  A306                          FACILITY:  [facility]   PHYSICIAN:  Calvert Cantor, M.D.     DATE OF BIRTH:  1946/03/22   DATE OF ADMISSION:  08/30/2005  DATE OF DISCHARGE:  LH                                 DISCHARGE SUMMARY   DISCHARGE DIAGNOSES:  1.  Cellulitis and abscess of right foot, status post irrigation and      drainage.  2.  Poorly controlled type 1 diabetes mellitus.  3.  Iron deficiency anemia.  4.  Hypertension.  5.  Pulmonary edema, possibly secondary to heart disease.  6.  Hypokalemia.   HOSPITAL COURSE:  This is a 65 year old white female who was admitted with  pain and swelling of her right foot.  She was found to have an abscess.  A  consult was placed with Dr. Katrinka Blazing who did an incision and drainage.  The  discharge was sent for a culture which has only grown group B strep or strep  agalactiae.  Blood cultures have been negative.  The patient was initially  on Unasyn, vancomycin and Rifampin.  Since her cultures were negative, the  vancomycin and the Rifampin were discontinued.  Unasyn was continued.  She  was receiving pulsatile lavage of her wound on a daily basis which has been  healing well.  The patient was found to be short of breath on September 05, 2006.  Her oxygen saturation was 86%.  A CT of the chest was done to rule  out a PE.  It was negative for a PE, however, she was found to be in  congestive heart failure with bilateral pleural effusions and diffusely body  wall edema.  Her troponins were slightly elevated ranging from 0.24 to 0.28.  Her BNP went up to 1820.  The patient had a 2-D echocardiogram done which  showed an overall EF of 55%.  There was mild hypokinesis of the distal  anteroseptal segment and the apex.  The patient also had a cardiology  consult with Dr. Dorethea Clan who suggested that she have a catheterization  done.  Therefore, the patient is being sent to [facility] today for a cardiac  catheterization which is going to be done on Monday.  She is to continue on  Lasix.  Currently, she has a negative _________.      Calvert Cantor, M.D.  Electronically Signed     SR/MEDQ  D:  10/25/2005  T:  10/25/2005  Job:  [id number]

## 2011-02-10 NOTE — Consult Note (Signed)
[name], WADDINGTON NO.:  [id number]   MEDICAL RECORD NO.:  [id number]          PATIENT TYPE:  INP   LOCATION:  A218                          FACILITY:  [facility]   PHYSICIAN:  Barbaraann Barthel, M.D. DATE OF BIRTH:  12/12/1945   DATE OF CONSULTATION:  04/06/2006  DATE OF DISCHARGE:                                   CONSULTATION   NOTE:  Surgery was asked to see this 65 year old white female who has had a  Port-A-Cath placed through the left subclavian vein.  The hospitalists  called me when she was noted to have positive fungus cultures, yeast  cultures, and requested removal of this Port-A-Cath.  This was placed some  time ago for antibiotic control of her osteomyelitis of her right foot.  She  also has problems with diabetes and chronic renal failure.  She was anemic  with an H&H of 8 and 24.  She has just received 2 units of packed red blood  cells.  We will plan for removal of her Port-A-Cath under local anesthesia  here today.  I have discussed this with the hospitalists and they are in  agreement, and I discussed this with the patient, discussing complications  not limited to but including bleeding, infection and the possibility that  more surgery may be required.  I also discussed that need for any future  long-lasting line, a PICC line should be considered.      Barbaraann Barthel, M.D.  Electronically Signed     WB/MEDQ  D:  04/06/2006  T:  04/06/2006  Job:  [id number]   cc:   Hospitalist Service

## 2011-02-10 NOTE — Discharge Summary (Signed)
NAMEILLYANA, SCHORSCH                 ACCOUNT NO.:  [id number]   MEDICAL RECORD NO.:  [id number]          PATIENT TYPE:  INP   LOCATION:  A330                          FACILITY:  [facility]   PHYSICIAN:  Margaretmary Dys, M.D.DATE OF BIRTH:  March 12, 1946   DATE OF ADMISSION:  03/07/2006  DATE OF DISCHARGE:  06/17/2007LH                                 DISCHARGE SUMMARY   DISCHARGE DIAGNOSES:  1.  Severe anemia.  2.  Diabetes mellitus type 2 poorly controlled.  3.  Chronic renal insufficiency.  4.  Chronic osteomyelitis of the foot.  5.  History of coronary artery disease, severe, status post stent placement.  6.  Probable restless leg syndrome.  7.  Medical weight loss.   DISCHARGE MEDICATIONS:  1.  Doxycycline 100 mg IV q.12.  2.  Vancomycin 1000 mg IV every other day.  3.  Hydrochlorothiazide 12.5 mg p.o. once a day.  4.  Lotensin 20 mg once a day.  5.  Plavix 75 mg once a day.  6.  Labetalol 80 mg once a day.  7.  Actos 30 mg once a day.  8.  Cymbalta 30 mg once a day.  9.  Protonix 40 mg once a day.   CONSULTATIONS OBTAINED:  Dr. Loreta Ave, GI.  Indication for acute-on-chronic  anemia.   PROCEDURES PERFORMED:  Colonoscopy and EGD on March 09, 2006.  Results of  colonoscopy revealed multiple superficial ulcers with no significant  bleeding ulcer noted.  It was essentially an unremarkable endoscopy and  colonoscopy.   DIET:  Diabetic diet.   ACTIVITY:  As tolerated.   SPECIAL INSTRUCTIONS:  The patient advised to return to the ED or call her  primary care physician if she has blood in her stools or she has coffee-  grounds emesis.   HOSPITAL COURSE:  Ms. Harneet Noblett is a 65 year old Caucasian female well  known to the hospitalist service from previous admissions.  She does have a  history of coronary artery disease, diabetes, and osteomyelitis of her right  foot.  She also has chronic anemia and renal insufficiency.  The patient was  admitted because of severe anemia at the time  of her admission, her  hematocrit was quite low, she was about 7.5.  The patient was transfused  with PRBC.  She was hemodynamically stable throughout her admission.  Her  Plavix was held because of concern that this was worsening anemia.  She  however underwent and endoscopy which did not reveal any significant  abnormality.   Subsequent to her transfusion her H&H stayed fairly stable.  Her  diabetes/blood sugar was difficult to control at some point but has improved  over time.   The patient was also continued on IV antibiotics.   The patient has severe coronary artery disease as a result of her negative  EGD and negative colonoscopy.  We have recommended that patient resume her  Plavix but I think if this anemia becomes refractory that may be something  we may decide to discontinue.  She does have severe coronary artery disease  and had a stent  placed recently perhaps we can discontinue it in a few  months.   DISCHARGE DISPOSITION:  To home.   CONDITION ON DISCHARGE:  Satisfactory.      Margaretmary Dys, M.D.  Electronically Signed     AM/MEDQ  D:  03/24/2006  T:  03/24/2006  Job:  [id number]

## 2011-02-10 NOTE — Consult Note (Signed)
NAMERUWEYDA, MACKNIGHT                 ACCOUNT NO.:  [id number]   MEDICAL RECORD NO.:  [id number]          PATIENT TYPE:  INP   LOCATION:  A313                          FACILITY:  [facility]   PHYSICIAN:  Boris M. Darovsky, M.D.DATE OF BIRTH:  [birth year]   DATE OF CONSULTATION:  04/16/2006  DATE OF DISCHARGE:                                   CONSULTATION   FOLLOWUP NOTE:  Mrs. Arnella Pralle is a 65 year old lady who initially was  seen by Dr. Glenford Peers, hematology/oncology, for evaluation for anemia.  At that time, it was felt that the condition was consistent with anemia of  chronic disease, and patient has been receiving transfusions p.r.n. as well  as erythropoietin support.  Also at this time, the patient is presenting  with acute renal failure followed by nephrology.  Further investigations  demonstrated increased amount of kappa and lambda chains in the urine, and  hematology input was requested.  Today on may evaluation, the patient denies  any fever or chills, but she does have some fatigue.  Denies any excessive  bleeding or bruising.  No body pain.  She has only arthritic type pain in  both knees.  She denies any chest pain, shortness of breath, any cough.  No  abdominal discomfort, no nausea, no vomiting, diarrhea, no constipation, no  neurological problems.   FAMILY HISTORY:  Positive for colon cancer in her sister at age 5, lung  cancer in her mother at age 26, and her son at age 48 was diagnosed with  lymphoma and apparently is currently in remission.   PHYSICAL EXAMINATION:  VITAL SIGNS:  Stable.  GENERAL:  Revealed a middle-aged lady in no acute distress.  Alert and  oriented x3.  Appropriate, pleasant.  SKIN: No hemorrhagic or pigmented lesions appreciated.  NECK:  Supple.  There is no thyromegaly or obvious masses present.  LYMPHADENOPATHY:  There is no peripheral lymphadenopathy throughout.  LUNGS:  Clear to auscultation.  HEART:  Regular rhythm and rate.  No S3,  S4.  ABDOMEN: Appears to be benign.  EXTREMITIES:  No edema, cyanosis, or clubbing.  NEUROLOGIC: Exam grossly unremarkable.   LABORATORY DATA:  Blood work from today shows hemoglobin of 9.6.  Creatinine  4, calcium 8.6.   Chest x-ray did not find any abnormalities.   Renal ultrasound was consistent with evidence of renal medical disease  present.  No evidence of hydronephrosis.   IMPRESSION:  1.  Normochromic normocytic anemia requiring packed red blood cell      transfusion with hemoglobin 9.6.  2.  Acute renal failure with creatinine of 4 and baseline creatinine of 1.1      with no obvious evidence of improvement.  3.  Elevated amount of kappa and lambda light chains in urine.  4.  Mild hypercalcemia, asymptomatic.  5.  Hypertension.  6.  Coronary artery disease.  7.  Sepsis, improved.  8.  Multiple medical problems followed by internal medicine physician.   RECOMMENDATIONS:  1.  Transfusion p.r.n. to maintain hemoglobin equal or around 10.  2.  Serum protein electrophoresis with immunofixation  test, urine protein      electrophoresis with immunofixation test 24-hour urine collection, LDH,      beta-2 microglobulin, reticulocytes low, iron studies.  3.  Bone survey to rule out multiple myeloma.  4.  Check stools for occult blood x3.  5.  The patient may require bone marrow biopsy as outpatient if the cause of      anemia is still not clear.  6.  Follow calcium levels.   Thank you very much for allowing me to participate in the care of this  patient.  We will follow with you.  If you have any questions, please do not  hesitate to contact me at [phone].           ______________________________  Lebron Conners. Ubaldo Glassing, M.D.     BMD/MEDQ  D:  04/16/2006  T:  04/16/2006  Job:  [id number]

## 2011-02-10 NOTE — Group Therapy Note (Signed)
[name], [name]                 ACCOUNT NO.:  [id number]   MEDICAL RECORD NO.:  [id number]          PATIENT TYPE:  INP   LOCATION:  A306                          FACILITY:  [facility]   PHYSICIAN:  Margaretmary Dys, M.D.DATE OF BIRTH:  06-Mar-1946   DATE OF PROCEDURE:  10/06/2005  DATE OF DISCHARGE:                                   PROGRESS NOTE   SUBJECTIVE:  Patient feels slightly better today.  She has had about 4-5  loose bowel motions, mostly in the morning, has not had any in the afternoon  at the time I saw her.  She has no fever or chills, no nausea or vomiting.  Patient had been seen by GI.   She has no fever or chills or rigors.  No abdominal pain.  She denies any  cramping, patient remains most pleasant.   OBJECTIVE:  She is alert, comfortable, not in acute distress, well oriented  in time, place and person.   VITAL SIGNS:  Blood pressure was 123/62, pulse of 106, respirations of 20,  Tmax was 101.1 degrees Fahrenheit.  Her blood sugars are ranging between 167-  421.  Weight was 152.3 kg.   HEENT EXAM:  Normocephalic, atraumatic, oral mucosa moist, no exudates were  noted.   NECK:  Supple, no JVD, no lymphadenopathy.   LUNGS:  Clear clinically, good air entry bilaterally.   HEART:  S1, S2 regular, no S3, S4, gallops or rubs.   ABDOMEN:  Soft, not tender, bowel sounds were positive, no masses palpable.   EXTREMITIES:  No pitting or pedal edema, no cough, induration or tenderness  was noted.   LABORATORY DATA:  White blood cell count was 9.4, hemoglobin of 8.6,  hematocrit of 25, platelet count was 311, sodium was 131, potassium 4.0,  chloride of 99, CO2 28, BUN of 14, creatinine was 1.0, PT 15.7, INR was 1.2,  PTT of 37.  Calcium was 7.9.  Stools for C. diff was negative, blood  cultures are negative, stools for WBC was also negative.  Urinalysis was  unremarkable.   ASSESSMENT AND PLAN:  1.  Acute diarrhea of about 19 days duration.  Etiology is unclear.   Clostridium difficile is currently negative, but she has been on      antibiotics for cellulitis and this is probably at least an antibiotic-      associated colitis.  We appreciate GIs input and will feel comfortable      with continuing her Flagyl for now.   She is currently not quite dehydrated, but will continue on IV fluids and  hold oral intake.  1.  Anemia, possibly of chronic disease and possible iron deficiency.      Patient does have a history of coronary artery disease which is not      amenable to surgery hece medical therapy.  I am concerned that she is at      risk for having a cardiac event if her H&H does not improve.  Lab will      recheck her H&H in the morning and if it is still  less than 10 and 30      will recommend transfusing her with 2 units of packed red blood cells.      Patient may need a colonoscopy at a later date when she is more stable.  2.  Coronary artery disease with severe three vessel disease.  The patient      had a cardiac catheterization at [facility] and was noted not to be a      surgical candidate for any kind of intervention.  3.  Cellulitis of the right foot.  Currently looks pretty clean and dry.      Will request Dr. Katrinka Blazing to look at it, will continue with daily dressing.      I do not see any evidence of surrounding cellulitis or an indication to      put her on any antibiotics for now for the cellulitis.  4.  Diabetes mellitus, on insulin; will continue to control her blood      sugars.  Continue insulin regimen and oral hypoglycemic agents.   DISPOSITION:  Will try to hydrate her and will follow GI recommendation in  terms of additional workup for this diarrhea.  She may need to have a  colonoscopy much later, as she had a myocardial infarction back in December.  If her diarrhea does improve, then this will be preferable.      Margaretmary Dys, M.D.  Electronically Signed     AM/MEDQ  D:  10/06/2005  T:  10/06/2005  Job:   [id number]

## 2011-02-10 NOTE — Discharge Summary (Signed)
[name], [name]                 ACCOUNT NO.:  [id number]   MEDICAL RECORD NO.:  [id number]          PATIENT TYPE:  INP   LOCATION:  A306                          FACILITY:  [facility]   PHYSICIAN:  Lonia Blood, M.D.      DATE OF BIRTH:  Jan 08, 1946   DATE OF ADMISSION:  10/05/2005  DATE OF DISCHARGE:  10/11/2005                                 DISCHARGE SUMMARY   PRIMARY CARE PHYSICIAN:  [facility].   DISCHARGE DIAGNOSES:  1.  Acute diarrhea, ruled out Clostridium difficile.  2.  A 25-pound weight loss of unknown cause.  3.  Diabetes type 2.  4.  Coronary artery disease.  5.  Cellulitis lower extremities.  6.  Transient hyponatremia, hyperkalemia and prerenal azotemia.  7.  Dehydration.  8.  History of cerebrovascular accident.  9.  Anemia.   DISCHARGE MEDICATIONS:  1.  Actos 30 mg daily.  2.  Augmentin 875 mg p.o. b.i.d.  3.  Lipitor 80 mg daily.  4.  Micardis/HCT 41/25 one tablet daily.  5.  Ferrous sulfate 325 mg b.i.d.  6.  Potassium chloride 20 mg daily.  7.  Metoprolol 25 mg b.i.d.  8.  Plavix 75 mg daily.  9.  Lasix 40 mg daily.  10. Cymbalta 30 mg daily.  11. Protonix 40 mg daily.  12. Phenergan 25 mg q.6h. p.r.n. for nausea.  13. Aspirin 81 mg daily.   DISPOSITION:  Patient is being discharged home.  She will follow up with  primary care physician at [facility] in about one to  two weeks.  Her CBGs have not normalized, however, patient has been off her  insulin and oral hyperglycemics when she was NPO.  She is following up  apparently with her endocrinologist tomorrow and she has been encouraged to  follow through.   PROCEDURE PERFORMED:  Chest x-ray performed on October 09, 2005, that showed  process of small right pleural effusion with basilar atelectasis versus  consolidation.   CONSULTATION:  Gastroenterology, Dr. Karilyn Cota.  Patient is to have colonoscopy  at some point as an outpatient.   BRIEF HISTORY AND  PHYSICAL:  Please refer to dictated history and physical  by Dr. Osvaldo Shipper.  It showed, however, patient is a 65 year old female  with multiple medical problems including diabetes, three vessel disease,  etc., who was treated for cellulitis with some Augmentin for about 10 days.  She came in with some diarrhea for two weeks.  On admission, patient was  found to have stable vital signs and was found to be orthostatic.  She had  dry mucous membranes.  She had some left lower quadrant tenderness but no  rebound or rigidity or guarding.  She was heme-negative at the time.  She  also had right lower extremity cellulitis evident half way up to the foot.  She has open wound noticed in the area of the second toe also.  She had a  white count of 12.6 with left shift and a platelet count of 331.  Sodium was  122, potassium 5.4.  Glucose 456, BUN 19, creatinine 1.4.  Albumin 2.5.   HOSPITAL COURSE:  PROBLEM #1 -  CHRONIC DIARRHEA:  Patient was admitted and  presumed to have C. difficile.  She was treated empirically for C.  difficile.  Her diarrhea has stopped since without any positive C. difficile  culture.  GI was consulted, especially with patient's report of weight loss.  Per GI, she will need a colonoscopy but only as an outpatient.  She has been  taking Flagyl 250 mg t.i.d. as well.  I will endeavor to send patient home  on the Flagyl as well which I will add to her medication regimen today.   PROBLEM #2 -  CELLULITIS:  Patient has been on antibiotics also for her  cellulitis.  At this point, we have switched her to oral antibiotics, mainly  Augmentin again, but she is also on Flagyl.  Hopefully that should control  her C. difficile that she had before or any antibiotic-associated diarrhea.   PROBLEM #3 -  HYPOKALEMIA:  Her potassium has been dropping steadily.  We  have repleted her potassium before and will continue to do that.  Patient  takes potassium supplements at home.   PROBLEM  #4 -  DIABETES:  Patient's blood sugars were okay on admission after  hydration.  Subsequently, she was NPO due to her symptoms.  As such, her  insulin and oral hyperglycemics were held.  We have, however, resumed them  now.  Her sugars are still not optimal but she has just resumed her oral  hyperglycemics.  She should follow up with her endocrinologist for further  management.   PROBLEM #5 -  DEHYDRATION:  As indicated, patient received multiple liters  of IV fluids.  She has since been well hydrated.  She is back on her regular  diet with no problems.   PROBLEM #6 -  HISTORY OF CORONARY ARTERY DISEASE:  This seemed to be stable  during this hospitalization.   PROBLEM #7 -  ANEMIA:  Her hemoglobin was down to 8.1.  In the setting of  coronary artery disease, this was found to be risky.  She was subsequently  transfused.  Her hemoglobin has been stable since transfusion of two units  packed red blood cells, currently it is at 11.   DISCHARGE LABORATORY DATA:  White count 9.4, hemoglobin 11, platelet count  317.  Sodium 133, potassium 3.1, chloride 104, CO2 27, BUN 27, creatinine  0.7, glucose 206, calcium 7.7.      Lonia Blood, M.D.  Electronically Signed     LG/MEDQ  D:  10/11/2005  T:  10/12/2005  Job:  [id number]

## 2011-02-10 NOTE — Group Therapy Note (Signed)
NAMEHENESSY, [name]                 ACCOUNT NO.:  [id number]   MEDICAL RECORD NO.:  [id number]          PATIENT TYPE:  INP   LOCATION:  A330                          FACILITY:  [facility]   PHYSICIAN:  Margaretmary Dys, M.D.DATE OF BIRTH:  04/15/1946   DATE OF PROCEDURE:  05/14/2006  DATE OF DISCHARGE:                                   PROGRESS NOTE   SUBJECTIVE:  Patient continues to do fairly well.  She denies  nausea/vomiting.  Her Reglan was switched to p.o. yesterday, and she has not  had any difficulty.  Her blood sugars have been very poorly controlled, and  her Lantus was then increased last night.   OBJECTIVE:  GENERAL:  Conscious, alert, comfortable, not in acute distress.  VITAL SIGNS:  Blood pressure 133/72, pulse of 71, respiration of 18.  T-max  was 98.4.  Blood sugars are ranged between 149 to 303.  HEENT:  Normocephalic, atraumatic.  Oral mucosa was moist, no exudates.  NECK:  Supple, no JVD or lymphadenopathy.  LUNGS:  good air entry bilaterally.  HEART:  S1, S2 regular.  No S3, S4, gallops or rubs.  ABDOMEN:  Soft, nontender, bowel sounds positive.  No muscles palpable.  EXTREMITIES:  No edema.   LABORATORY/DIAGNOSTIC DATA:  Basic metabolic:  Sodium was 134, potassium  3.6, chloride of 106, CO2 22, glucose 132, BUN of 33, creatinine was 2.3.  Calcium was 8.0.   ASSESSMENT/PLAN:  1. Diabetes gastroparesis, found by an abnormal gastric emptying study.      She has been started on Reglan 10 mg IV switched to p.o. yesterday.      Patient showed significant improvement.  Will continue to discuss      dietary modifications needed.  2. Poorly controlled diabetes.  Lantus dose again increased last night to      36 units subcu.  Will continue to monitor her blood sugars.  3. Chronic renal insufficiency.  BUN and creatinine is stable, potassium      on the lower limit of normal over replaced potassium.  4. Coronary artery disease.  Patient is stable with no chest pain.  5.  Urinary tract infection.  Patient remains on Cipro, has no fevers or      chills.   DISPOSITION:  Patient will likely be able to go home in the next 1 to 2  days, once blood sugar improves on the changes that were made last night.      Margaretmary Dys, M.D.  Electronically Signed     AM/MEDQ  D:  05/14/2006  T:  05/14/2006  Job:  [id number]

## 2011-02-10 NOTE — Consult Note (Signed)
[name], [name]                 ACCOUNT NO.:  [id number]   MEDICAL RECORD NO.:  [id number]          PATIENT TYPE:  INP   LOCATION:  A306                          FACILITY:  [facility]   PHYSICIAN:  Lionel December, M.D.    DATE OF BIRTH:  01-Nov-1945   DATE OF CONSULTATION:  10/06/2005  DATE OF DISCHARGE:                                   CONSULTATION   REFERRING PHYSICIAN:  Margaretmary Dys, M.D.   PRIMARY CARE PHYSICIAN:  Dr. Lindaann Pascal, [facility].   REASON FOR CONSULTATION:  Diarrhea, anemia and weight loss.   HISTORY OF PRESENT ILLNESS:  The patient is a 65 year old, Caucasian female  who was recently hospitalized in December for right foot cellulitis  requiring surgical debridement.  Postoperatively, she had flash pulmonary  edema and echocardiogram revealed hypokinesis at the apex, but EF was 55%.  Her cardiac enzymes were elevated.  She was transferred to [facility] for catheterization where she was determined to have severe three-  vessel coronary artery disease.  She had apical hypokinesis with confirmed  ejection fraction of 55%.  She was deemed to be nonsurgical candidate and is  being treated with medical therapy only.  According to the electronic  medical record, she had a non-Q wave MI.  During the hospitalization, she  required antibiotic therapy for cellulitis.  She was discharged on a 10-day  course of Augmentin.  About halfway through the course, she started having  nausea, vomiting and diarrhea.  She was having 5-6 loose, watery stools  daily.  Denies any blood in her stools or emesis.  She has had a lot of  mucous in her stools.  No fever or chills.  No real abdominal pain.  Denies  any heartburn.  She has had poor appetite.  Last time she vomited was  yesterday.  She has had four bowel movements already this morning.  She is  also concerned about weight loss.  She says when she went home from the  hospital she weighed 179 pounds.   However, upon review of EMR, she weighed  156 upon admission on August 30, 2005.  Potentially, she had a fluid weight  gain and is now at her baseline.  She weighed 152 on admission yesterday.  She has never had an EGD or colonoscopy.  It was recommended that she have  both of these during her last hospitalization because of anemia.  She has  been Hemoccult negative x2 at least.  Her hemoglobin is stable.  Today, it  is 8.6 and when she went home from the hospital, a couple of weeks ago, it  was 8.2.  She is on Plavix and aspirin at home and currently is on a heparin  drip.   MEDICATIONS PRIOR TO ADMISSION:  1.  Cymbalta 30 mg daily.  2.  Plavix 75 mg daily.  3.  Aspirin 81 mg daily.  4.  Lipitor 80 mg daily.  5.  Metoprolol 25 mg b.i.d.  6.  Lasix 40 mg daily.  7.  Micardis HCT 40/12.5 mg daily.  8.  Actos 30 mg daily.  9.  Lantus 10 units nightly.  10. Ferrous sulfate 325 mg b.i.d.  11. Protonix 40 mg daily.  12. Potassium chloride 20 mEq b.i.d.  13. Imodium p.r.n.  14. Phenergan p.r.n.   ALLERGIES:  CODEINE causes nausea and vomiting.   PAST MEDICAL HISTORY:  1.  Hyperlipidemia.  2.  History of CVA.  3.  Insulin-requiring diabetes mellitus.  4.  Severe three-vessel coronary artery disease.  5.  Nonsurgical candidate managed medically with recent non-Q wave MI as      outlined above.  6.  Right foot cellulitis, status post surgical debridement as outlined.      She is receiving wound care three times a week.  7.  Anemia, unclear etiology at this point.  8.  Status post hysterectomy.   FAMILY HISTORY:  Negative for colorectal cancer.  She has had two sisters  who have had colon polyps.  Her mother died of lung cancer.  She has a son  who is treated for lymphoma and was considered to be in remission a few  months ago.   SOCIAL HISTORY:  She lives with her fiance, Ree Kida Grooms.  She has good  family support and her children live nearby.  Denies any tobacco, alcohol or   drug use.   REVIEW OF SYSTEMS:  GASTROINTESTINAL:  See HPI.  CONSTITUTIONAL:  See HPI.  CARDIOPULMONARY:  She denies any chest pain or shortness of breath.  MUSCULOSKELETAL:  She denies any foot pain.   PHYSICAL EXAMINATION:  VITAL SIGNS:  T-max 101.1, T-current 99, pulse 106,  respirations 20, blood pressure 123/62, weight 152.3, height 64 inches.  GENERAL:  Pleasant, pale-appearing, Caucasian female in no acute distress.  SKIN:  Warm and dry.  She has somewhat of a yellow tone.  HEENT:  Sclerae are nonicteric.  Conjunctivae are pale.  Oropharyngeal  mucosa somewhat dry.  No erythema or exudate.  No lymphadenopathy or  thyromegaly.  CHEST:  Lungs clear to auscultation.  CARDIAC:  Regular rate and rhythm with a 3/6 murmur heard best at the apex.  ABDOMEN:  Positive bowel sounds, soft, nontender, nondistended, no  organomegaly or masses.  No rebound tenderness or guarding.  No abdominal  bruits or hernias.  EXTREMITIES:  No edema.  Her right foot has a dressing which is dry with no  drainage.  RECTAL:  Deferred as was done in ED and she was heme negative.   LABORATORY DATA AND X-RAY FINDINGS:  White count 9400 today, on admission  12,600, hemoglobin 8.6, hematocrit 25, platelets 311,000.  INR 1.2, PTT  normal.  Sodium 131, potassium 4, BUN 14, creatinine 1, glucose 267, on  admission 456.  Total bilirubin 0.9, Alk phos 93, SGOT 15, SGPT 12, albumin  2.5.  Hemoglobin on August 30, 2005, was 9.1.  On September 02, 2005, iron  was 16, TIBC 155.  Hemoglobin on August 30, 2005, revealed was 9.1.  On  September 02, 2005, iron was 16, TIBC 155, iron saturation 10%, B12 494,  folate 13.3, ferritin 428.   IMPRESSION:  The patient is a 65 year old, Caucasian female who is admitted  with a 2-week history of diarrhea.  Suspect Clostridium difficile colitis  given recent antibiotic use in hospitalization.  With regards to her chronic anemia, she has had two Hemoccult negative stools.  Iron studies  appear to  reveal mixed pattern, possibly anemia of chronic disease and iron  deficiency.  She has never had a colonoscopy and would recommend  one at some  point, but it is not clear whether she will need this during the  hospitalization or as an outpatient.  If diarrhea is persistent, she will  need it during this hospitalization.  If colonoscopy unremarkable, she will  also need to have an esophagogastroduodenoscopy for evaluation of anemia.  With regards to weight loss, this may be more or less related to decreased  fluid weight.  She is noted to have weighed 156 on August 30, 2005, and had  gained up to 179 at time of her discharge in December.  Now, she is at 73  which is probably her baseline.   RECOMMENDATIONS:  1.  Follow up stool studies as available.  2.  Agree with Flagyl.  3.  She will need to have a colonoscopy and possible EGD for evaluation of      anemia given non-Q wave MI in December.  This may need to be done on an      outpatient basis.  Will discuss further with Dr. Karilyn Cota.      Tana Coast, P.A.      Lionel December, M.D.  Electronically Signed    LL/MEDQ  D:  10/06/2005  T:  10/06/2005  Job:  [id number]   cc:   Lindaann Pascal, M.D.  [facility]

## 2011-02-10 NOTE — Group Therapy Note (Signed)
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  She has a supinated,  inverted left foot with weak dorsiflexion and then has weak dorsiflexion on  the right.  She has frequent falls.  I have ordered two AFOs for her, which  should help her gait.      Vickki Hearing, M.D.  Electronically Signed     SEH/MEDQ  D:  04/12/2006  T:  04/12/2006  Job:  [id number]

## 2011-02-10 NOTE — Op Note (Signed)
[name], [name]                 ACCOUNT NO.:  [id number]   MEDICAL RECORD NO.:  [id number]          PATIENT TYPE:  INP   LOCATION:  A302                          FACILITY:  [facility]   PHYSICIAN:  Jerolyn Shin C. Katrinka Blazing, M.D.   DATE OF BIRTH:  [birth year]   DATE OF PROCEDURE:  01/16/2006  DATE OF DISCHARGE:                                 OPERATIVE REPORT   PREOPERATIVE DIAGNOSIS:  Osteomyelitis, right foot.   POSTOPERATIVE DIAGNOSIS:  Osteomyelitis. right foot.   PROCEDURE:  Left subclavian Port-A-Cath insertion.   SURGEON:  Dirk Dress. Katrinka Blazing, M.D.   ANESTHESIA:  The procedure was done with MAC anesthesia with heavy IV  sedation and 1% local infiltration.   DESCRIPTION OF PROCEDURE:  The patient was taken to the operating suite.  Her left subclavian area and neck were prepped and draped into a sterile  field.  Local infiltration with 1% Xylocaine was carried out using the  standard technique.  The patient was placed in Trendelenburg position.  The  left subclavian vein was accessed with a single stick.  Guidewire was placed  uneventfully.  The catheter was tunneled and a pocket was developed.  Using  an introducer set, the catheter was then positioned in the superior vena  cava without difficulty.  It was attached to the chamber and the over-sleeve  was placed.  The system was flushed with heparin flush.  The chamber was  placed in the pocket.  Position was confirmed by fluoroscopy.  The chamber  was then sutured to the fascia with 3-0 Prolene.  The pocket was closed with  3-0 Vicryl and 4-0 Vicryl.  OpSite sterile dressing was placed.  First, the  port was accessed with a Huber needle.  The entire system was flushed with a  final flush of 4000 units of heparin.  The needle assembly was covered with  an additional OpSite.  The patient tolerated the procedure well.  She was  transferred to her bed and taken to the postanesthetic care unit in  satisfactory condition.      Dirk Dress. Katrinka Blazing,  M.D.  Electronically Signed     LCS/MEDQ  D:  01/16/2006  T:  01/17/2006  Job:  [id number]

## 2011-02-10 NOTE — Group Therapy Note (Signed)
[name], [name]                 ACCOUNT NO.:  [id number]   MEDICAL RECORD NO.:  [id number]          PATIENT TYPE:  INP   LOCATION:  A330                          FACILITY:  [facility]   PHYSICIAN:  Margaretmary Dys, M.D.DATE OF BIRTH:  1946/01/23   DATE OF PROCEDURE:  05/10/2006  DATE OF DISCHARGE:                                   PROGRESS NOTE   SUBJECTIVE:  Patient feels slightly better today.  Denies any nausea, no  vomiting.  She feels actually much better, ever since being in the hospital,  with no abdominal pain, no diarrhea.  Patient is scheduled for a gastric  emptying study today.   OBJECTIVE:  GENERAL:  Conscious, alert, comfortable, pleasant, not in acute  distress.  VITAL SIGNS:  Blood pressure 115/68.  Pulse is 64, respirations 20, T-max  98.9.  Blood sugars have ranged between 241 to 347.  HEENT:  Normocephalic, atraumatic.  Oral mucosa was moist with no exudates.  NECK:  Supple.  No JVD, no lymphadenopathy.  LUNGS:  Clear clinically.  Good air entry bilaterally.  HEART:  S1, S2, regular and no S3, S4, gallops or rubs.  ABDOMEN:  Soft, nontender.  Bowel sounds were positive.  No mass was  palpable.  EXTREMITIES:  No edema.  Patient continues to have an ulcer on the sole of  her right foot which is about 1.5 x 1.5 cm with a well-healed surgical wound  on the dorsum of the right foot.  CNS:  Grossly intact with no focal neurologic deficits.   LABORATORY/DIAGNOSTIC DATA:  Sodium was 132, potassium 3.9, chloride of 101,  CO2 was 25, glucose 259, BUN of 51, creatinine 3.2.  Calcium is 8.1.   ASSESSMENT/PLAN:  1. Poorly-controlled diabetes.  Patient's Lantus doses have been adjusted.      Will continue to watch for improvement.  2. Intractable nausea with vomiting.  I agree that this may likely be      underlying diabetic gastroparesis.  Patient is currently on intravenous      Protonix.  She is awaiting a gastric emptying study.  3. Chronic renal insufficiency  secondary to diabetes.  Patient is stable.      Her BUN and creatinine function is stable at this time.  4. Coronary artery disease, stable.  Patient has no chest pain.  5. Additional risk of urinary tract infection.  Remains on Cipro.      Continue on this treatment for now.  6. Diabetic neuropathy.  Patient's symptoms slightly improved.  She is      taking Cymbalta for this.   DISPOSITION:  Patient is overall doing much better today, has had no nausea  or vomiting.  I think we will pursue with a gastric emptying study to figure  out the underlying reason .  She will continue on Reglan which was started  on admission to help with intestinal motility.  I have discussed the above  plan with her and she verbalized full understanding.  Her electrolytes have  improved.  BUN and creatinine is essentially stable from previous.  Margaretmary Dys, M.D.  Electronically Signed     AM/MEDQ  D:  05/10/2006  T:  05/10/2006  Job:  [id number]

## 2011-02-10 NOTE — Consult Note (Signed)
[name], [name] NO.:  [id number]   MEDICAL RECORD NO.:  [id number]          PATIENT TYPE:  INP   LOCATION:  4733                         FACILITY:  [facility]   PHYSICIAN:  Kerin Perna, M.D.  DATE OF BIRTH:  1946/07/28   DATE OF CONSULTATION:  09/12/2005  DATE OF DISCHARGE:                                   CONSULTATION   PHYSICIAN REQUESTING CONSULTATION:  Vida Roller, M.D.   PRIMARY CARE PHYSICIAN:  Dr. Rito Ehrlich in [place].   CONSULTANT:  Kerin Perna, M.D.   REASON FOR CONSULTATION:  Severe three vessel coronary artery disease with  congestive heart failure.   CHIEF COMPLAINT:  Shortness of breath.   HISTORY OF PRESENT ILLNESS:  I was asked to evaluate this 65 year old white  female diabetic for potential surgical coronary revascularization for  recently diagnosed severe three vessel coronary artery disease.  The patient  underwent right foot debridement for diabetic necrosis in [place] by Dr.  Elpidio Anis.  Shortly postoperatively she developed flash pulmonary edema  and had positive cardiac enzymes.  She underwent cardiac catheterization  today by Dr. Juanda Chance which demonstrated severe three vessel coronary disease  with diffuse diabetic disease of the LAD, circumflex and right coronary.  The vessels were quite small and measured less than 1 mm in diameter.  Her  ejection fraction was 55%.  Her filling pressures were elevated with left  ventricular end diastolic pressure of 23 mmHg, a wedge pressure of 23, with  PA pressures of 45/25.  Because of her severe coronary disease, she is felt  to be a potential candidate for surgical revascularization.  The patient  currently denies chest pain.  Her shortness of breath has improved since her  therapy with Lasix diuresis.  She is receiving IV antibiotics for the  necrotic foot wound, which is growing a streptococcal organism.  She is  receiving IV antibiotics.   PAST MEDICAL HISTORY:  1.   Diabetes mellitus.  2.  Hypertension.  3.  Chronic anemia of unknown origin.  4.  No known allergies.  5.  Cerebrovascular disease followed by Dr. Madilyn Fireman.  Last seen 2 years ago.      Her current Doppler studies indicate a tight left carotid stenosis of      probably 90%.   CURRENT MEDICATIONS:  1.  Lantus insulin b.i.d..  2.  Actos 30 mg daily.  3.  Micardis daily.  4.  Unasyn.  5.  Lovenox.  6.  Levaquin.  7.  Lasix.  8.  Cymbalta.  9.  Avapro.  10. Cleocin.   SOCIAL HISTORY:  The patient is single and lives in [place].  She is a  Chief Strategy Officer at [employer].  She stopped smoking  35 years ago, and denies alcohol intake.   FAMILY HISTORY:  Positive for diabetes, positive for coronary disease in her  parents.   REVIEW OF SYSTEMS:  CONSTITUTIONAL:  Reviewed and is negative for fever or  chills.  ENT:  Negative for headache, difficulty swallowing or active dental  complaints.  CARDIOPULMONARY:  Positive for  her CHF and severe three vessel  coronary disease and pulmonary edema.  UROLOGIC:  Negative for kidney  stones.  NEUROLOGIC:  Negative for weakness or stroke.  Positive for  neuropathy of the lower extremities.  GU:  Positive for nausea.  No history  of blood per rectum.   PHYSICAL EXAMINATION:  VITAL SIGNS:  The patient is 5 feet, 4 inches and  weight 178 pounds.  Blood pressure 130/80, pulse 80, temperature 99.4.  GENERAL APPEARANCE:  A well-developed, pleasant, middle-aged white female in  no acute distress with her right leg wrapped in a surgical dressing from the  foot to the mid tibia.  HEENT:  Normocephalic.  Extraocular movements full.  NECK:  Bilateral carotid bruits, left greater than right.  No JVD or mass.  CARDIOVASCULAR:  Regular rhythm without an S3 gallop or murmur.  ABDOMEN:  Soft and nontender.  LUNGS:  Clear to auscultation.  There is no thoracic deformity.  EXTREMITIES:  No clubbing or cyanosis.  She has edema, erythema,  and  necrotic tissue still remaining in the right foot.  NEUROLOGIC:  Nonfocal.   LABORATORY DATA:  I reviewed the coronary arteriograms, chest x-ray studies  and 2-D echo.  She has a right pleural effusion probably related to CHF.  She has severe three vessel coronary disease with a diffuse diabetic pattern  and extremely poor targets for grafting.  Her left ventricular systolic  function is fairly well preserved.  Her blood cultures have grown out a  streptococcus species.   IMPRESSION AND RECOMMENDATIONS:  The patient has had a postoperative non-Q  wave MI with congestive heart failure and has been found to have severe  three vessel disease.  She denies any angina.  Because of the poor targets  in her coronary circulation, I would recommend bypass surgery, but would  recommend instead medical therapy.  This was discussed with the patient and  all questions addressed.   Thank you for the consultation.      Kerin Perna, M.D.  Electronically Signed     PV/MEDQ  D:  09/12/2005  T:  09/12/2005  Job:  [id number]

## 2011-02-10 NOTE — Group Therapy Note (Signed)
[name], [name]                 ACCOUNT NO.:  [id number]   MEDICAL RECORD NO.:  [id number]          PATIENT TYPE:  INP   LOCATION:  A330                          FACILITY:  [facility]   PHYSICIAN:  Margaretmary Dys, M.D.DATE OF BIRTH:  12-23-1945   DATE OF PROCEDURE:  03/10/2006  DATE OF DISCHARGE:                                   PROGRESS NOTE   SUBJECTIVE:  The patient is doing fairly well.  She complains of no  headache, dizziness or lightheadedness, no shortness of breath, no chest  pain, no hematemesis.  She had an EGD yesterday which was really  unremarkable.   OBJECTIVE:  GENERAL:  Conscious, alert, comfortable, not in acute distress.  VITAL SIGNS:  Blood pressure is 155/77, pulse 94, respirations 18,  temperature 98.1, blood sugar range between 179 to 267.  HEENT:  Normocephalic, atraumatic.  Oral mucosa was moist.  No exudates were  noted.  NECK:  Supple, no JVD, no lymphadenopathy.  LUNGS:  Clear with good air entry bilaterally.  HEART:  S1 and S2 regular.  No S3, S4, gallops or rubs.  ABDOMEN:  Soft and nontender, bowel sounds positive. No mass palpable.  EXTREMITIES:  No edema.   LABORATORY/DIAGNOSTIC DATA:  White blood cell count 10.3, hemoglobin 10,  hematocrit 28.6, platelet count 320.  There was no left shift.   Sodium 136, potassium 3.1, chloride 100, cO2 of 27, glucose 229.  BUN 21,  creatinine was 2.0.  Calcium 8.1.   ASSESSMENT AND PLAN:  1.  Anemia, etiology remains unclear.  Patient really has unremarkable      endoscopy and colonoscopy. H&H are slightly down today so I recommend      rechecking again tomorrow to ensure that is no continued downward trend.      GI has recommended restarting her Plavix which I informed the patient      may be able to.  2.  Diabetes mellitus, poorly controlled. Will continue to watch her.  I      think she may need to be started on Lantus insulin.  3.  Renal insufficiency, probably not chronic.  Baseline is likely around     where it is now although I did notice that it may have been 1.2 in the      past.  We will continue to monitor her closely.  4.  Osteomyelitis of the right foot.  She is on IV vancomycin and      doxycycline at home which is being continued.  5.  Hypokalemia.  Will replace potassium today.   DISPOSITION:  Will  monitor H&H and replace potassium, recheck blood work in  the morning.      Margaretmary Dys, M.D.  Electronically Signed     AM/MEDQ  D:  03/10/2006  T:  03/10/2006  Job:  [id number]

## 2011-02-10 NOTE — Group Therapy Note (Signed)
[name], [name]                 ACCOUNT NO.:  [id number]   MEDICAL RECORD NO.:  [id number]          PATIENT TYPE:  INP   LOCATION:  A313                          FACILITY:  [facility]   PHYSICIAN:  Margaretmary Dys, M.D.DATE OF BIRTH:  1946/01/13   DATE OF PROCEDURE:  04/08/2006  DATE OF DISCHARGE:                                   PROGRESS NOTE   SUBJECTIVE:  The patient is doing much better today. Sitting up in a chair.  She denies any fevers or chills. She does have some mild pain over where her  Porta-cath was taken out. Otherwise, feels well. Continues followup by  nephrology due to worsening kidney function.   OBJECTIVE:  GENERAL:  Conscious, alert, and pleasant. Not in acute distress.  VITAL SIGNS:  Blood pressure 141/83. Pulse of 88. Respiratory rate 16.  Temperature 98.4. Oxygen saturation is 97% on room air. Blood sugars are  ranging between 239 to 480.  HEENT:  Normocephalic and atraumatic. Moist oral mucosa. No exudates were  noted.  NECK:  Supple. No JVD. No lymphadenopathy.  LUNGS:  Reduced air entry bilaterally. Occasional crackles at the bases.  HEART:  S1 and S2 regular.  ABDOMEN:  Soft, nontender. Bowel sounds positive.  EXTREMITIES:  No edema.   LABORATORY DATA:  White blood cell count ws 14.5, which is slightly  improved. Hemoglobin 11.8 and hematocrit 34. Platelet count 265,000.  Neutrophils 76%.  Sodium was 126, potassium 4.5, chloride 92, CO2 26,  glucose 437, BUN of 40, creatinine was 4.8, calcium 7.6. The patient's BUN  and creatinine is not improving.   ASSESSMENT/PLAN:  1.  Infected Porta-cath. The patient is much better after removal of the      Porta-cath. She remains on Diflucan 200 mg IV q.24.  2.  Worsening kidney function. Dr. Kristian Covey is seeing the patient. She      received some IV fluids with normal saline but the kidney function does      not appear to be improving. I am not sure if dialysis has been discussed      with the patient but will  defer to the nephrologist on this.  3.  Coronary artery disease, which is stable. Remains on her cardiac      medication at this time.  4.  Poorly controlled diabetes. Will continue current sliding scale. I have      returned her back to her home medication of oral hypoglycemic agents and      Lantus, which had been on hold since admission.   Will continue to follow.     Margaretmary Dys, M.D.  Electronically Signed    AM/MEDQ  D:  04/08/2006  T:  04/08/2006  Job:  [id number]

## 2011-02-10 NOTE — H&P (Signed)
[name], HOPKINS NO.:  [id number]   MEDICAL RECORD NO.:  [id number]          PATIENT TYPE:  INP   LOCATION:  A218                          FACILITY:  [facility]   PHYSICIAN:  Osvaldo Shipper, MD     DATE OF BIRTH:  [birth year]   DATE OF ADMISSION:  04/05/2006  DATE OF DISCHARGE:  LH                                HISTORY & PHYSICAL   PRIMARY CARE PHYSICIAN:  Dr. Lindaann Pascal at [facility]   GENERAL SURGEON:  Dr. Elpidio Anis   CARDIOLOGIST:  Dr. Vida Roller   ADMISSION DIAGNOSES:  1.  Severe anemia.  2.  Acute on chronic renal failure.  3.  Nonketotic hyperosmolar state.  4.  Uncontrolled diabetes.  5.  Right foot osteomyelitis on intravenous antibiotics.   CHIEF COMPLAINT:  Abnormal blood work and feeling weak with nausea and  vomiting for the past 2 weeks.   HISTORY OF PRESENT ILLNESS:  The patient is a 65 year old Caucasian female old Caucasian female  who has been admitted to our service over the past few months, multiple  times, who was seen by her primary care physician's office to the ED because  of abnormal blood work. The patient mentioned that she has been feeling weak  and tired over the past 2 weeks. She has been having nausea, vomiting and  loose stools over the duration of the past few weeks. She has also been  feeling light headed but has not had any syncopal episodes.  Her p.o. intake  has been very poor because of her symptoms. She denies any pain otherwise.  Denies any fever. Has been having some chills.  She mentioned that her right  foot is healing very well and her last appointment with Dr. Katrinka Blazing was 2-3  weeks ago.   MEDICATIONS AT HOME:  1.  Monopril (unknown dose daily).  2.  Lasix 40 mg daily.  3.  Vancomycin IV.  4.  Doxycycline IV.  5.  Prevacid 30 mg p.r.n.  6.  Lantus Insulin 15 units b.i.d.  7.  She also takes possibly Humalog on a sliding scale.  8.  Plavix 75 mg once daily.  9.  Lipitor 80 mg at h.s.  10. Protonix 40  mg daily.  11. Metoprolol 25 mg b.i.d.  12. Actos 30 mg daily.  13. Miocardia HCT 40/12.5 once daily.  14. Ranitidine 150 mg b.i.d.   Please note, the patient is not very sure of all of her medications hence  this list may not be accurate.   ALLERGIES:  No known drug allergies.   PAST MEDICAL HISTORY:  1.  Diabetes x30 years.  2.  History of pulmonary edema back in December.  3.  History of severe coronary artery disease on medical management only.  4.  History of hypertension.  5.  History of right foot osteomyelitis.  6.  History of severe anemia of unclear etiology in the past. The patient      has had a colonoscopy recently, back in June of 2007 which was actually      normal. She also  had an EGD at the same time that showed erosive reflux      esophagitis and multiple erosions of the gastric antrum. She was      transfused multiple units of blood over the past few admissions.  7.  History of chronic renal insufficiency.   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  Lives in [place]. No alcohol use. No tobacco use. No illicit drug use.   FAMILY HISTORY:  Noncontributory at this time.   REVIEW OF SYSTEMS:  A 10 point review of systems was remarkable just for  weakness over the past few days, otherwise unremarkable.   PHYSICAL EXAMINATION:  VITAL SIGNS:  Temperature 97.6, blood pressure  106/63, heart rate is 100, respiratory rate is 16, saturation 100% on room  air.  GENERAL EXAM:  Well-developed, well-nourished individual in no apparent  distress. She looks very weak.  HEENT:  There is no icterus. Mild pallor is appreciated. Oral mucosa dry, no  oral lesions are noted.  LUNGS:  Clear to auscultation bilaterally.  CARDIOVASCULAR:  S1, S2 is normal, regular. A systolic murmur is appreciated  at the aortic area.  ABDOMEN:  Soft, nontender, nondistended, bowel sounds are present. No mass  or organomegaly is appreciated.  EXTREMITIES:  Without  edema.  RIGHT FOOT:  Has a healed surgical wound anteriorly and also on the plantar  aspect wound covered with a dressing. This is about 1 cm deep, measures  about 3 cm in length, oval in shape. No active drainage is noted, there is  granulation tissue noted.   LABORATORY DATA:  White count is 14,600 with 89% neutrophils, no bands are  reported yet, hemoglobin is 7.4, MCV is 86, platelet count 321,000. Sodium  is 120, corrected it is about 131, glucose is 838, potassium is 4.8,  chloride 85, bicarb 26, BUN is 48, creatinine is 4.8. Her baseline  creatinine is around 2, back in April it was 1.2.   Liver function tests are normal, albumin is 2.1.   No imaging studies have been done.  EKG has been done which shows a sinus  rhythm with a normal axis. No concerning ST or T wave changes are noted.   IMPRESSION:  This is a 65 year old Caucasian female with multiple medical  problems as outlined earlier, who presents with hyperglycemia, acute on  chronic renal failure, and severe anemia.   PLAN:  1.  Nonketotic hyperosmolar state - will give her IV fluids, put her on IV      Insulin, observe with Glucomander. Hopefully with this her blood sugar      should correct.  2.  Severe anemia. Transfuse with three units of blood as she does have      history of coronary artery disease and her H&H should be about 10.      Etiology for the anemia is not very clear. She has had a GI work up with      no clear etiology.  She has had occult blood testing in the past and I      am not able to find the results of that in the computer at this time.  I      think at this point she may need to be seen by a hematologist although      she has had iron peripheral studies done in the past, her iron was 16,      TIBC 155, percent saturation 10, but ferritin was  428. This is from      December of 2006.  3.  Acute on chronic renal failure possibly from dehydration. Other     etiologies include ATN and medication  use. We will hold her ACE      inhibitor, her diuretics, her ARB.  4.  History of coronary artery disease, stable. Continue the aspirin but      will hold her Plavix for now. Hold the beta blocker as she can get      hypotensive. Her blood pressure is already borderline.  5.  Right foot osteomyelitis. Will check a random vancomycin level and dose      her with vancomycin as needed.   Further management decision will be based on results of initial testing and  patient's response to treatment.      Osvaldo Shipper, MD  Electronically Signed     GK/MEDQ  D:  04/05/2006  T:  04/05/2006  Job:  [id number]   cc:   Lindaann Pascal, M.D.  [facility]   Jerolyn Shin C. Katrinka Blazing, M.D.  Fax: [phone]   Vida Roller, M.D.  Fax: [phone]

## 2011-02-10 NOTE — Consult Note (Signed)
NAMEJAYANNA, [name]                 ACCOUNT NO.:  [id number]   MEDICAL RECORD NO.:  [id number]          PATIENT TYPE:  INP   LOCATION:  A218                          FACILITY:  [facility]   PHYSICIAN:  Ladona Horns. Neijstrom, MD  DATE OF BIRTH:  09/27/1945   DATE OF CONSULTATION:  04/06/2006  DATE OF DISCHARGE:                                   CONSULTATION   DIAGNOSES:  1.  Severe anemia consistent with anemia of chronic disease.  2.  Chronic renal failure.  3.  Diabetes mellitus times many years with poor control.  4.  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      Elpidio Anis.  5.  Heart murmur that she states she has had for many many years.  6.  Hypercholesterolemia for which she is on Lipitor for.  7.  Weight reduction of approximately 10% her body weight in the last 6      months with the use of these antibiotics and this osteomyelitis.  8.  History of hypertension.  9.  History of congestive heart failure in December2006.   HISTORY:  This is a pleasant 65 year old Caucasian lady from [place] who was  admitted with a very low hemoglobin in the setting of hyperkeratotic  uncontrolled diabetes mellitus.  Her hemoglobin was 7.4.  She was given 1  unit of blood and it rose to 8.4 this morning; she is receiving a second  unit during this consultation.  Her white count was 14,600 on admission; and  she did have a left shift, platelets were 321,000.  Differential showed 89%  neutrophils, 6% lymphocytes, 5% monocytes.  Her sodium was also low probably  delusional secondary to the very high sugar.  The sugar was 838 on  admission.  Her BUN was 48 and creatinine 4.8.  Today's values show BUN of  40, creatinine 4.2.  Her total protein was 5.7; albumin was 2.1.  Liver  enzymes are unremarkable.   She has had no fever that she is aware of prior to this admission, nor has  she had one since she  has been admitted.  Her pH on this admission through a  vein was 7.32802, pCO2 of 46.9.  She did not have arterial blood gases.   Her review of systems otherwise is noncontributory.  She has not seen blood  in her stools, no blood in her urine.  She has not been coughing up blood.  She does not have nosebleeds, etcetera.   She is no longer menstruating; it has been several years.  She is separated  from her husband x7 years.  She has three children in good health to the  best for knowledge.   She was not aware of distinct allergies.   Her exam today, vital signs showed that she was afebrile.  Pulse is right  around the 110 and regular, respirations 18 and unlabored.  Her blood  pressure was 114/60.  She had pale-appearing skin.  She had pupils which did  respond to light.  She had false teeth upper and lowers.  Tongue was  unremarkable.  She had a heart murmur with a grade 2/6 systolic ejection  murmur.  Lungs though were clear.  Breast exam was negative for masses.  Abdomen was soft and nontender without hepatosplenomegaly.  She had a very  soft abdomen.  Bowel sounds were normal.  She had a bandage over the dorsum  of the right foot; I did not remove that.  Her pulses in her feet were  diminished to absent.   This lady appears have an anemia of chronic disease picture.  We will check  for other causes, but I think we ought to start her on Aranesp empirically,  at this time, to see if we can avoid future transfusions.  I suspect that  her renal failure may not necessarily improve significantly; and her  diabetes which has not been well controlled, possibly because of a  contributing factor from the osteomyelitis; may be a real problem going  forward, but we will start her on therapy; see her back as an outpatient.      Ladona Horns. Mariel Sleet, MD  Electronically Signed     ESN/MEDQ  D:  04/06/2006  T:  04/06/2006  Job:  [id number]   cc:   Ernestina Penna, M.D.  Fax: [phone]

## 2011-02-10 NOTE — Group Therapy Note (Signed)
[name], [name]                 ACCOUNT NO.:  [id number]   MEDICAL RECORD NO.:  [id number]          PATIENT TYPE:  INP   LOCATION:  A306                          FACILITY:  [facility]   PHYSICIAN:  Margaretmary Dys, M.D.DATE OF BIRTH:  Oct 25, 1945   DATE OF PROCEDURE:  10/09/2005  DATE OF DISCHARGE:                                   PROGRESS NOTE   SUBJECTIVE:  The patient is doing very well.  She has had only one bowel  movement this morning and it was slightly well formed.  She has no fever or  chills, no abdominal pain, no nausea/vomiting.  She is in good spirits as  usual and very pleasant.  Energy level is also improved.   OBJECTIVE:  She is alert, comfortable, not in acute distress.  VITAL SIGNS:  Blood pressure 127/62, pulse 95, respirations 20, temperature 98 degrees  Fahrenheit.  Blood sugars have ranged between 195-336.  HEENT:  Normocephalic, atraumatic.  Oral mucosa was moist.  No exudates.  NECK:  Supple, no JVD, no lymphadenopathy.  LUNGS:  Clear clinically.  HEART:  S1  and S2 regular, no S3 or S4, gallops or rubs.  ABDOMEN:  Soft, nontender,  bowel sounds positive, no mass palpable.  EXTREMITIES:  No edema.  Her right  foot cellulitis continues to improve.   LABORATORY DATA:  White blood cell count today is 9.7, hemoglobin is down to  8.1, hematocrit 23.1, platelet count 338.  Sodium 134, potassium 3.3,  chloride 107, CO2 25, glucose 175, BUN 3, creatinine 0.7, calcium 7.3.  C-  diff remained negative.   ASSESSMENT AND PLAN:  1.  Diarrhea of unclear etiology, but this has remarkably improved.  Stool      studies have been negative.  She remains on Flagyl 250 mg p.o. t.i.d.      Plan is for colonoscopy as an outpatient.  2.  Dehydration.  The patient is much better.  IV fluids have been      discontinued and oral intake has improved.  3.  Anemia.  Hemoglobin is down again to 8.1 and 23.1.  We have discussed in      detail the risks of her severe anemia in the setting  of her severe      coronary artery disease.  The patient has agreed to blood transfusion.      I will transfuse her with 2 units of packed red blood cells.  Benefits      and risks of transfusion were discussed.  4.  Coronary artery disease.  Stable.  The patient has no chest pain.  5.  Right foot cellulitis.  Will continue the current dressing with      Bactroban and hydrogel.  I will discontinue her Unasyn and switch her to      Augmentin 875 mg p.o. b.i.d.  6.  Disposition.  Will put a PICC line in her and proceed with blood      transfusion today.  I think once her H&H stabilizes above 10 and 30, the      patient may be discharged  home.      Margaretmary Dys, M.D.  Electronically Signed     AM/MEDQ  D:  10/09/2005  T:  10/09/2005  Job:  [id number]

## 2011-02-10 NOTE — Op Note (Signed)
NAMETAMEE, BATTIN NO.:  [id number]   MEDICAL RECORD NO.:  [id number]          PATIENT TYPE:  INP   LOCATION:  A218                          FACILITY:  [facility]   PHYSICIAN:  Barbaraann Barthel, M.D. DATE OF BIRTH:  1946/08/04   DATE OF PROCEDURE:  04/06/2006  DATE OF DISCHARGE:                                 OPERATIVE REPORT   NOTE:  Surgery was asked to see this 65 year old white female who had a Port-  A-Cath placed several weeks ago for the treatment of osteomyelitis.  She had  a positive blood culture for fungus, the hospital team asked me to remove  this catheter.  Gross operative findings were none consistent with any  obvious infection in this area.  We cultured the catheter tip and removed  the apparatus uneventfully.   SPECIMEN:  Cultures of catheter tip.   TECHNIQUE:  The patient was placed in the supine position with the head down  in Trendelenburg.  The left hemithorax was prepped with Betadine solution  and draped in the usual manner. Using 1% Xylocaine without epinephrine, an  incision was made over the infusion device which was located centrally and  lower than normal and this was then removed and the pseudo-capsule where the  catheter tip was removed was clamped and ligated with 3-0 Prolene.  We  debrided the pseudo-capsule and irrigated with normal saline copiously and  controlled any subcutaneous bleeding with the cautery device.  The catheter  tip was then placed directly into a culture medium. After irrigating and  making sure that hemostasis was complete, I then closed the wound with a  running 5-0 nylon monofilament suture.  Prior to closure, all sponge,  needle, and instrument counts were found to be correct.  Estimated blood  loss was minimal.  The patient received 200 mL crystalloids  interoperatively.  There were no complications.  This procedure was done  under local anesthesia with IV sedation and will be returned with no change  of  service or change in preoperative diet, activity or medications.  This  patient returns to the hospitalist team.  The sutures need to be removed in  10-14 days.      Barbaraann Barthel, M.D.  Electronically Signed     WB/MEDQ  D:  04/06/2006  T:  04/07/2006  Job:  [id number]

## 2011-02-10 NOTE — Group Therapy Note (Signed)
[name], [name] NO.:  [id number]   MEDICAL RECORD NO.:  [id number]          PATIENT TYPE:  INP   LOCATION:  A214                          FACILITY:  [facility]   PHYSICIAN:  Osvaldo Shipper, MD     DATE OF BIRTH:  [birth year]   DATE OF PROCEDURE:  09/07/2005  DATE OF DISCHARGE:                                   PROGRESS NOTE   BRIEF HOSPITAL COURSE:  This is a 65 year old Caucasian female who has a  history of diabetes, hypertension.  She was admitted with soreness and  redness in the right foot.  She was found to have significant cellulitis.  The patient was initially started on Unasyn.  She was consulted to be seen  by Dr. Katrinka Blazing as well.  The patient underwent debridement of her right foot  by Dr. Katrinka Blazing.  The patient's antibiotics were changed; initially vancomycin  and rifampin was added.  Subsequently rifampin was discontinued and Levaquin  and clindamycin were added.  Currently vancomycin has been discontinued.   For the past two days the patient has been afebrile.  Her white count has  being going down.  According to Dr. Katrinka Blazing, her right foot is also looking  better.  However, he still feels that the patient needs to stay in the  hospital for at least 2 more days, to make sure her foot is heading in the  right direction.   DIABETES:  Her diabetes has been under very good control.  She is on Lantus  insulin, along with Actos.  She has been doing reasonably well.   HYPERTENSION:  Her blood pressures have also been under reasonable control.   PULMONARY EDEMA:  On September 05, 2005 in the evening the patient became  short of breath.  CT of the chest was initially ordered to rule out PE; and  the PE was ruled out.  However, the chest CT showed significant pulmonary  edema.  The patient was on IV fluids, though it was not running at such a  high rate.  The patient does not have any heart disease as such.  Her CKs  have been normal; however, her Troponin has been  in the 0.20 range.  It is  likely that the Troponin elevation is because of the pulmonary edema; less  likely that the patient had a primary cardiac event.   Because the patient is a diabetic, we are getting an echocardiogram on this  patient.  This is pending as of now.  Her BNP is elevated and we are placing  the patient on IV Lasix.  This may be stretched over to p.o. in the next day  or two.   If the 2-D echocardiogram is significantly abnormal, then a patient  consultation with cardiology may be considered.  If not, I think the patient  would benefit from outpatient cardiac evaluation for possibly undergoing a  stress test.   ANEMIA:  The patient is found to be significantly anemic during this  admission.  An iron profile study was checked, which showed a possible mixed  causes for  her anemia.  Her iron saturation was 16, percent saturation 10;  however her ferritin was 428.  Ferritin could be elevated because of her  inflammatory process.  Hence, I went ahead and started the patient on  ferrous sulfate.  Her hemoglobins have been stable (hemoglobin running  around the range of 8-9).   These pretty much summarize the patient's medical problems and her hospital  course up to this point.   MEDICATIONS:  1.  Cymbalta 30 mg p.o. daily.  2.  Ferrous sulfate 325 mg p.o. b.i.d.  3.  Lantus insulin 10 units b.i.d.  4.  Avapro 75 mg p.o. daily.  5.  Actos 30 mg p.o. daily.  6.  Antibiotics to be decided  7.  Lasix to be decided   The only medication that has not fully been decided on yet is the  antibiotic.  It will need to be discussed with Dr. Katrinka Blazing as to what  antibiotics he would like for the patient to be discharged on.  Also, the  dose of Lasix will need to be decided upon final time of discharge.   FOLLOWUP:  The patient is currently unassigned; will need to be assigned to  a physician.  She, of course, should follow up with Dr. Katrinka Blazing for her  cellulitis.   Depending on  the echocardiogram, she may need to follow up with cardiology  as well.   OTHER INSTRUCTIONS:  These will be dictated on final discharge.   Pending Studies at this time:  Chest X-Ray  2D-ECHO      Osvaldo Shipper, MD  Electronically Signed     GK/MEDQ  D:  09/07/2005  T:  09/07/2005  Job:  [id number]   cc:   Calvert Cantor, M.D.

## 2011-02-10 NOTE — Consult Note (Signed)
[name], TKACH                 ACCOUNT NO.:  [id number]   MEDICAL RECORD NO.:  [id number]          PATIENT TYPE:  INP   LOCATION:  A209                          FACILITY:  [facility]   PHYSICIAN:  Dennie Maizes, M.D.   DATE OF BIRTH:  1946-05-30   DATE OF CONSULTATION:  DATE OF DISCHARGE:                                   CONSULTATION   REASON FOR CONSULTATION:  Gross hematuria.   CONSULTATION REPORT:  This is a 65 year old female who has poorly-controlled  diabetes mellitus.  She was admitted to the hospital with hyperglycemia,  poorly-controlled diabetes mellitus, nausea and vomiting due to diabetic  gastroparesis, dehydration, acute renal failure.  She was started on  Lovenox.  She started having intermittent gross hematuria.  She has been on  Foley catheter drainage at present.   The patient did not have any hematuria or urolithiasis in the past.  She has  urinary frequency x3-4 and nocturia x4.  She had a urinary tract infection  in April 2007.   PAST MEDICAL HISTORY:  Diabetes mellitus for the past 20 years,  hyperlipidemia, anemia, hypertension, coronary artery disease, diabetic  neuropathy, history of recurrent urinary tract infections , status post  hysterectomy, history of right foot surgery, gastroparesis due to diabetes.   MEDICATIONS:  1. Lantus insulin 30 units subcutaneous q.h.s.  2. Plavix 75 mg one p.o. daily.  3. Humalog 4 units before meals.  4. Iron supplement.  5. Metoprolol 50 mg p.o. b.i.d.  6. Actos 30 mg p.o. daily.  7. Micardis hydrochlorothiazide 40/12.5 one p.o. daily.  8. Ranitidine 150 mg p.o. b.i.d.  9. Pravastatin 80 mg one p.o. q.h.s.  10.Isosorbide dinitrate 10 mg p.o. t.i.d.  11.Hydralazine 200 mg p.o. t.i.d.  12.Aspirin 81 mg one p.o. daily.  13.Cipro 250 mg p.o. daily.  14.Cymbalta 30 mg one p.o. daily.   ALLERGIES:  CODEINE.   PHYSICAL EXAMINATION:  ABDOMEN:  Soft.  No palpable flank mass or CVA  tenderness.  Bladder not palpable.   No suprapubic tenderness.  GENITOURINARY:  Foley catheter is draining blood-stained urine.   LABORATORY:  CBC:  WBCs 12.8, hemoglobin 12.2, hematocrit 35.6.  BUN 30,  creatinine 2.4.  Urinalysis:  Blood large, nitrites positive, leukocyte  esterase moderate, WBC total 11-20 per high-power field, RBCs too numerous  to count, bacteria few.   IMPRESSION:  1. Hematuria.  2. Urinary tract infection.  3. Chronic renal failure.   PLAN:  1. Urine culture and sensitivity.  2. Cipro p.o.  3. CT scan of abdomen and pelvis without contrast or further evaluation.  4. Cystoscopy will be done later.   Thanks for this consult.      Dennie Maizes, M.D.  Electronically Signed     SK/MEDQ  D:  07/21/2006  T:  07/21/2006  Job:  [id number]

## 2011-02-10 NOTE — Group Therapy Note (Signed)
[name], [name]                 ACCOUNT NO.:  [id number]   MEDICAL RECORD NO.:  [id number]          PATIENT TYPE:  INP   LOCATION:  A330                          FACILITY:  [facility]   PHYSICIAN:  Margaretmary Dys, M.D.DATE OF BIRTH:  Jan 27, 1946   DATE OF PROCEDURE:  05/11/2006  DATE OF DISCHARGE:                                   PROGRESS NOTE   SUBJECTIVE:  The patient is doing better today, no nausea or vomiting.  Appetite is improved.  She did have a gastric emptying study which was  abnormal.   OBJECTIVE:  GENERAL:  Conscious, alert, comfortable, not in acute distress.  VITAL SIGNS:  Blood pressure 140/75, pulse 67, respirations 20, T-max 98.5,  blood sugars have ranged between 142 to 358.  HEENT:  Normocephalic atraumatic.  Oral mucosa was moist with no exudate.  NECK:  Supple.  No JVD.  No lymphadenopathy.  LUNGS:  Clear clinically with good air entry bilaterally.  HEART:  S1 S2 regular.  No gallops or rubs.  ABDOMEN:  Soft, nontender.  Bowel sounds positive.  EXTREMITIES:  No edema.   LABORATORY DATA:  A nuclear medicine gastric emptying study done which shows  delayed gastric emptying after 2 hours.   ASSESSMENT/PLAN:  1. Diabetic gastroparesis, confirmed by the abnormal gastric emptying      study.  We will start her empirically on Reglan 10 mg intravenous      q.a.c. q.h.s. and transition her to oral once she shows significant      improvement.  I have discussed this with her.  She will need dietary      modifications too.  2. Poorly controlled diabetes.  Lantus dose has been adjusted with slight      improvement in blood sugars over the last 12-18 hours.  3. Chronic renal insufficiency, stable.  BUN and creatinine at baseline.  4. Coronary artery disease.  No chest pain.  5. Urinary tract infection on Cipro.  No fever.  No leukocytosis.   DISPOSITION:  The patient is doing well.  She has an abnormal gastri  emptying study suggestive of diabetic gastroparesis.  The  patient is started  on Reglan.  We will see if that improves intestinal motility.  If it does, I  think she might be ready to go home in the next 1-2 days.     Margaretmary Dys, M.D.  Electronically Signed    AM/MEDQ  D:  05/11/2006  T:  05/11/2006  Job:  [id number]

## 2011-02-10 NOTE — Op Note (Signed)
NAMEHERMA, [name]                 ACCOUNT NO.:  [id number]   MEDICAL RECORD NO.:  [id number]          PATIENT TYPE:  INP   LOCATION:  A304                          FACILITY:  [facility]   PHYSICIAN:  Dennie Maizes, M.D.   DATE OF BIRTH:  02/04/1946   DATE OF PROCEDURE:  07/23/2006  DATE OF DISCHARGE:  07/23/2006                                 OPERATIVE REPORT   PREOPERATIVE DIAGNOSIS:  Hematuria, rule out bladder lesions.   POSTOPERATIVE DIAGNOSIS:  Hematuria, rule out bladder lesions, normal  bladder.   OPERATIVE PROCEDURE:  Cystoscopy.   ANESTHESIA:  Local 2% Xylocaine jelly.   SURGEON:  Dennie Maizes, M.D.   COMPLICATIONS:  None.   INDICATIONS FOR PROCEDURE:  This 65 year old female was admitted to hospital  with multiple medical problems.  She had intermittent gross hematuria.  CT  scan revealed two small right renal calculi.  No other abnormality was  noted.  She was taken to the operating room today for cystoscopy to rule out  bladder lesions.   DESCRIPTION OF PROCEDURE:  The patient was placed on the cystoscopy table in  the dorsal lithotomy position.  The lower abdomen and genitalia were prepped  and draped after removal of Foley catheter.  A cotton swab was soaked in 2%  Xylocaine jelly and kept in the urethra for 5 minutes.  Cystoscopy was done  with a 17-French scope.  There were mild trabeculations of bladder mucosa.  The trigone, ureteral orifices and bladder mucosa were otherwise normal.  There was no evidence of any inflammation foreign body tumor in the bladder.  The cystoscope was then removed was then removed.  The patient was  transferred to the PACU in a satisfactory condition.      Dennie Maizes, M.D.  Electronically Signed     SK/MEDQ  D:  07/23/2006  T:  07/24/2006  Job:  [id number]

## 2011-02-10 NOTE — Op Note (Signed)
NAMETRISTEN, [name]                 ACCOUNT NO.:  [id number]   MEDICAL RECORD NO.:  [id number]          PATIENT TYPE:  INP   LOCATION:  A302                          FACILITY:  [facility]   PHYSICIAN:  Jerolyn Shin C. Katrinka Blazing, M.D.   DATE OF BIRTH:  11/19/1945   DATE OF PROCEDURE:  01/12/2006  DATE OF DISCHARGE:                                 OPERATIVE REPORT   PREOPERATIVE DIAGNOSIS:  Subfascial abscess plantar surface, right foot.   POSTOPERATIVE DIAGNOSIS:  Subfascial abscess with extension to deep  compartment of plantar aspect of right foot.   PROCEDURE:  Wide excision and drainage of deep subfascial abscess plantar  aspect right foot, complex.   SURGEON:  Dirk Dress. Katrinka Blazing, M.D.   DESCRIPTION:  Under spinal anesthesia, the right foot and leg were prepped  and draped in a sterile field.  Initially an incision was made around the  puncture site in the base of the foot.  This was extended to the medial  aspect at the level of the arthroscope.  A large volume of purulent drainage  was obtained.  Wound cultures for aerobic and anaerobic pathogens was taken.  The incision was being extended down to the deep plantar fascia.  It was  widened because of the extension of necrosis in most of the subcutaneous  tissue.  There was purulence draining beneath the plantar fascia.  Irrigation and debridement of this was carried out.  Three counter incisions  were made medially below the malleolus, anteriorly at the base of the  metatarsal head and posteriorly near the base of the calcaneus.  Through  these counter-incisions, 1/4-inch Penrose drains were placed.  These will be  used to try to assist with better drainage of the infected abscess pocket.  These were secured with #3-0 nylon.  Further irrigation was carried out.  More debridement of necrotic debris was carried out.  Attention was then  turned to the webspace of the first and second toe.  Inspection revealed  this to be healing fairly nicely;  however, there was instability of the  interphalangeal joint.  A mosquito clamp was placed and large fragments of  bone were removed, confirming the fact that she indeed had osteomyelitis of  the distal phalanx.  This will lead to a very unstable joint; however, the  tissue surrounding the toe was actually viable.  The toe was further  inspected and all necrotic debris was removed.  It was elected not to drain  this but to leave it open.  Dressings were placed.  The patient was  transferred to a bed and taken to the Post Anesthetic Care Unit for further  monitoring.   ADDITIONAL NOTE:  With the extent of infection in the foot, there is a high  probability that the patient is going to have to have below-the-knee  amputation.  However, try to salvage the foot by vigorous antibiotics for at  least 8 weeks.  IV and local wound, vigorous local wound, care on a daily  basis.  Even with this, the potential for healing without further operative  treatment seems  to be very poor.      Dirk Dress. Katrinka Blazing, M.D.  Electronically Signed     LCS/MEDQ  D:  01/12/2006  T:  01/14/2006  Job:  [id number]   cc:   Magnus Sinning. Rice, M.D.  Fax: [phone]

## 2011-02-10 NOTE — Group Therapy Note (Signed)
[name], [name]                 ACCOUNT NO.:  [id number]   MEDICAL RECORD NO.:  [id number]          PATIENT TYPE:  INP   LOCATION:  A209                          FACILITY:  [facility]   PHYSICIAN:  Margaretmary Dys, M.D.DATE OF BIRTH:  Sep 20, 1946   DATE OF PROCEDURE:  07/21/2006  DATE OF DISCHARGE:                                   PROGRESS NOTE   SUBJECTIVE:  The patient feels much better today. I feel she is back to her  baseline. Feels fairly strong. She has had no more nausea and vomiting. She  denies any abdominal pain. Appetite has also improved. Her blood sugars are  also tremendously improved from the 700 number she was admitted with. Her  ranges are currently in the 168 to 280.   OBJECTIVE:  GENERAL:  She is a lot comfortable, not in acute distress.  VITAL SIGNS:  Stable. Blood sugars again in the 107 to 286.  HEENT:  Normocephalic, atraumatic. Oral mucosa was moist. No exudates.  NECK:  Supple. No JVD, no lymphadenopathy.  LUNGS:  Clear with good air entry bilaterally.  HEART:  S1, S2, regular. No S3, gallops or rubs.  ABDOMEN:  Soft, nontender. Bowel sounds positive. No masses palpable.  EXTREMITIES:  No edema.  GENITOURINARY:  The patient has a Foley catheter in which continues to drain  blood, although most of the blood appeared to be dark with some clots seen  in the catheter.   LABORATORY DATA/DIAGNOSTIC DATA:  White blood cell count 8, hemoglobin 11.1,  hematocrit 32.4 which is a slight drop from preadmission numbers, platelet  count 243 with no left shift. Sodium 136, potassium 3.9, chloride 106, CO2  25, glucose 158, BUN 28, creatinine 2.2. Calcium 8.3.   ASSESSMENT AND PLAN:  1. Severe hyperglycemia. This has improved .The patient is currently on      lantus insulin 15 units subcu once a day. I will increase this to 30      units subcu once a day which was what she was on at home. Despite that      her hemoglobin A1c was noted to have checked in at 11.3. She  will need      to get more aggressive treatment of her diabetes. We may try to      increase dose of Lantus, but I do suspect that there may also be other      concerns in terms of dietary compliance.  2. Hematuria. The etiology is unclear. The patient's H&H has slightly      dropped from the preadmission numbers. The patient is currently having      hematura from her foley catheter. I did not see any bright red blood      though. I will request urology to see her. We will hold her Plavix at      this time. I agree to continue to hold her Lovenox. We will put her on      sequential compression device for DVT prophylaxis.   DISPOSITION:  I believe the patient may be able to go home in the  morning if  she continues to do fairly well and the hematuria resolves. We will continue  all other medications.      Margaretmary Dys, M.D.  Electronically Signed     AM/MEDQ  D:  07/21/2006  T:  07/21/2006  Job:  [id number]

## 2011-02-10 NOTE — Group Therapy Note (Signed)
[name], [name]                 ACCOUNT NO.:  [id number]   MEDICAL RECORD NO.:  [id number]          PATIENT TYPE:  INP   LOCATION:  A306                          FACILITY:  [facility]   PHYSICIAN:  Margaretmary Dys, M.D.DATE OF BIRTH:  [birth year]   DATE OF PROCEDURE:  10/07/2005  DATE OF DISCHARGE:                                   PROGRESS NOTE   SUBJECTIVE:  The patient feels better today. She has had very minimal bowel  movements today. She denies any nausea or vomiting. No fever or chills. The  patient was seen by GI and it was recommended that we continue the Flagyl  and will likely have outpatient colonoscopy.   OBJECTIVE:  Conscious, alert, comfortable. In no acute distress. A very  pleasant lady.  VITAL SIGNS: Blood pressure was 125/72, pulse of 98, respirations 20, T-max  was 98.6. Blood sugars have ranged between 227 to 308.  LUNGS:  Clear.  HEART:  S1, S2 regular, no S3, S4, gallops or rubs.  ABDOMEN:  Soft, nontender, bowel sounds are positive. No masses palpable.  EXTREMITIES:  No edema.   LABORATORY DATA:  White blood count was 14,000, hemoglobin 9.9, hematocrit  28.4, platelet count 332,000 with neutrophils 83%. Sodium was 135, potassium  4.2, chloride 102, CO2 25, glucose 225, BUN of 7, creatinine 1.0, calcium  7.7.Stools negative for Clostridium difficile.   ASSESSMENT AND PLAN:  1.Diarrhea of unclear etiology. The patient is  currently on empiric therapy for C. difficile.  I will decrease Flagyl dose to 250 mg p.o. t.i.d. We appreciate continued GI  input, she will likely need a colonoscopy as an outpatient. The patient is  really improved.  1.  Dehydration. Will continue hydration.  2.  Anemia, likely iron-deficiency anemia, anemia of chronic disease. H&H is      quite borderline. Will not transfuse at this time especially if she has      no chest pain.  3.  Coronary disease. The patient is stable. The patient is felt not to be a      surgical candidate.  5.The right foot looks clean and dry, continue with current dressing. She is  currently on Unasyn. Will probably put on Augmentin at discharge.   Correct electrolytes.      Margaretmary Dys, M.D.  Electronically Signed    AM/MEDQ  D:  10/07/2005  T:  10/07/2005  Job:  [id number]

## 2011-02-10 NOTE — Discharge Summary (Signed)
[name], [name]                 ACCOUNT NO.:  [id number]   MEDICAL RECORD NO.:  [id number]          PATIENT TYPE:  INP   LOCATION:  A330                          FACILITY:  [facility]   PHYSICIAN:  Lonia Blood, M.D.      DATE OF BIRTH:  10-Mar-1946   DATE OF ADMISSION:  05/08/2006  DATE OF DISCHARGE:  08/23/2007LH                                 DISCHARGE SUMMARY   PRIMARY CARE PHYSICIAN:  Dr. Jacqulyn Bath.   DISCHARGE DIAGNOSES:  1. Diabetic gastroparesis, currently on Reglan.  2. Urinary tract infection.  3. Diabetes type 2.  4. Normocytic anemia.  5. Hypertension.  6. Chronic renal insufficiency.  7. Coronary artery disease.   DISCHARGE MEDICATIONS:  1. Reglan 10 mg p.o. t.i.d.  2. Ciprofloxacin 500 mg p.o. b.i.d. for 5 days.  3. Cymbalta 30 mg daily.  4. Diflucan 200 mg daily for 5 days.  5. Aspirin 81 mg daily.  6. Hydralazine 25 mg t.i.d.  7. Isosorbide dinitrate 10 mg p.o. t.i.d.  8. Pravastatin 80 mg q.h.s.  9. Ranitidine 150 mg p.o. b.i.d.  10.Micardis HCT 40/12.5, one daily.  11.Actos 30 mg daily.  12.Metoprolol 50 mg b.i.d.  13.Iron 325 mg daily.  14.Plavix 75 mg daily.  15.Humalog insulin, sliding scale, as needed.  16.Lantus 30 units subcutaneously.  17.Patient also takes Prevacid 30 mg as needed.   DISPOSITION:  The patient is currently stable.  She wants to go home and we  will discharge her to followup with her primary care physician Dr. Jacqulyn Bath on  Monday.  She has had a few episodes of hypoglycemia and we will decrease her  insulin dose and she will follow up with Dr. Jacqulyn Bath as indicated.   PROCEDURE:  Procedures performed this admission include:  1. Chest x-ray May 08, 2006 showing tubular opacity left upper lobe      which probably was mucous plugging.  Pulmonary nodule is not excluded.      Followup chest x-ray in about six weeks recommended.  2. Gastric emptying study on May 10, 2006 showing delayed gastric      emptying consistent with gastroparesis.   CONSULTATIONS:  None.   BRIEF HISTORY AND PHYSICAL:  Please refer to dictated history and physical  by Dr. Carylon Perches.  It showed, however, patient is a 65 year old white female  that was admitted secondary to low sodium and high sugar levels.  She was a  patient of Dr. Domenica Reamer apparently and was sent over here for evaluation of  low sodium and high sugars.  Her blood sugar was apparently in the 600 range  when she arrived.  She has also been light-headed and experiencing a lot of  other problems.  She is a longstanding diabetic.  On arrival in the  emergency department she was found to be hypotensive with blood pressure  90/57.  Her saturation's were fine at 98%.  She has an ulcer on the sole of  her right foot, otherwise no specific findings on physical examination.  The  patient was also having continuous nausea and vomiting.  She was  subsequently admitted for further management.   LABORATORY DATA:  He labs showed a white count of 8.3, normal hemoglobin  13.7.  Her sodium was 126 on admission with potassium 4.5.  Creatinine was  3.5.  BUN was 56.   HOSPITAL COURSE:  PROBLEM #1:  HYPONATREMIA:  This was most likely secondary  to dehydration.  With hydration and continued monitoring, the patient's  sodium has returned back to virtually normal.  Currently it is 134.  This  was proven by the fact that her creatinine has also improved.  It shows that  she must have been dehydrated from continuous nausea and vomiting.  Currently she is stable with no nausea, vomiting and her electrolytes seem  to be stable.   PROBLEM #2:  GASTROPARESIS:  The patient's continuous nausea and vomiting  was worked up and appears to be secondary to diabetic gastroparesis as  indicated by the gastric emptying studies discussed above.  She is currently  on Reglan and that seems to have helped her.  She has not had any nausea or  vomiting in about two to three days now and she is able to eat and drink  without a  problem.   PROBLEM #3:  DIABETES:  This was poorly controlled.  The patient may not  have been compliant with her medications at home.  Her hemoglobin A1C was  elevated as well indicating poor control.  We have placed her on an insulin  regimen and she seems to be high in the middle of the night.  She will  continue on Lantus and sliding scale.  She will need to have close followup  as an outpatient.   PROBLEM #4:  HYPERTENSION:  Her blood pressure was also well controlled this  admission.   PROBLEM #5:  ACUTE ON CHRONIC RENAL FAILURE:  This was prerenal and with  hydration the patient has improved tremendously.   PROBLEM #6:  URINARY TRACT INFECTION:  The patient started having fever with  leukocytosis around May 14, 2006.  Urinalysis showed findings consistent  with a urinary tract infection.  Further, she was initiated on ciprofloxacin  and urine culture was ordered which is still pending.  We will therefore  continue to treat her empirically.  Since starting the antibiotic her  temperature has improved tremendously and she has not been febrile.   Other medical problems at this point seem to be very much stable and the  patient will resume her home medications and followup with her primary care  physician.      Lonia Blood, M.D.  Electronically Signed     LG/MEDQ  D:  05/17/2006  T:  05/17/2006  Job:  [id number]   cc:   Dr. Jacqulyn Bath, PCP

## 2011-02-10 NOTE — H&P (Signed)
[name], [name] NO.:  [id number]   MEDICAL RECORD NO.:  [id number]          PATIENT TYPE:  EMS   LOCATION:  ED                            FACILITY:  [facility]   PHYSICIAN:  Kingsley Callander. Ouida Sills, MD       DATE OF BIRTH:  1946/03/18   DATE OF ADMISSION:  05/08/2006  DATE OF DISCHARGE:  LH                                HISTORY & PHYSICAL   CHIEF COMPLAINT:  Low sodium and high sugar levels.   HISTORY OF PRESENT ILLNESS:  This patient is a 65 year old white female  patient of Dr. Vernon Prey in [place] who presented to the [facility]  ER after she was advised to report for evaluation of a low sodium level and  a high blood sugar level.  Blood sugar had been in the 600 range.  The  patient has been lightheaded for the past five days.  She has experienced  repeated bouts of vomiting and has felt especially sick with vomiting and  weakness each morning.  On arrival, she was found to have a blood sugar of  510.  She was treated with 15 units of IV insulin prior to my arrival.  A  repeat glucose had been 325.  She has not revealed signs of diabetic  ketoacidosis.  She does not have abdominal pain.  She last ate earlier  today.  Her diabetes regimen has recently been modified to Lantus 30 units  daily with Humalog 5 units before meals.  She also takes Actos 30 mg daily.  She has not had recent problems with hypoglycemia.   PAST MEDICAL HISTORY:  1. Diabetes for 20 years.  2. Hyperlipidemia.  3. Anemia receiving erythropoietin injections.  4. Hypertension.  5. Coronary artery disease.  6. Diabetic neuropathy.  7. Recent urinary tract infection on Cipro.  8. Hysterectomy.  9. Right foot surgery.  10.Right foot ulcer.  11.Hysterectomy.   MEDICATIONS:  1. Lantus 30 units q.h.s.  2. Humalog 5 units before meals.  3. Plavix 75 mg daily.  4. Iron daily.  5. Metoprolol 50 mg b.i.d.  6. Actos 30 mg daily.  7. Micardis/HCTZ 40/12.5 mg daily.  8. Ranitidine 150 mg  b.i.d.  9. Pravastatin 80 mg q.h.s.  10.Isosorbide dinitrate 10 mg t.i.d.  11.Hydralazine 25 mg t.i.d.  12.Aspirin 81 mg daily.  13.Cipro 250 mg daily.  14.Cymbalta 30 mg daily.   ALLERGIES:  CODEINE.   SOCIAL HISTORY:  She does not smoke, drink or use recreational drugs.  She  had formerly worked as a Lawyer at [employer].   FAMILY HISTORY:  Her mother died of lung cancer.  Her father died of liver  cancer.   REVIEW OF SYSTEMS:  She denies hematemesis, diarrhea, abdominal pain or  fever.   PHYSICAL EXAM:  VITAL SIGNS:  Temperature 96.7, pulse 66, blood pressure  114/56 after initially being 90/57, respirations 18, oxygen saturation 98%.  GENERAL:  Alert and in no distress.  HEENT:  No scleral icterus.  The pharynx is edentulous.  She has an upper  plate.  NECK:  No JVD, thyromegaly or carotid bruits.  LUNGS:  Clear.  HEART:  Regular with a grade 1 systolic murmur.  ABDOMEN:  Soft, nontender.  No hepatosplenomegaly.  EXTREMITIES:  No cyanosis, clubbing or edema.  She has an ulcer on the sole  of the right foot which is approximately 1.5 x 1.5 cm.  She has a well-  healed surgical wound on the dorsum of the distal right foot.  NEURO:  Grossly intact.  LYMPH NODES:  No enlargement.   LABORATORY DATA:  White count 8.3, hemoglobin 13.7, platelets 348,000.  Sodium 126, potassium 4.5, chloride 84, bicarb 31, BUN 56, creatinine 3.5,  calcium 9.2, glucose 510.  Her ABG revealed a pH 77.43, with a pCO2 of 44  and pO2 of 88.   IMPRESSION:  1. Poorly controlled diabetes:  She will be treated with additional doses      of NovoLog plus Lantus tonight.  2. Recurrent nausea and vomiting:  She may have underlying diabetic      gastroparesis.  Will switch ranitidine to IV Protonix for now.  She      describes having had a previous EGD.  She may have underlying      gastroparesis and may benefit from Reglan.  3. Chronic kidney disease:  Continue Micardis.  4. Hypertension:  Continue  Micardis and metoprolol.  5. Coronary heart disease:  Continue current medications.  6. Recent urinary tract infection:  Continue Cipro.  7. Diabetic neuropathy:  Continue Cymbalta.  8. Right foot ulcer:  We will continue dressing changes and have the wound      care nurse consult on her case.  9. History of anemia now well treated.  We will hold her iron and      erythropoietin for now.      Kingsley Callander. Ouida Sills, MD  Electronically Signed     ROF/MEDQ  D:  05/08/2006  T:  05/08/2006  Job:  [id number]   cc:   Ernestina Penna, M.D.  Fax: [phone]

## 2011-02-10 NOTE — Discharge Summary (Signed)
[name], [name]                 ACCOUNT NO.:  [id number]   MEDICAL RECORD NO.:  [id number]           PATIENT TYPE:  INP   LOCATION:  A313                          FACILITY:  [facility]   PHYSICIAN:  Osvaldo Shipper, MD     DATE OF BIRTH:  [birth year]   DATE OF ADMISSION:  04/05/2006  DATE OF DISCHARGE:  07/25/2007LH                                 DISCHARGE SUMMARY   PRIMARY CARE PHYSICIAN:  Lindaann Pascal, MD from [facility]   HISTORY:  Please review H&P dictated by myself regarding present illness.   DISCHARGE DIAGNOSES:  1. Renal failure, likely acute, not improving, requiring followup.  2. Anemia, regular, likely of chronic disease.  3. Nonketotic hyperosmolar state, resolved.  4. Uncontrolled diabetes.  5. History of right foot osteomyelitis.  6. History of coronary artery disease with mild ischemic cardiomyopathy.  7. Candidemia secondary to Port-A-Cath, resolved.   BRIEF HOSPITAL COURSE:  Problem #1:  NONKETOTIC HYPEROSMOLAR STATE.  Briefly, this is a 65 year old Caucasian female who has also been known to  our hospitalist service for multiple admissions who presented with feeling  weak and tired and was found to have abnormal blood work at her PMDs office.  She was found to have a blood sugar of over 800.  The patient was not  ketotic nor acidotic.  The patient was put on the Glucomander and on the  insulin drip and was managed with the IV fluids as well.  The patient's  blood sugars were soon better controlled.  She was switched over to her  Lantus and was also started back on her Actos and discharged.  In between  she had periods of hyperglycemia which required down titration of her  Lantus.   Problem #2:  SEVERE ANEMIA.  The patient was found to have a hemoglobin of  7.4 on admission.  We transfused her about 3 units of blood because of the  history of coronary artery disease.  The patient has a history of recurrent  episodes of anemia for which no  etiology has been found.  She underwent a GI  evaluation on the last admission which included an EGD and colonoscopy both  of which were unremarkable.  However, the patient has never had any blood in  her stool at any point.  Because of the recurrence of anemia hematology  service was consulted who agreed that this was anemia of chronic disease.  They recommended that the patient be transfused as needed.  She was also  given Aranesp 1 dose was given on July 13, and the next full dose will be  given on July 27.  She will need to followup with Dr. Mariel Sleet to consider  possible bone marrow biopsy.   Problem #3:  RENAL FAILURE.  The patient was found to have a creatinine of  4.8.  Her baseline creatinine function was basically normal with a baseline  creatinine of about 1.2.  It was not clear what the etiology of the renal  failure is.  We treated this initially as a prerenal azotemia and gave  her  IV fluids.  However, with this her creatinine did not improve much.  She was  also given some IV Lasix by Dr. Kristian Covey who was following her; however,  again it did not show much improvement in her renal function.  The patient  at no point was anuric because she was making an exceptional amount of  urine.  Her potassium was also staying within the normal range with some  supplementation.  Dr. Kristian Covey also felt that this renal failure could be  secondary to antibiotics that she was on including vancomycin and  doxycycline.  However, in summary we do not know the clear etiology of the  acute worsening of her kidney function.  The patient will need to followup  with Dr. Kristian Covey in his office in the next couple of weeks.  Repeat blood  work will be obtained prior to that appointment.  At that point, further  decisions may be taken.  The patient may need to go on dialysis in the near  future.   Problem #4:  ABNORMAL PROTEIN ELECTROPHORESIS.  The patient underwent serum  protein as well as urine  protein electrophoresis and it showed polyclonal  element of protein in the urine and blood.  Because these studies could not  be interpreted appropriately, we sought help from oncologists, Dr. Nicola Police  consulted on this patient and recommended a few other studies, some of which  are pending at this time.  ANA was negative.  Complement studies were  ordered as well.  Bone scan was ordered which did not show any evidence for  a multiple myeloma.  Again, the patient need to have a bone marrow to  further elaborate on this condition.  She was asked to followup with Dr.  Mariel Sleet for the same.   Problem #5:  CARDIAC DISEASE.  The patient has coronary artery disease with  mild ischemic cardiomyopathy.  She underwent a cardiac catheterization in  December which showed severe triple vessel disease; however, the patient was  not thought to be a candidate for any operative intervention at this time;  and she was just managed medically.  She was put on aspirin and Plavix, beta  blockers and an ACE inhibitor as well as an ARB.  We obviously held  initially all of her antihypertensive agents and ARB and ACE inhibitors. I  do not think that she can go back to be on any of these medications because  of her renal insufficiency.  I started her on alternative treatment with  hydralazine and isosorbide dinitrate.  She was restarted on her metoprolol  and was restarted on aspirin and Plavix at this time.  She will need to  followup with her cardiology as an outpatient.   Problem #6:  CANDIDEMIA.  During the course of this admission the patient  was also found to be febrile.  Blood cultures grew Candida.  The patient had  a Port-A-Cath for her IV antibiotics which was promptly taken out by Dr.  Malvin Johns.  She was given Diflucan which will be considered for about 7 more  days.   Problem #7:  RIGHT FOOT OSTEOMYELITIS.  Chronic issue for which she was following with Dr. Elpidio Anis.  Dr. Katrinka Blazing has closed  his practice here.  She has received more than 2-1/2 to 2 months with IV antibiotics with  vancomycin and doxycycline.  I have asked her to followup her PMD to see if  they want to continue her on any antibiotics, but we  have discontinued IV  antibiotics for this patient at this time.  We have also told her to talk to  her PMD about getting referred to another surgeon for this osteomyelitis.   On the day of discharge the patient was feeling quite well.  I discussed  with her all the followup plans, discussed all recommendations made by  various specialists.  She and her husband seem to understand her complicated  medical problems.   DISCHARGE MEDICATIONS:  The patient has been told to discontinue all of her  home medications except those below:  1. Pravachol 80 mg q.p.m.  2. Metoprolol 50 mg b.i.d.  3. Actos 30 mg daily.  4. Zantac 150 mg b.i.d.  5. Plavix 75 mg daily.  6. Aspirin 81 mg daily.  7. Cymbalta 30 mg daily.  8. Diflucan 200 mg daily for 7 days.  9. Lantus 15 units b.i.d.  10.Isosorbide dinitrate 10 mg t.i.d.  11.Hydralazine 25 mg p.o. t.i.d.   FOLLOWUP:  1. With PMD in 1-2 weeks.  2. With Dr. Kristian Covey in about 2 weeks.  3. With Dr. Mariel Sleet within a week.   OTHER SPECIAL MEDICATIONS:  1. Aranesp 100 mcg that will be given via home health on July 27; she      received her first dose on July 13.  She will need to followup with Dr.      Mariel Sleet for further clarification on this.   CONSULTATIONS:  Obtained from Dr. Mariel Sleet, Dr. Kristian Covey, Dr. Nicola Police.   OTHER STUDIES DONE:  Include echocardiogram which showed improved in her  left ventricular EF.  Mild-to-moderate left ventricular hypertrophy was  noted.  Hyperdynamic regional and global function was noted.   DIET:  She was asked to continue her modified carbohydrate diet, as before.  Wound care to her right foot as before.      Osvaldo Shipper, MD  Electronically Signed     GK/MEDQ  D:  04/18/2006  T:   04/18/2006  Job:  [id number]   cc:   Ladona Horns. Mariel Sleet, MD  Fax: [phone]   Jorja Loa, M.D.  Fax: [phone]

## 2011-02-10 NOTE — Group Therapy Note (Signed)
NAMECARYLE, [name]                 ACCOUNT NO.:  [id number]   MEDICAL RECORD NO.:  [id number]          PATIENT TYPE:  INP   LOCATION:  A304                          FACILITY:  [facility]   PHYSICIAN:  Margaretmary Dys, M.D.DATE OF BIRTH:  01/13/1946   DATE OF PROCEDURE:  07/22/2006  DATE OF DISCHARGE:                                   PROGRESS NOTE   SUBJECTIVE:  The patient continues to feel well.  Her urine has cleared.  The patient was seen by urology yesterday who planned cystoscopy and a CT of  the abdomen and pelvis without contrast.  Her blood sugars have also  significantly improved.   OBJECTIVE:  Conscious, alert, comfortable, not in acute distress.  Pleasant  lady.  VITAL SIGNS:  Blood pressure was 176/78, pulse of 54, respiration of 20,  temperature was 97.9, blood sugars have ranged between 138/220.  Oxygen  saturation was 97% on room air.  HEENT EXAM:  Normocephalic and atraumatic.  Oral mucosa was moist with no  exudates.  Neck supple.  No JVD.  No lymphadenopathy.  LUNGS:  Clear clinically with good air entry bilaterally.  HEART:  S1-S2 regular.  No S3, S4, gallops or rubs.  ABDOMEN:  Soft, nontender.  Bowel sounds positive.  No masses palpable.  EXTREMITIES:  No pitting pedal edema.   LABORATORY/DIAGNOSTIC DATA:  H&H is stable at 12.2 and 35.6.  Platelet count  was 269.  Metabolic profile also stable with BUN of 25, creatinine of 2.0.  A CT scan of the abdomen and pelvis obtained yesterday showed some  peripelvic fluid, some anasarca and possible constipation.  She has some  right-sided renal stones with no hydronephrosis.  CT scan was  unremarkable.   ASSESSMENT/PLAN:  1. Severe hyperglycemia possibly due to poor compliance.  Hemoglobin A1c      is 11.  The patient's blood sugars have improved since being in the      hospital.  We increased her Lantus back to her home dose of 30 units      yesterday.  Continue on this for now and also  sliding scale.  We will  request diabetic educator to see her in the morning.  2. Hematuria:  Etiology is unclear but this has resolved.  Currently, her      urine bag is clear of bood.  Her hemoglobin and hematocrit is also      stable.  The patient was seen by urology.  A CT scan of the abdomen and      pelvis was really unremarkable.  Plan is to continue to hold the Plavix      and Lovenox for now and plan per urology.  He was planning to do a      cystoscopy if she was not better.   DISPOSITION:  The patient is doing fairly well.  If her urine continues to  be clear by tomorrow morning, I think on discussion with Dr. Dennie Maizes,  the patient may be discharged home.  Diabetic educator for improved  compliance to her diabetes is also planned for am.  Margaretmary Dys, M.D.  Electronically Signed     AM/MEDQ  D:  07/22/2006  T:  07/22/2006  Job:  [id number]

## 2011-02-10 NOTE — Consult Note (Signed)
[name], [name] NO.:  [id number]   MEDICAL RECORD NO.:  [id number]          PATIENT TYPE:  INP   LOCATION:  A214                          FACILITY:  [facility]   PHYSICIAN:  Vida Roller, M.D.   DATE OF BIRTH:  29-Sep-1945   DATE OF CONSULTATION:  09/08/2005  DATE OF DISCHARGE:                                   CONSULTATION   REFERRING PHYSICIAN:  Margaretmary Dys, M.D.   HISTORY OF PRESENT ILLNESS:  [name] is a 65 year old female admitted to  [facility] on August 30, 2005, with cellulitis and abscess of  right foot.  She underwent wide excision and debridement of her abscess on  September 01, 2005.  She was then treated with antibiotics intravenously.  On  September 05, 2005, the patient reported sudden onset of shortness of breath.  She states that she was coughing while lying in the bed.  This was followed  by 1-2 minutes of shortness of breath.  She denies any recurrence of  symptoms since that time.  She denies any chest discomfort.  She denies any  orthopnea, PND, peripheral edema or palpitations.   ALLERGIES:  CODEINE.   MEDICATIONS PRIOR TO ADMISSION:  1.  Levaquin 500 mg daily.  2.  Darvocet-N 100 p.r.n. started 3 days before admission.  3.  Lantus 10 units b.i.d.  4.  Actos 30 mg daily.  5.  Micardis, unknown dose.   CURRENT MEDICATIONS:  1.  Actos 30 mg daily.  2.  Avapro 75 mg daily.  3.  Cleocin 600 mg IV q.6h.  4.  Cymbalta 30 mg daily.  5.  Ferrous sulfate 325 mg b.i.d.  6.  Lantus 10 units b.i.d.  7.  Lasix 40 mg IV daily.  8.  Levaquin 750 mg IV daily.  9.  Lovenox 40 mg nightly.  10. Unasyn 3 g IV q.6h.   PAST MEDICAL HISTORY:  1.  Type 2 diabetes.  2.  Cerebrovascular disease followed by Dr. Madilyn Fireman.  The last time she was      seen was approximately 2-3 years ago.  3.  Hypertension.   SOCIAL HISTORY:  She is separated from her husband and lives in [place].  She has three children.  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  She quit smoking approximately 35 years ago and reports minimal  smoking prior to that.  She drinks socially with no elicit drug use.   FAMILY HISTORY:  Both mother and father deceased with no history of coronary  disease.  She has two sisters living with no known coronary disease.   REVIEW OF SYSTEMS:  CONSTITUTIONAL:  Negative for fevers and chills.  HEENT:  Negative for headaches, congestion, vertigo.  SKIN:  No rashes.  CARDIOPULMONARY:  Per HPI.  GU:  No frequency or dysuria.  NEUROLOGIC:  No  weakness.  Occasional numbness in her feet.  MUSCULOSKELETAL:  Arthralgias  in her hands.  No myalgias.  GASTROINTESTINAL:  Nausea since admission.  No  vomiting, diarrhea, bright red blood per rectum or melena.  No GERD  symptoms.  No abdominal discomfort.  No hematemesis.  All other systems  reviewed are negative except for HPI.   PHYSICAL EXAMINATION:  VITAL SIGNS:  Temperature 99.4, pulse 104,  respirations 16, blood pressure 138/81.  I's and O's negative with 1120 mL  per 24 hours.  GENERAL:  This is a well-developed, well-nourished female in no acute  distress.  HEENT:  Normocephalic, atraumatic.  Pupils equal round and reactive to  light.  Extraocular movements intact.  NECK:  Bilateral carotid bruits.  No jugular venous distention.  No  lymphadenopathy.  CARDIAC:  Regular rate and rhythm, normal S1, S2.  LUNGS:  Clear to auscultation with decreased breath sounds in bilateral  bases.  SKIN:  No rashes.  BREASTS:  Deferred.  ABDOMEN:  Soft, nontender with active bowel sounds.  GENITALIA:  Deferred.  EXTREMITIES:  Trace edema in bilateral lower extremities.  Right lower  extremity with dressing clean, dry and intact.  RECTAL:  Deferred.  MUSCULOSKELETAL:  No joint deformity.  NEUROLOGIC:  She is alert and oriented x3 and cranial nerves are grossly  intact.   LABORATORY DATA AND X-RAY FINDINGS:  Chest CT with CHF and diffuse body wall  with  moderate bilateral effusion and bilateral compressive atelectasis.  No  PE.  Chest x-ray with bilateral pleural effusion with possible underlying  right lower lobe pneumonia.  No definite changes of CHF.  Electrocardiogram  in sinus rhythm at 94 beats per minute.  Normal axis and nonspecific ST  abnormalities.  Normal intervals.   White blood cells 10.9, hemoglobin 8.4, hematocrit 24.7, platelet 569, MCV  87, MCHC 34.  Iron 16, ferritin 428.  Sodium 136, potassium 3.3, chloride  106, bicarb 28, BUN less than 1, creatinine 1.0, glucose 86.  Cardiac  enzymes with CK-MB negative x4, troponin 0.21 to 0.28.  Wound cultures grew  Group B Streptococcus.  Three blood cultures were negative x5 days.  BNP  1770.   IMPRESSION/RECOMMENDATIONS:  1.  Congestive heart failure with elevated B-type natriuretic peptide level      with pulmonary edema on chest computed tomography.  Normal left      ventricular function by echocardiogram, but no significant valvular      abnormalities.  Normal creatinine-kinase and myoglobins with mildly      elevated troponins are likely secondary to congestive heart failure and      lung process.  Cardiac risk factors include diabetes and hypertension.      She also has a known cerebrovascular disease and question of a flash      pulmonary edema that currently appears to be resolved.  The patient will      need further evaluation with cardiac evaluation.  Will have her      transferred to [facility] on Monday after she has undergone      further treatment for congestive heart failure and her antibiotic      therapy for her abscess.  Will likely perform cardiac catheterization on      Tuesday.  Will go ahead and start her on aspirin 81 mg daily and add      Lopressor 25 mg p.o. b.i.d.  2.  Anemia, unclear of how chronic her anemia is.  She is iron deficient by     the above labs.  Would recommend evaluation by gastrointestinal for      this.  3.  Right foot  abscess, status post debridement, currently treated with  intravenous antibiotics per the primary team.  4.  Hypokalemia.  Will require replacement.  5.  Hypertension.  Well-controlled currently on her Avapro and Lasix.  Will      add Lopressor as above.  6.  Diabetes.  Followup by primary team.   We appreciate this consult and will be happy to follow this patient along  with you.      Jae Dire, P.A. [facility]      Vida Roller, M.D.  Electronically Signed    AB/MEDQ  D:  09/08/2005  T:  09/08/2005  Job:  [id number]

## 2011-02-10 NOTE — H&P (Signed)
[name], [name]                 ACCOUNT NO.:  [id number]   MEDICAL RECORD NO.:  [id number]          PATIENT TYPE:  INP   LOCATION:  A209                          FACILITY:  [facility]   PHYSICIAN:  Margaretmary Dys, M.D.DATE OF BIRTH:  [birth year]   DATE OF ADMISSION:  07/18/2006  DATE OF DISCHARGE:  LH                                HISTORY & PHYSICAL   ADMITTING DIAGNOSES:  1. Severe hyperglycemia.  2. History of poorly controlled diabetes mellitus.  3. Nausea and vomiting with history of diabetic gastroparesis.  4. Dehydration with acute renal failure, likely prerenal azotemia.   PRIMARY CARE PHYSICIAN:  Dr. Lorin Picket Long   CHIEF COMPLAINT:  Nausea and vomiting and weakness of 1 day duration.   HISTORY OF PRESENT ILLNESS:  [name] is a 65 year old Caucasian female, who  is well known to the hospitalist service from prior admissions.  The patient  has had multiple admissions in the past 6 months for poorly controlled  diabetes, nausea, and vomiting.  She has had a gastric emptying study which  confirmed the presence of diabetic gastroparesis.  It is unclear if the  patient is also compliant to her medications and diet at home,  the husband  says that the patient has been fairly compliant, and she has been doing  okay.  The patient was recently started on ?Aranesp for anemia.  Her husband  is not sure of the name, but he thinks Aranesp sounds  familiar.   The patient reports feeling generally weak.  She denies any diarrhea.  She  has had no abdominal pain, denies retching.   Evaluation in the emergency room, the patient was noted to be dehydrated.  Her blood sugars were in the 700s.  She received some IV insulin, and she is  currently on Glucomander protocol.  The patient is now being admitted for  further management.   REVIEW OF SYSTEMS:  She denies any chest pain or shortness of breath.  No  fevers or chills.  The patient states she was doing fairly well, and her  blood sugar  was only 178 this morning before she fell ill.   PAST MEDICAL HISTORY:  1. Diabetes for the past 20 years with multiple admissions for poor      control.  2. Hyperlipidemia.  3. Anemia, receiving Arinesp erythropoietin injections.  4. Hypertension.  5. Coronary artery disease.  6. Diabetic neuropathy.  7. History of recurrent urinary tract infections.  8. Hysterectomy.  9. History of right foot surgery.  10.Diabetic gastroparesis diagnosed in a recent study.  11.Status post hysterectomy.  12.Status post right foot ulcer.   CURRENT MEDICATIONS:  1. She is on Lantus insulin 30 units subcu q.h.s.  2. Plavix 75 mg once daily.  3. Humalog 5 units before meals.  4. Iron daily.  5. Metoprolol 50 mg p.o. b.i.d.  6. Actos 30 mg p.o. daily.  7. Micardis/hydrochlorothiazide 40/12.5 mg p.o. daily.  8. Ranitidine 150 mg p.o. b.i.d.  9. Pravastatin 80 mg p.o. q.h.s.  10.Isosorbide dinitrate 10 mg p.o. t.i.d.  11.Hydralazine 25 mg p.o. t.i.d.  12.Aspirin 81 mg p.o. once daily.  13.Cipro 250 mg once daily.  14.Cymbalta 30 mg p.o. once daily.   ALLERGIES:  CODEINE.   SOCIAL HISTORY:  Denies any illicit drug use, does not smoke or drink  alcohol.  She previously worked as a Lawyer at [employer].   FAMILY HISTORY:  Mother died of lung cancer.  Father died of liver cancer.  The patient lives with husband, whom I have met several times.   PHYSICAL EXAMINATION:  GENERAL:  The patient is acutely-ill-looking and  lethargic.  VITAL SIGNS:  On arrival in the emergency room:  Blood pressure was 172/89,  pulse 97, respirations 20, temperature 98.1 degrees F, O2 saturation was 99%  on room air.  HEENT:  Normocephalic, atraumatic.  Oral mucosa was moist with no exudate.  NECK:  Supple.  No JVD, lymphadenopathy.  LUNGS:  Clear clinically with good air entry bilaterally.  HEART:  S1 and S2 regular.  No S3, S4, gallops or rubs.  ABDOMEN:  Soft, nontender, bowel sounds are positive.  No masses  palpable.  EXTREMITIES:  No edema.  CNS:  Grossly intact with no focal neurologic deficits.   LABORATORY/DIAGNOSTIC DATA:  White blood cell count 13,000, hemoglobin of  12, hematocrit 34.8, platelet count 251 with neutrophils of 88% and with a  left shift.   Sodium was 122, potassium 4.5, chloride 89, CO2 25.  Glucose is 704, BUN of  28.  Creatinine was 2.0.  Liver function test was normal.  Calcium was 9.2.  Lipase was normal at 45.  Urinalysis was positive for glucose.  No evidence  of urinary tract infection was noted.   Abdominal x-ray shows probable constipation with no evidence of bowel  obstruction or an acute process.  Chest x-ray shows possible mild left lower  lobe atelectasis and a left upper lobe density approximately 1 cm in size  which appears to be different from a prior study.   ASSESSMENT/PLAN:  [name] is a 65 year old Caucasian female with diabetes  mellitus, does have a lot of difficulty with control in the past.  The  patient presents again with nausea, vomiting, severe hyperglycemia.  The  patient does not have any laboratory evidence of any acidosis at this time.  She remains hemodynamically stable.  Plans are to admit her to the medical  floor.  We will continue on the Glucomander regimen at this time.   We will check her BMET every 2 hours and correct her potassium, as it is  expected to drop once on the insulin infusion.  We will keep her NPO except  for her medications, and we will review with her again with a nutritional  consult if the patient has been following directions regarding multiple  small meals for her diabetic gastroparesis.  We will also discuss compliance  issues in terms of diet and insulin.  Her husband is concerned that the  Arinesp is making her sick.  She gets a shot every couple of weeks and each  time she gets it, she is always ill.  We will also look into this if this is  the issue.  We will follow up with CT scan for her chest to  evaluate the nodule that was  seen.   I have explained the above plans to the patient, and she verbalized full  understanding.  Her code status is full code.  The patient was seen,  examined.      Margaretmary Dys, M.D.  Electronically Signed  AM/MEDQ  D:  07/19/2006  T:  07/19/2006  Job:  [id number]

## 2011-02-10 NOTE — Procedures (Signed)
NAMEJAMESIA, LINNEN                 ACCOUNT NO.:  [id number]   MEDICAL RECORD NO.:  [id number]          PATIENT TYPE:  INP   LOCATION:  A313                          FACILITY:  [facility]   PHYSICIAN:  [place] Bing, M.D. [facility] OF BIRTH:  [birth year]   DATE OF PROCEDURE:  04/09/2006  DATE OF DISCHARGE:                                  ECHOCARDIOGRAM   REFERRING:  Dr. Catalina Pizza and Dr. Dorethea Clan   CLINICAL DATA:  A 64 year old woman with hypertension, diabetes, anemia and  sepsis.  M-mode:  Aorta 3.1, left atrium 3.9, septum 1.7, posterior wall  1.1, LV diastole 3.2, LV systole 2.4.   1.  Technically-adequate echocardiographic study.  2.  Left atrial size at the upper limit of normal; normal right atrium and      right ventricle.  3.  Normal mitral valve; mild annular calcification.  4.  Normal tricuspid and pulmonic valve; normal proximal pulmonary artery.  5.  Normal diameter of the aortic root with mild to moderate calcification      of the wall.  6.  Minimal aortic valvular sclerosis.  7.  Normal left ventricular size; mild to moderate hypertrophy with      disproportionate septal thickening; hyperdynamic regional and global      function.  8.  Normal IVC.  9.  Comparison with prior study of September 07, 2005:  No segmental wall      motion abnormalities detected on the present examination; estimated      ejection fraction has increased.      [place] Bing, M.D. [facility]  Electronically Signed     RR/MEDQ  D:  04/09/2006  T:  04/10/2006  Job:  [id number]

## 2011-02-10 NOTE — Cardiovascular Report (Signed)
[name], [name]                 ACCOUNT NO.:  [id number]   MEDICAL RECORD NO.:  [id number]          PATIENT TYPE:  INP   LOCATION:  4733                         FACILITY:  [facility]   PHYSICIAN:  Charlies Constable, M.D. [facility] DATE OF BIRTH:  07/18/1946   DATE OF PROCEDURE:  09/12/2005  DATE OF DISCHARGE:  09/13/2005                              CARDIAC CATHETERIZATION   PROCEDURE:  Right and left heart catheterization.   CLINICAL HISTORY:  Ms. Spiewak is 65 years old and has diabetes, and recently  was admitted to the hospital with an infected foot, requiring debridement.  She presented to [facility] with congestive heart failure and was  evaluated with an echo which showed normal LV function.  Her troponin was  elevated.  She was transferred to [facility] for evaluation with  angiography.   PROCEDURE:  Right heart catheterization was performed percutaneously via the  right femoral vein using a venous sheath and Swan-Ganz thermodilution  catheter.  Left heart catheterization was performed percutaneously via the  right femoral artery using arterial sheath and 6-French preformed coronary  catheters.  A femoral arterial puncture was performed and Omnipaque contrast  was used.  A distal aortogram was performed to evaluate her for peripheral  vascular disease.  The right femoral artery was closed with AngioSeal.  The  patient tolerated the procedure well and left the laboratory in satisfactory  condition.   RESULTS:  Left main coronary artery:  The left main coronary artery appeared  to be diffusely diseased with just slightly larger than a 2-mm vessel.  There was mild damping with the guiding catheter.   Left anterior descending artery:  The left anterior descending artery was  diffusely diseased.  There was 70% proximal, 70% mid and 90% distal stenosis  with heavy calcification.  The diagonal branches were severely diseased with  90% stenosis in the first diagonal branch.   Circumflex artery:  The circumflex artery gave rise to an atrial branch, a  marginal branch and a posterolateral branch.  The circumflex artery had 90%  and 80% stenoses in each of the major branches of the marginal branch and  there was a long 90% stenosis in the mid-portion of the circumflex artery  before the posterolateral branch.   Right coronary artery:  The right coronary artery is a moderate-sized vessel  that gave rise to a right ventricular branch, a posterior descending branch  and 2 posterolateral branches.  There were 40% and 50% stenoses in the mid-  vessel.  There was 80% narrowing in the mid-portion of the posterior  descending branch with 80% narrowing in a subbranch.  There was also 90%  narrowing in the first posterolateral branch which crossed the second  posterolateral branch.   LEFT VENTRICULOGRAM:  The left ventriculogram performed in the RAO  projection showed hypokinesis of the apex.  The overall wall motion was good  with an estimated ejection fraction of 55%.   HEMODYNAMIC DATA:  The right atrial pressure was 14 mean.  The pulmonary  artery pressure was 43/21 with a mean of 31.  The  pulmonary wedge pressure  was 20.  The aortic pressure was 145/103 and left ventricular pressure was  145/23.  Cardiac output/cardiac index was 4.7/2.5 L/min per sq m.   CONCLUSION:  Severe three-vessel coronary artery disease with diffuse  narrowing in the left main coronary artery (slightly larger than 2-mm  lumen), diffuse diseased left anterior descending with 70% proximal, 70% mid  and 90% distal stenosis, and 90%  stenosis in the first diagonal branch, 90%  and 80% stenoses in the marginal branch of the circumflex artery with 90%  stenosis in the mid-circumflex artery, 50% stenosis in the mid right  coronary artery with 80% stenosis in the posterior descending branch and 90%  stenosis in the posterolateral branch, and apical wall hypokinesis with an  estimated ejection  fraction of 55%.   RECOMMENDATIONS:  The patient has severe diffuse three-vessel coronary  artery disease.  Her targets are suboptimal for surgical revascularization,  but I think this may be her best option.  We will plan surgical  consultation.           ______________________________  Charlies Constable, M.D. [facility]     BB/MEDQ  D:  09/12/2005  T:  09/14/2005  Job:  [id number]   cc:   [facility]   Vida Roller, M.D.  Fax: [phone]   [facility] Cardiopulmonary Lab

## 2011-02-10 NOTE — H&P (Signed)
[name], [name]                 ACCOUNT NO.:  [id number]   MEDICAL RECORD NO.:  [id number]          PATIENT TYPE:  INP   LOCATION:  A330                          FACILITY:  [facility]   PHYSICIAN:  Hanley Hays. Dechurch, M.D.DATE OF BIRTH:  05/14/1946   DATE OF ADMISSION:  03/07/2006  DATE OF DISCHARGE:  LH                                HISTORY & PHYSICAL   A 65 year old Caucasian female followed by Dr. Lindaann Pascal at [facility] and Dr. Elpidio Anis for osteomyelitis of her  right foot, with complicated past medical history secondary to complications  related to her diabetes, who has been receiving IV vancomycin and  doxycycline at home.  She was noted today on routine blood work to have a  hemoglobin of 7.5.  She does note increased shortness of breath with  exertion but no chest pain.  Recently, she has had some nausea and vomiting  and actually had vomited all night long 5 nights prior and has had poor  appetite since.  She notes she vomited the food stuff she had even that  evening.  In addition, she has had a 40-pound weight loss since December  which was unintentional.  She has had multiple hospitalizations for various  reasons predominantly related to her foot.  She also has known severe 3-  vessel coronary artery disease which is being treated by medical therapy and  had a non-ST MI in December 2006 after her initial foot debridement.  In any  event, she is being admitted to the hospital now for further evaluation and  treatment of her anemia.  The patient has never had endoscopy or  colonoscopy, it was scheduled but deferred secondary to her cardiac events  in December.  She actually has been functionally independent and doing very  well at home and actually was not expecting to be admitted to the hospital.   Her current medical regimen includes:  1.  Doxycycline 100 mg IV q. 12.  2.  Vancomycin 1,000 mg IV every other day since late April 2007.  3.   Hydrochlorothiazide 12.5 daily.  4.  Lotensin 20 daily.  5.  Plavix 75 daily.  6.  Lipitor 80 daily.  7.  Actos 30 daily.  8.  Cymbalta 30 daily.   She, I do not believe, is not allergic to anything, with the exception of  CODEINE which causes nausea.   PAST MEDICAL HISTORY:  1.  Osteomyelitis of the right foot, status post multiple debridements which      initially started as right foot abscess in December 2006.  2.  Diabetes mellitus, poorly controlled.  She is noted to be type 1,      though she is not on any insulin at home.  3.  Anemia, multifactorial.  4.  Renal insufficiency.  5.  Severe 3-vessel coronary artery disease with poor target vessels for      bypass, being managed medically with preserved LV function. 6.  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  6.  History of flash pulmonary  edema and congestive heart failure,  7.  Weight loss.  8.  History of diarrhea, treated as C. diff, though all cultures were      negative.  9.  Status post hysterectomy.  10. Known cerebrovascular disease with a left carotid stenosis, possibly      90%.   SOCIAL HISTORY:  The patient is disabled.  She is trained as a Lawyer.  She has  three adult children, and she lives with her boyfriend in [place].  No  alcohol abuse.  No tobacco abuse.  No drug use.   REVIEW OF SYSTEMS:  She is independent with all ADLs and actually does her  own IVs and wound packing.  Occasional constipation but rare. Weight loss as  noted above with associated anorexia.  No odynophagia.  Frequent reflux.  Occasional nausea which may be related to gastroparesis.  Note some  bilateral lower extremity jumping-like pain sensation at night, not  constant, not consistent with neuropathy,  question restless legs, not on  any therapy.  In fact she has not told her physician.  No GU complaints.  No  fever, chills.  Usually sleeps well, except when the legs bother her.   PHYSICAL EXAMINATION:  GENERAL:  Reveals  a thin female in no distress,  alert, pleasant, and appropriate who does become nauseated during the  interview and vomits.  VITAL SIGNS:  Blood pressure is 160/80, pulse is 66, temperature is  afebrile.  NECK:  Supple.  No JVD.  She has a bruit on the left.  LUNGS:  Clear to auscultation anterior posterior.  HEART:  Regular in rate and rhythm, cannot appreciate a murmur.  ABDOMEN:  Soft.  There is no tenderness.  Bowel sounds are active.  EXTREMITIES:  Without clubbing, cyanosis.  There is no edema.  Feet are warm  with good dorsalis pedis pulses bilaterally. The right foot lesion reveals a  tunneling wound, but the wound itself is clean without drainage or erythema.  No lesions were noted on the left foot.  NEUROLOGIC:  Reveals the patient has reasonably intact sensation.  She has  good movement and muscle strength throughout.  Her neurologic status is  essentially intact.   ASSESSMENT/PLAN:  1.  Anemia, acute on chronic.  At the least, the patient has reflux      esophagitis.  Certainly endoscopy would be reasonable.  She is far      enough out from her non-Q MI, that she should be able to be done safely,      although her blood pressure needs tighter control.  She will be seen in      consultation by gastroenterology.  I am going to begin some empiric      Reglan, given there may be an element of gastroparesis.  She does not      really appear to have a gastric outlet obstruction per say.  We will      also continue Protonix 40 intravenous every day.  She has received 2      units of packed red cells and appears to be tolerating it well.  2.  Renal insufficiency.  Her creatinine is actually up compared to where it      was in April, currently, at 2.1.  Where she was 1.2 at the time of her      discharge from the hospital.  She will receive some gentle intravenous      fluids and monitoring.  BUN is stable.  We  will need to watch for a     volume overload.  3.  Chronic  osteomyelitis of the foot.  Question plan of treatment.  Once      she is able to take p.o.'s, she may be ready to go with p.o.      doxycycline.  We will defer to accepting team.  4.  Known severe cardiovascular disease.  Continue the Lipitor.  Optimize      her blood pressure control.  Hold her Plavix at this point given her      gastrointestinal symptoms until workup is complete.  5.  Probable restless legs.  Consider Requip as needed.  6.  Weight loss.  The patient has multiple reasons for weight loss.  At some      point, this can be further explored.  7.  Diabetes mellitus.  Her hemoglobin A1c in April was 12.4, unfortunately.      We will monitor in the hospital stay and try to get better control.      Unfortunately, her weight loss as a bad prognostic factor, given her      other issues.      Hanley Hays Josefine Class, M.D.  Electronically Signed     FED/MEDQ  D:  03/07/2006  T:  03/07/2006  Job:  [id number]

## 2011-02-10 NOTE — Group Therapy Note (Signed)
NAMESHEYANN, [name]                 ACCOUNT NO.:  [id number]   MEDICAL RECORD NO.:  [id number]          PATIENT TYPE:  INP   LOCATION:  A306                          FACILITY:  [facility]   PHYSICIAN:  Margaretmary Dys, M.D.DATE OF BIRTH:  [birth year]   DATE OF PROCEDURE:  08/31/2005  DATE OF DISCHARGE:                                   PROGRESS NOTE   SUBJECTIVE:  Feels better today.  Says pain was well-controlled overnight.  She did get some sleep and the right foot did not bother her.  She otherwise  feels quite well.  She denies any fever, chills or rigors.  No nausea,  vomiting or diarrhea.  She has a great appetite.   Evaluation by surgery still pending.   OBJECTIVE:  Conscious, alert, comfortable, not in acute distress.  Very  pleasant.  Blood pressure was 117/64, pulse of 90, respiratory rate of 20,  temperature of 98.8.  Blood sugars have ranged between 275-113.  HEENT:  Normocephalic, atraumatic.  Oral mucosa was moist with no exudates.  NECK:  Supple, no JVD, no lymphadenopathy.  LUNGS:  Clear clinically with good air entry bilaterally.  HEART:  S1, S2, regular; no S3, S4, gallops or rubs.  ABDOMEN:  Soft, not tender, bowel sounds were positive.  No masses palpable.  EXTREMITIES:  The patient has swelling with erythema which is not extending  beyond the marked point from yesterday.  The patient has lymphangitis.  NEUROLOGIC exam was grossly intact.   LABORATORY DATA:  White blood cell count 12.8, hemoglobin 9.1, hematocrit  25.5, platelet count 308, neutrophils of 78%.  Sodium is 134, potassium 3.7,  chloride of 97, CO2 of 31, glucose 242, BUN 23, creatinine 1.4.   ASSESSMENT AND PLAN:  [name] is a 65 year old Caucasian female with  cellulitis of the right lower extremity who was found to do very well.   Will continue on IV Unasyn ,awaiting  surgical input on her.   She has an official read of an x-ray of her foot which showed no evidence of  fracture or dislocation.   There is no evidence of arthropathy or focal  neurologic abnormality.      Margaretmary Dys, M.D.  Electronically Signed     AM/MEDQ  D:  08/31/2005  T:  08/31/2005  Job:  [id number]

## 2011-02-10 NOTE — H&P (Signed)
[name], [name]                 ACCOUNT NO.:  [id number]   MEDICAL RECORD NO.:  [id number]          PATIENT TYPE:  INP   LOCATION:  A306                          FACILITY:  [facility]   PHYSICIAN:  Margaretmary Dys, M.D.DATE OF BIRTH:  Sep 29, 1945   DATE OF ADMISSION:  08/30/2005  DATE OF DISCHARGE:  LH                                HISTORY & PHYSICAL   PRIMARY CARE PHYSICIAN:  The patient is unattached.   ADMITTING DIAGNOSES:  1.  Cellulitis plus abscess of the right foot.  2.  Poorly controlled type 1 diabetes.   CHIEF COMPLAINT:  Swelling and redness in the right foot.   HISTORY OF PRESENT ILLNESS:  [name] is a 65 year old Caucasian female with  over 30-year history of type 1 diabetes.  The patient has been doing fairly  well until recently when her blood sugars have been very poorly controlled.  She is currently seeing an endocrinologist to help her.   She noticed last Thursday, which was about a week ago, that she was having  progressive swelling and redness in her right foot.  She denies any trauma  of stepping on any objects.  She denies any significant neuropathy, although  she does have occasional numbness and pins and needles sensation in her  foot.  The patient said the redness got worse and on Monday, she went in to  see her primary care physician.  He recommended Levaquin 500 mg once a day.  However, yesterday the swelling got worse with more pain.  She went in to  see him today, and he told her to come to the emergency room.  The patient  says she has some chills but no fever.  She denies any nausea or vomiting,  diarrhea, or abdominal pain.  Blood sugars have been generally poorly  controlled in the range of 300-400 at home.  She usually gives herself  insulin with sliding scale in addition to her scheduled insulin.   She denies any headaches, dizziness, or lightheadedness, no frequency,  urgency, or dysuria.  Her right leg is essentially unremarkable.  Again,  the  patient is not aware of any trauma, and she wears  shoes all the time.   The patient says she may have had some drainage from that foot earlier  today.  She describes it as puslike.   REVIEW OF SYSTEMS:  A ten-point review of systems is otherwise negative  except as mentioned in History of Present Illness.   PAST MEDICAL HISTORY:  Type 2 diabetes.   MEDICATIONS:  1.  Levaquin 500 mg p.o. once a day, started only three days ago.  2.  Darvocet-N 100 1 p.o. q.4 h. p.r.n., also started three days ago.  3.  Insulin Lantus 10 units subcutaneously b.i.d.  4.  Actos 30 mg p.o. once a day.  5.  Micardis, the patient is unsure of the dose, once a day.   ALLERGIES:  She reports an allergy to CODEINE.  She has nausea.   FAMILY HISTORY:  Positive for  hypertension, coronary artery disease, and  cerebrovascular accident.  SOCIAL HISTORY:  The patient is separated from her husband.  She has three  children. She is currently a Lawyer at [employer].   She has three children. One of them had lymphoma but appears to have  resolved.   The patient quit smoking over 35 years ago and even then, she was a very  mild smoker, she said.  She only drinks alcohol socially. She denies any  illicit drug use.   PHYSICAL EXAMINATION:  GENERAL:  Conscious, alert, comfortable, not in acute  distress, well oriented to time, place, and person.  She was pleasant.  VITAL SIGNS:  Blood pressure 107/68, pulse 109, respiratory rate 20,  temperature 98.5, pain scale 10/10.  HEENT EXAMINATION:  Normocephalic, atraumatic.  Oral mucosa was moist with  no exudates.  NECK:  Supple, no JVD or lymphadenopathy.  LUNGS:  Clear clinically with good air entry bilaterally.  HEART:  S1 and S2 regular, no S3, S4, gallops, or rubs.  ABDOMEN:  Soft, nontender, bowel sounds positive, no masses palpable.  EXTREMITIES:  Her right foot was grossly swollen, tender, and erythematous.  The patient also had an area  of fluctuancy.  The redness extends to her  shin/mid-leg.  I did not see a puncture wound.  She also had some  lymphangitis of the right leg.  The left leg was normal.  CNS EXAMINATION:  Grossly intact with no focal deficits.   LABORATORY DATA:  X-ray of her right foot is pending.   White blood cell count was 12.3, hemoglobin 9.1, hematocrit 26.4, platelet  count 302,000, neutrophils 85%.  Sodium 129, potassium 3.7, chloride 91, CO2  28, glucose440, BUN 22, creatinine 1.3, total bilirubin 0.7, alkaline  phosphatase 77, ALT 19, total protein 6.3, albumin 2.7, calcium 8.7.  Urinalysis was positive for glucose and ketones, a small amount of blood and  protein.  Urine microscopy shows 7-10 wbc's and a few bacteria.   ASSESSMENT AND PLAN:  [name] is a 65 year old patient with  longstanding diabetes which has been recently poorly controlled who presents  to the emergency room with progressive swelling and redness of her right  foot.  Clinical symptoms and physical examination is consistent with  cellulitis and possible abscess of that foot.  An x-ray of that foot is  pending to rule out possible foreign body or osteomyelitis, although I doubt  this based on the acuity of the presentation.   PLAN:  To admit her at this time. We will start her on Unasyn 3 g IV q.6 h.  to broadly cover for gram-positive and anaerobes.  I will control her pain  with morphine p.r.n.  I will resume all of her home medications including  Lantus insulin.  She will be on a sliding scale.   I will resume Humalog insulin 10 units q.a.m.   She will be on a diabetic diet.  I am concerned that she may have an abscess  in that foot and may need incision and drainage.  I will contact [name] Shin C.  Katrinka [name], M.D. as the surgeon to evaluate her.   Deep venous thrombosis prophylaxis will be with Lovenox 40 mg subcutaneously  once a day.  I have discussed the above plan with her and the role of the hospitalist in her  care.  The patient verbalized full understanding.      Margaretmary Dys, M.D.  Electronically Signed     AM/MEDQ  D:  08/30/2005  T:  08/30/2005  Job:  [id number] 

## 2011-02-10 NOTE — Op Note (Signed)
NAMEMITSUKO, LUERA                 ACCOUNT NO.:  [id number]   MEDICAL RECORD NO.:  [id number]          PATIENT TYPE:  INP   LOCATION:  A306                          FACILITY:  [facility]   PHYSICIAN:  Jerolyn Shin C. Katrinka Blazing, M.D.   DATE OF BIRTH:  [birth year]   DATE OF PROCEDURE:  09/01/2005  DATE OF DISCHARGE:                                 OPERATIVE REPORT   PREOPERATIVE DIAGNOSIS:  Abscess and extensive necrotizing infection of  right foot.   POSTOPERATIVE DIAGNOSIS:  Abscess with extensive necrotizing infection of  right foot.   PROCEDURE:  Wide excision of abscess of right foot with radical debridement  of necrotizing infection of right foot.   SURGEON:  Dr. Katrinka Blazing.   DESCRIPTION:  Under spinal anesthesia, the right foot and leg were prepped  and draped in a sterile field. The necrotic tissue between the first and  second web spaces were debrided. There was ischemic necrosis of the soft  tissue of the first webspace. All of this tissue was debrided back to  tendons. This infection just extended in the subcutaneous plane to involve  the tendons and fascia of the first, second and third toe. Extensive  debridement of skin, subcutaneous tissue, and fascia was carried out.  Tendons were not violated in order to make sure that they were viable. The  patient did have good flexion and extension prior to surgery, so the tendons  were felt to be viable. The debridement extended back to about the middle  third of the foot behind the first, second and third toe. Once all this  necrotic debris was removed, cultures were taken, and the wound was cleaned  with a pulse lavage using about 2 liters. It appeared that most of the  necrosis was removed. The wound was then packed with saline-soaked 4x4s  covered with Kerlix gauze. The patient was transferred to a bed and moved to  the postanesthetic care unit for further monitoring.      Dirk Dress. Katrinka Blazing, M.D.  Electronically Signed     LCS/MEDQ  D:   09/01/2005  T:  09/01/2005  Job:  [id number]   cc:   [facility]

## 2011-02-10 NOTE — Discharge Summary (Signed)
NAMEFALLYNN, GRAVETT                 ACCOUNT NO.:  [id number]   MEDICAL RECORD NO.:  [id number]          PATIENT TYPE:  INP   LOCATION:  A330                          FACILITY:  [facility]   PHYSICIAN:  Jerolyn Shin C. Katrinka Blazing, M.D.   DATE OF BIRTH:  13-Sep-1946   DATE OF ADMISSION:  01/08/2006  DATE OF DISCHARGE:  04/25/2007LH                                 DISCHARGE SUMMARY   DISCHARGE DIAGNOSES:  1.  Osteomyelitis, right foot,  2.  Cellulitis with abscess right foot with lymphangitis.  3.  Uncontrolled diabetes mellitus.  4.  Atherosclerotic heart disease with three-vessel disease not amenable to      interventional therapy.  5.  Anemia of chronic disease.  6.  Hypertension.   SPECIAL PROCEDURES:  1.  Wide excision and drainage of the subfascial abscess, plantar aspect      right foot on 20th of  April.   1.  Left subclavian Port-A-Cath insertion for chronic IV antibiotics on 24th      of April.   DISPOSITION:  The patient discharged in stable, satisfactory condition.   MEDICATIONS:  Her medications would be the same as before admission.  These  will include Plavix 75 mg daily, aspirin 81 mg daily, metoprolol 25 mg twice  daily, Lasix 40 mg daily, Lantus as previously used and in variable doses,  ferrous sulfate 25 mg twice daily, potassium chloride 20 mEq twice daily,  Protonix 40 mg daily, Actos 30 mg daily.  She will also receive IV  doxycycline and vancomycin for a period of eight weeks by home health.   SUMMARY:  A 65 year old female with history of abscess of the right foot  with extensive cellulitis.  She was found to have necrotizing infection of  the right foot with involvement of the web space. Between first and second  toe, with some significant involvement of second toe.  She underwent  surgical debridement in December 2006.  She has been followed up since that  time.  The toe actually had been healing quite nicely, but she developed an  area of cellulitis in the midportion of  the plantar surface of the foot with  extensive erythema, with tenderness of the medial aspect of the lower leg  suggestive of ascending cellulitis and lymphangitis.  The patient had  severely uncontrolled diabetes.  She was admitted for IV antibiotics and had  surgical debridement of the foot.  Other history is given in admission note.  The patient was admitted.  Blood sugars were between 400 and 500, so blood  sugars were controlled.  We added NovoLog sliding scale insulin coverage.  After blood sugars were better controlled, she was counseled for surgical  debridement and she was taken to the operating room on 20th of April.  At  that time it was noted that the infection extended beneath the deep plantar  fascia of the foot into the area of the tarsal and the metatarsal bones.  The head of the distal phalanx of the first toe was fully debrided due to  osteomyelitis.  It was felt that the patient  would need at least eight weeks  of IV antibiotics as an outpatient.  She tolerated the surgery well.  Physical therapy became involved and did local wound care while the patient  was hospitalized.  She had episodes of anemia due to chronic disease.  She  also had hyperkalemia which was treated with oral Kayexalate.  She  stabilized, her blood sugars were fairly well-controlled in the hospital.  Electrolytes were better controlled once her diabetes  was better controlled.  She became afebrile.  Because of the need for long-  term IV antibiotics, the patient had a Port-A-Cath inserted on April 24th  without difficulty.  Once this was done plans were made for her to be  discharged and she was discharged on the 25th in satisfactory condition.      Dirk Dress. Katrinka Blazing, M.D.  Electronically Signed     LCS/MEDQ  D:  03/25/2006  T:  03/25/2006  Job:  [id number]

## 2011-02-10 NOTE — Discharge Summary (Signed)
[name], [name]                 ACCOUNT NO.:  [id number]   MEDICAL RECORD NO.:  [id number]           PATIENT TYPE:  INP   LOCATION:  A304                          FACILITY:  [facility]   PHYSICIAN:  Osvaldo Shipper, MD     DATE OF BIRTH:  12/06/1945   DATE OF ADMISSION:  07/19/2006  DATE OF DISCHARGE:  10/29/2007LH                                 DISCHARGE SUMMARY   Please review H&P dictated by Dr. Sherle Poe for details regarding patient's  presenting illness.   DISCHARGE DIAGNOSES:  1. Nonketotic hyperosmolar state, resolved.  2. Poorly controlled type 2 diabetes.  3. Hematuria of unclear etiology requiring followup.  4. Severe diabetic gastroparesis, improved.  5. Chronic renal insufficiency, stable.  6. History of severe three-vessel coronary artery disease, on medical      management only.   BRIEF HOSPITAL COURSE:  Briefly this is a 65 year old Caucasian female who  presented to the ED with nausea, vomiting, and weakness of one day's  duration. When she presented she was found to have a blood sugar of 704 with  sodium 122. Her bicarb was normal. LFTs were normal. She did have elevated  BUN and creatinine of 28 and 2.  However, she does have chronic renal  insufficiency. It was thought that because of her nausea, vomiting, the  patient did not take her insulin, and as a result she developed this  hyperglycemic state. The patient was initially put on insulin drip which  quickly controlled her blood sugars. Then she was switched over to her  Lantus regimen with sliding scale. The patient continued to do well while  she was in hospital. Her blood sugars were eventually controlled on home  regimen once she was starting to take p.o. quite well. Her hemoglobin A1c  came back at 11.3 implying very poor control of her diabetes.   Nausea and vomiting were likely the result of her diabetic gastroparesis.  She has had evaluations done for this in the past. She did have a nuclear  medicine  gastric emptying study done on August 16, which showed abnormal  exam with delayed gastric emptying. The patient was given symptomatic  treatment with Reglan, Phenergan, and IV fluids. Slowly her symptoms  improved, and she started tolerating p.o. intake quite well.   She did have chest x-ray which showed possible left upper lobe density about  1 cm in size. A CT scan was recommended, and she underwent a noncontrast CT  study which showed a lingular 8 x 7 mm nodular opacity of unclear etiology.  They thought this could be the result of the atelectasis that was found  adjacent to this finding. However, they cannot exclude a malignancy. They  did recommend a quick followup to make sure the nodule does not increase in  size. There were also some increased number of normal size mediastinal lymph  nodes found. Some third spacing of fluid was noted.  Atherosclerotic changes  were also seen on the chest CT. We have discussed this finding with the  patient and have arranged for a 71-month CT scan of  her chest.   Hematuria. During the course of this admission the patient developed gross  hematuria. Urology consultation was obtained, and Lovenox and Plavix were  held. Initially a CAT scan of the abdomen and pelvis without contrast was  done which, showed a right-sided renal stone, without hydronephrosis,  hydroureter, or ureterolithiasis. Mild left-sided pelvic calyectasis was  noted. Prominent appendix was noted and some diffuse small bowel mesenteric  haziness was also noted which is a nonspecific finding. Dr. Dennie Maizes  from urology saw the patient. He also did a cystoscopy today which he had  reported to me as being unremarkable. He recommended that the patient maybe  resume Plavix and that she would need to follow up with him in two weeks'  time. Her Foley catheter was also removed. However, her hematuria had  cleared up even as these evaluations were being done.   Her renal failure, as  mentioned above, was chronic and stable. Her anemia  was also stable. Really no other abnormalities are appreciated. She did have  findings along with the hematuria suspicious for cystitis, hence she was  started on Cipro which will be continued.   On the day of discharge her blood pressures are running a little bit high.  Systolic was 200. Clonidine was given times one, which was trending  downward. Last reading was 180. The patient was completely asymptomatic with  no headaches, chest pain or shortness of breath. I anticipate, as she  initiates her blood pressure medications, her blood pressure should come  down even further. Since the patient was feeling quite well, tolerating her  p.o. intake, and all her vital signs were all stable and her hematuria  cleared up, she was considered stable for discharge.   DISCHARGE MEDICATIONS:  1. Cipro 500 mg p.o. b.i.d. for two weeks.  2. Prevacid 30 mg p.o. once daily.  3. Otherwise, she was asked to resume all of her outpatient medications as      before. Please see H&P for a complete history.   FOLLOWUP:  1. With Dr. Dennie Maizes in two weeks time.  2. CT chest without contrast in three months to follow up on the lung      nodule.  3. With her PMD as needed.   DIET:  She may have a modified carbohydrate diet, medium.   PHYSICAL ACTIVITY:  No restrictions.   Consultation obtained from Dr. Dennie Maizes of urology.   Total time spent at discharge about 35 minutes.      Osvaldo Shipper, MD  Electronically Signed     GK/MEDQ  D:  07/23/2006  T:  07/23/2006  Job:  [id number]   cc:   Dennie Maizes, M.D.  Fax: [phone]

## 2011-02-10 NOTE — Group Therapy Note (Signed)
[name], [name]                 ACCOUNT NO.:  [id number]   MEDICAL RECORD NO.:  [id number]          PATIENT TYPE:  INP   LOCATION:  A330                          FACILITY:  [facility]   PHYSICIAN:  Osvaldo Shipper, MD     DATE OF BIRTH:  03/02/1946   DATE OF PROCEDURE:  03/09/2006  DATE OF DISCHARGE:                                   PROGRESS NOTE   SUBJECTIVE:  The patient states she is feeling sleepy.  She just returned  from her endoscopic procedures.  She does not have any pain at this time.  No shortness of breath, no chest pain.   OBJECTIVE:  VITAL SIGNS:  Temperature 98.5, heart rate 90, respiratory rate  16, blood pressure 136/63, CBG is running 179, 231, 207, 173.  LUNGS:  Clear to auscultation bilaterally.  No wheezes, rales, or rhonchi.  CARDIOVASCULAR:  S1, S2 is normal, regular, no murmurs appreciated.  ABDOMEN:  Soft, nontender, nondistended, bowel sounds are present, no mass  or organomegaly present.  EXTREMITIES:  Without edema.   LABORATORY DATA:  White count is 12.8, hemoglobin 11.1 from 12 yesterday, it  was 7.5 on the day of admission.  Platelet count of 376,000, MCV is 84.5.  Sodium is 133, potassium is 3.1, chloride is 98, bicarbonate 27, glucose  207, BUN is 22, creatinine has improved to 1.9 from 2.1.  BUN has also  improved from 30.  LFTs are normal.  Albumin is 2.2.  Her iron panel shows  iron 30, TIBC 122, ferritin is 582, percent saturation is 25.   ASSESSMENT/PLAN:  1.  This is a 65 year old Caucasian female with past medical history of      coronary artery disease, diabetes, osteomyelitis of the right foot,      chronic anemia, renal insufficiency, who was found to have low      hemoglobin when home health checked her blood work 2 days ago.  She was      transfused 2 units of blood.  Because of her significant anemia, the      patient underwent EGD and colonoscopy.  It showed erosive esophagitis,      erosive gastritis.  Colonoscopy was within normal  limits.  No clear      etiology for anemia is ascertained at this time.  She has not had a      stool checked for occult blood.  I think if her hemoglobin is stable      tomorrow, she may be able to go home.  She can have stool occult testing      as an outpatient, and if it is positive and if she continues to have      anemia, she might benefit from a capsule study.  We will defer these      issues to gastroenterology at this time.  She might benefit from iron      supplementation.   1.  Diabetes, not well controlled.  She is currently just on oral      hyperglycemic agents.  It is not clear if the patient is  on Lantus at      home or not.  She is currently getting her CBGs q.6 h. and getting      coverage with sliding scale.  Her Lantus will need to be clarified with      her.   1.  Renal insufficiency is improving.  She usually, at baseline, has a      creatinine of about 1.1 or 1.2.  It did go up during this admission.      She was on hydrochlorothiazide at home.  With some gentle hydration, her      parameters have improved, and if they continue to improve tomorrow, I      think this can be followed up as an outpatient.   1.  Coronary artery disease is stable.   1.  Osteomyelitis of the right foot.  The patient is being followed by Dr.      Katrinka Blazing.  She is getting IV vancomycin and doxycycline at home which we      are continuing over here.  I think she may return to Dr. Katrinka Blazing for      further management of these issues.   DISPOSITION:  If the patient's blood count is stable tomorrow and if her  renal function is also either stable or improving, I think she may be able  to go home.      Osvaldo Shipper, MD  Electronically Signed     GK/MEDQ  D:  03/09/2006  T:  03/09/2006  Job:  [id number]   cc:   Sol Blazing, M.D.  Fax: [phone]   Lindaann Pascal, M.D.

## 2011-02-10 NOTE — H&P (Signed)
[name], DEATON NO.:  [id number]   MEDICAL RECORD NO.:  [id number]          PATIENT TYPE:  INP   LOCATION:  A306                          FACILITY:  [facility]   PHYSICIAN:  Osvaldo Shipper, MD     DATE OF BIRTH:  1946/01/30   DATE OF ADMISSION:  10/05/2005  DATE OF DISCHARGE:  LH                                HISTORY & PHYSICAL   PRIMARY PHYSICIAN:  [facility].   ADMITTING DIAGNOSIS:  1.  Acute diarrhea, rule out Clostridium difficile infection.  2.  Diabetes requiring insulin.  3.  Three-vessel coronary artery disease, on medical management.  4.  Right foot cellulitis, status post surgical intervention.   CHIEF COMPLAINT:  Diarrhea for the past two weeks.   HISTORY OF PRESENT ILLNESS:  The patient is a 65 year old Caucasian female  with a past medical history as outlined above, who was admitted to this  hospital back in December 2006 for cellulitis of her right foot,  specifically of the second toe.  The patient underwent surgical debridement  which was done by Dr. Elpidio Anis.  The patient was put on antibiotics at  this time and was doing well when she developed flash pulmonary edema and  underwent echocardiogram which showed hypokinesis of the apex; however, her  EF was about 55%.  The patient was transferred to [facility] for a  cardiac catheterization which showed severe three-vessel disease amenable  only to medical management. The patient was discharged from [facility] in good condition, was prescribed Augmentin and continued for about  10 more days.   The patient's current symptoms started about Christmas time 2006.  Initially  it presented as nausea followed by emesis, mostly of food material that she  was taking.  It subsequently became clear.  At no point did she have any  brown emesis or any blood in her emesis.   This onset of nausea and vomiting was immediately followed by onset of  diarrhea which  was described as dark stool, sometimes watery and sometimes  semi-solid, about 5-6 times per day.  She had no history of any blood in the  stools.  Occasionally she would see some mucus in the stool, but she did not  have any stool floating in the toilet bowl.  No history of fever, although  she did have some chills at home.  The patient continued to have diarrhea  through the night as well.  The patient also gives a history of abdominal  pain which is mostly left-sided on and off, described as a sharp pain with  no radiation.   The patient went to her PMD last Friday for evaluation of this diarrhea.  She was asked to increase her hydration, but she did not improve; hence, she  went back this past Tuesday when she was prescribed Imodium.  However, she  has not experienced any relief of her symptoms.   The patient states she has lost about 24 pounds in the last one month, since  her discharge during the previous hospitalization.  The  patient has never  had these symptoms before.  She has never had any kind of endoscopies, upper  or lower.   She thinks that her right foot infection is getting better.  She undergoes  wound care three times per week, when she has a therapist who comes to her  house, and dresses her wound.  She has not followed up with Dr. Katrinka Blazing since  discharge from the hospital.   MEDICATIONS:  1.  Plavix 75 mg p.o. daily.  2.  Aspirin 81 mg daily.  3.  Lipitor 80 mg q.h.s.  4.  Metoprolol 25 mg b.i.d.  5.  Furosemide 40 mg once per day.  6.  Micardis HCT 40/12.5 once per day.  7.  Actos 30 mg daily.  8.  Lantus insulin 10 units q.h.s. subcutaneously.  9.  Ferrous sulfate 325 mg b.i.d.  10. Protonix 40 mg daily.  11. Potassium chloride 20 mEq b.i.d.  12. Imodium p.r.n.  13. Phenergan p.r.n.   ALLERGIES:  CODEINE CAUSES BEHAVIOR PROBLEMS.   PAST MEDICAL HISTORY:  1.  Insulin-requiring diabetes.  2.  Right foot cellulitis.  3.  Triple-vessel coronary artery  disease on medical management.  4.  History of hysterectomy in the past.  5.  History of foot surgery with Dr. Katrinka Blazing.  6.  No other surgeries.   SOCIAL HISTORY:  The patient lives in [place] with her fiancee.  No smoking  currently, occasional smoking in the past, a long time ago.  No alcohol and  no illicit drug use.  The patient occasionally uses a walker to ambulate.   FAMILY HISTORY:  Significant for diabetes in both her parents; heart disease  and liver cancer in her father; lung cancer in her mother; son has lymphoma.  No other GI cancers.   REVIEW OF SYSTEMS:  A 10-point review of systems was done which was  unremarkable except as mentioned in the HPI.   PHYSICAL EXAMINATION:  VITAL SIGNS:  Temperature on presentation was 98,  heart rate 96, blood pressure 109/60, respiratory rate 16, saturations not  recorded.  The patient's orthostatic's were checked, and she was clearly  orthostatic.  GENERAL EXAM: This is a well-developed, well-nourished individual in no  apparent distress, very pleasant to talk to.  HEENT:  There is no pallor, no icterus.  Oral mucous membranes are slightly  dry.  No oral lesions are seen.  NECK:  Soft and supple.  LUNGS:  Clear to auscultation bilaterally.  No crackles or wheezing is  heard.  CARDIOVASCULAR:  S1 and S2, normal, regular.  There is a systolic murmur  appreciated at the apex, no S3, no S4 and no rubs are heard.  ABDOMEN:  Soft.  There is tenderness in the left lower quadrant, no rebound,  no rigidity, no guarding.  RECTAL EXAM:  This was done by ED physician and stool was heme-negative,  apparently.  EXTREMITIES:  The right lower extremity reveals slight erythema in the right  foot near the first toe, extending approximately halfway up the foot.  There  is an open wound noticed in the area of the second toe, which seems to have  a slight yellowish exudate but no active oozing is noted.  No bleeding is noted on that side.  NEUROLOGICAL:   The patient is alert and oriented x3.  No gross deficits  appreciated.   LABORATORY DATA:  Her white count is slightly elevated at 12.6, 78%  neutrophils, no bands reported.  Hemoglobin is 9.4 which  is a slight  improvement from before, but she is also dehydrated.  MCV is 85, platelet  count 331,000.  PT is 14.5, INR 0.1.  Sodium is low at 122, potassium 5.4,  chloride 89, bicarb 25, glucose 456, BUN 19, creatinine 1.4.  Total protein  5.5, albumin 2.5, calcium 8.4.  UA shows more 1000 glucose, small bilirubin,  trace ketones, small blood.  No evidence for infection.  Anion gap on the  metabolic profile is 8.   Stool studies have been ordered by the ED physician and have been sent for  the stool, and they are pending.  These include Clostridium difficile, ova,  parasites, culture and WBCs.   No imaging studies have been done.   IMPRESSION:  This is a 65 year old Caucasian female with medical problems,  as outlined above, who presents with diarrhea of two-week duration.  She  also has nausea and vomiting.  She gives some history of heartburn but not  much.  Her symptoms started after she was on antibiotics.  Top of the  differential is C. diff colitis.  She also has hyperglycemia, hyperkalemia,  hyponatremia, which are all probably related to dehydration.  She also has  mild acute renal failure.   PLAN:  1.  GI.  We will empirically start the patient on Flagyl.  We will await      stool studies to come back.  The patient does have significant anemia.      An iron binding profile study was checked last admission and did not      totally suggest iron-deficiency as the ferritin was high; however, she      did have an infection and ferritin could have been because of      inflammation.  I think I am going to go ahead and have consultations      done by gastroenterology to consider endoscopy in this patient.  2.  The patient also experienced weight loss of about 25 pounds in the last       one month.  She was started on Lasix also about one month ago, but that      is quite a significant weight loss even while being on Lasix.  3.  Diabetes.  She is hyperglycemia.  With IV fluids, this should come down.      I will hold her Actos until she is able to take p.o. properly.  I will      continue Lantus at a slightly lower dose to make sure she does not get      hypoglycemic.  I will put her on sliding-scale insulin.  We will check a      CBG and cover with insulin as needed.  A hemoglobin A1c was checked back      in December 2006, which was 11.1, implying very poor control of her      diabetes.  4.  Cardiac.  Previous admission as mentioned above, patient was evaluated      because she had an apparent episode of flash pulmonary edema.  She has      to triple-vessel disease which is amenable only to medical treatment.     We will hold her aspirin and Plavix for now to make sure her hemoglobin      does not drop. This may be restarted at a later date.  I will also hold      her other cardiac meds which can potentially lower her blood pressure.  5.  Cellulitis.  It appears to be stable from what I can see and from based      on patient's history.  However, we will have Dr. Katrinka Blazing come and look at      it, too, as she has not seen him in the past many weeks.  We will not      start any antibiotics as of now.  6.  Hyponatremia.  Probably because of dehydration and also she was on      Lasix.  Hopefully the sodium will recover with IV hydration.  7.  Hyperkalemia.  Also likely related to the above.  We will ger her one      dose of Kayexalate and recheck      tomorrow morning.  8.  Mild prerenal azotemia.  This should also recover with IV hydration.   Further management decisions will be based on the results of initial testing  and patient's response to treatment.      Osvaldo Shipper, MD  Electronically Signed     GK/MEDQ  D:  10/05/2005  T:  10/05/2005  Job:  [id number]    cc:   Magnus Sinning. Rice, M.D.  Fax: [phone]   Dirk Dress Katrinka Blazing, M.D.  Fax: [phone]   Lionel December, M.D.  [address]  [place]  [place] [address]

## 2011-02-10 NOTE — Group Therapy Note (Signed)
NAME:  [name], [name]                 ACCOUNT NO.:  [id number]   MEDICAL RECORD NO.:  [id number]          PATIENT TYPE:  INP   LOCATION:  A313                          FACILITY:  [facility]   PHYSICIAN:  Margaretmary Dys, M.D.DATE OF BIRTH:  10-29-1945   DATE OF PROCEDURE:  DATE OF DISCHARGE:                                   PROGRESS NOTE   SUBJECTIVE:  The patient continues to feel well, stable.  No pain.  No fever  or chills.  She feels great overall.  I had discussions with Dr. Kristian Covey,  the nephrologist, regarding some abnormal lab work and he would like the  hematologist, Dr. Mariel Sleet, consulted.   OBJECTIVE:  Conscious, alert, comfortable, not in acute distress.  VITAL SIGNS:  Blood pressure 160/76.  Pulse 78.  Respirations 18.  Temperature 98.6.  Blood sugars have ranged between 76-268.  HEENT EXAM:  Normocephalic, atraumatic.  Oral mucosa is moist with no  exudates.  NECK:  Supple.  No JVD.  LUNGS: occasional crackles at the bases.  HEART:  Regular S1, S2.  ABDOMEN:  Soft, nontender.  EXTREMITIES:  Bilateral pitting pedal edema all the way up to the mid leg.  No induration or tenderness was noted.  The patient has chronic stasis  dermatitis.   LABORATORY STUDIES/DIAGNOSTIC DATA:  White blood cell count 10.1.  Hemoglobin 9.6.  Hematocrit 27.8.  Platelet count 421 with no left shift.  Sodium 134.  Potassium 3.7.  Chloride 104.  CO2 23.  Glucose 65.  BUN 44.  Creatinine 4.3.  Potassium 8.  Urine electrophoresis shows increased kappa excretion and increased kappa  and lambda ratio.  Blood cultures have remained negative.  A and E is negative.   ASSESSMENT AND PLAN:  1.  Abnormal urine studies, unclear interpretation.  We have requested Dr.      Mariel Sleet to see her as she may  have some significant gammopathy which      may be contributing to her worsening kidney function.  2.  Renal insufficiency which is acute and chronic.  We appreciate Dr.      Susa Griffins input.  Will  continue on current treatment.  He has increased      IV fluids to see if this will improve urine output.  3.  Diabetes.  Remains difficulty to control.  Will continue on all oral      hypoglycemic agents and insulin regimen. Will plan to make changes as      needed to insulin regimen.      Margaretmary Dys, M.D.  Electronically Signed    AM/MEDQ  D:  04/15/2006  T:  04/15/2006  Job:  [id number]

## 2011-02-10 NOTE — Consult Note (Signed)
[name], [name]                 ACCOUNT NO.:  [id number]   MEDICAL RECORD NO.:  [id number]          PATIENT TYPE:  INP   LOCATION:  A218                          FACILITY:  [facility]   PHYSICIAN:  Jorja Loa, M.D.DATE OF BIRTH:  1945-10-31   DATE OF CONSULTATION:  04/06/2006  DATE OF DISCHARGE:                                   CONSULTATION   REASON FOR CONSULTATION:  Renal insufficiency.   HISTORY OF PRESENT ILLNESS:  Ms. Swilling is a 65 year old Caucasian female with  past medical history of diabetes for 30 years, history of CVA, history of  coronary artery disease who presently came with complaints of weakness, some  nausea and vomiting. She was found to have severe anemia and also elevated  BUN and creatinine. Hence admitted to the hospital. Ms. Cashatt states that the  last time she was here a couple of months ago she was told her kidneys were  weak. Otherwise, no history of renal insufficiency. Also, she does not  remember any history of passing kidney stones. The patient has diabetic  retinopathy but has never been treated with laser treatment. She also some  diabetic neuropathy with peripheral vascular disease and history of  cellulitis and osteomyelitis. Presently, she denies any leg swelling. No  history of proteinuria.   PAST MEDICAL HISTORY:  As stated above.  1.  Patient with history of diabetes for many years, more than 30 years.  2.  History of coronary artery disease.  3.  History of CHF.  4.  History of hypertension.  5.  History of osteomyelitis.  6.  History of anemia.  7.  History of renal insufficiency. However, the duration is not clear.  8.  History of restless leg syndrome.  9.  History of left carotid stenosis.  10. History of hypertension.  11. History of CVA.  12. History of also a kidney stone from the ultrasound done many years ago.   MEDICATIONS:  Her medications at this moment consist of:  1.  Aspirin 81 mg p.o. daily.  2.  Lipitor 80 mg p.o.  daily.  3.  Reglan 10 mg IV q.6h.  4.  Protonix 40 mg IV q.24h.  5.  She is getting IV fluid at 150 cc per hour.  6.  Lasix on p.r.n. basis.  7.  Insulin.  8.  She is also getting some Zofran.  9.  Vancomycin 1000 mg IV q.24h.   ALLERGIES:  SHE IS ALLERGIC TO CODEINE.   SOCIAL HISTORY:  No history of smoking. No history of alcohol abuse.   FAMILY HISTORY:  No history of renal insufficiency. No history of diabetes.  The patient has 3 children who are adults. She lives in [place].   REVIEW OF SYSTEMS:  At this moment, she feels better. She does not have any  nausea or vomiting. She feels weak and tired. Otherwise feels okay. No  history of chest pain. No shortness of breath, orthopnea, or paroxysmal  nocturnal dyspnea. She denies also any swelling of the legs.   PHYSICAL EXAMINATION:  GENERAL:  The patient is alert in  no apparent  distress.  VITAL SIGNS:  Her blood pressure is 114/51, temperature 97.9, pulse 87,  respiratory rate 20.  HEENT:  She had some conjunctival pallor. Nonicteric. Oral mucosa seems to  be dry.  NECK:  Supple. No JVD.  CHEST:  Clear to auscultation. No rales. No rhonchi. No egophony.  HEART:  Regular regular rate and rhythm. No murmur.  ABDOMEN:  Soft. Positive bowel sounds.  EXTREMITIES:  No edema.   LABORATORY DATA:  White blood cell count is 16, hemoglobin 8.4, hematocrit  24.1. Yesterday, when she came, her hemoglobin was 7.4, hematocrit 22. She  has received 1 unit of blood transfusion. Her sodium is 126; yesterday, it  was 120. Potassium 3.9, BUN 40, and creatinine 4.2. yesterday, her BUN was  48 and creatinine was 4.8. Her creatinine was 1.2 in April of this year, and  BUN was 16. In June of this year, she had iron studies which showed ferritin  to be 582 and iron saturation of 255. At this moment, no UA is done.   ASSESSMENT:  1.  Renal insufficiency, most likely at this moment seems to be acute as the      patient has creatinine of 1.1 and 1.2  just recently. The present      increase in BUN and creatinine could be secondary to ACE inhibitor as      she was on lisinopril as an outpatient and also probably accompanied      with dehydration. Her BUN and creatinine at this moment seems to be      trending down.  2.  Hyponatremia. Probably secondary from intravascular volume depletion      with replacement of hypotonic fluids, sodium 120 and improved 126.  3.  Osteomyelitis. She is on antibiotics.  4.  History of anemia. Probably anemia of chronic disease. I am not sure      whether this is associated with her chronic infection as her renal      insufficiency seems to be pretty acute. Hence, I am not sure whether      chronic renal failure also plans a roll.  5.  Severe hypoalbuminemia with albumin of 2.1. This could be from      malnutrition infection such as osteomyelitis. Since she has history of      diabetes, if she has proteinuria, probably that also may contribute.  6.  History of hypertension. Blood pressure seems to be reasonably      controlled.  7.  History of previous kidney stone. At this moment, there is no      documentation whether there is any recurrence problem.  8.  History of coronary artery disease.  9.  History of left carotid artery stenosis.  10. History of peripheral vascular disease.  11. History of restless leg syndrome.  12. History of diabetic gastropathy.  13. History of also gastroesophageal reflux disease.   RECOMMENDATIONS:  Will do ultrasound of the kidneys. Will 24-hour urine for  protein and also possibly creatinine clearance. Will continue with other  medication and will check ANA complement to rule out secondary cause of  proteinuria and renal insufficiency. I agree with hydration. Probably at  this moment, will hold her Lasix. Also will do ultrasound of the kidney to  evaluate kidney size.      Jorja Loa, M.D. Electronically Signed     BB/MEDQ  D:  04/06/2006  T:   04/06/2006  Job:  [id number]

## 2011-02-10 NOTE — Discharge Summary (Signed)
NAMEDIEGO, DELANCEY                 ACCOUNT NO.:  [id number]   MEDICAL RECORD NO.:  [id number]          PATIENT TYPE:  INP   LOCATION:  4733                         FACILITY:  [facility]   PHYSICIAN:  Charlies Constable, M.D. [facility] DATE OF BIRTH:  23-Apr-1946   DATE OF ADMISSION:  09/11/2005  DATE OF DISCHARGE:  09/13/2005                                 DISCHARGE SUMMARY   PRINCIPAL DIAGNOSIS:  Non-ST-elevation myocardial infarction/coronary artery  disease.   SECONDARY DIAGNOSES:  1.  Congestive heart failure with normal left ventricular function.  2.  Type 2 diabetes mellitus.  3.  Hypertension.  4.  Cerebrovascular disease, followed by Dr. Madilyn Fireman.  5.  Right foot abscess, status post debridement.  6.  Hyperlipidemia.  7.  Remote tobacco abuse.  8.  Iron deficiency anemia.  9.  Gastroesophageal reflux disease.  10. Nephrolithiasis.  11. History of hepatic granuloma.   ALLERGIES:  CODEINE.   PROCEDURES:  Left heart catheterization.   HISTORY OF PRESENT ILLNESS:  This is a 65 year old white female who was  admitted to [facility] on August 30, 2005 with cellulitis and  abscess of the right foot.  She underwent wide excision and debridement of  her abscess on September 01, 2005 and was treated with antibiotics  intravenously.  On September 05, 2005, she had sudden onset of shortness of  breath with coughing, without any recurrence of symptoms following that  episode, which itself lasted only 1-2 minutes.  Following this, a CT of her  chest was performed which showed moderate bilateral pleural effusions,  bilateral compressive atelectasis, no evidence of pulmonary embolism and CHF  with diffuse body wall edema.  As a result, Dr. Dorethea Clan was consulted and  decision was made to transfer her to [facility] for further evaluation,  diuresis and cardiac catheterization.   HOSPITAL COURSE:  Ms. Stryker underwent cardiac catheterization on September 12, 2005, which revealed diffuse multivessel  disease with poor distal targets.  EF was 55% with apical hypokinesis.  She appeared to be a poor cardiac  bypass candidate and Cardiothoracic Surgery was consulted and agreed,  recommending medical therapy.  She was initiated on beta blocker, aspirin,  statin and Plavix therapy, and continued on ARB, which she was on at home.  Secondary to anemia with stable hemoglobins in the 8-9 range and stable  hematocrits in the 23-25 range, GI was consulted.  Her stool was negative  for fecal occult blood.  They recommended outpatient EGD and colonoscopy as  well as PPI therapy.  Ms. Ledonne has been up and out of bed this morning  without any recurrent shortness of breath or chest pain.  She has been seen  by Dr. Juanda Chance and is being discharged home today in satisfactory condition.   DISCHARGE LABORATORY DATA:  Hemoglobin 8.2, hematocrit 23.6, WBC 8.3,  platelets 599,000.  Sodium 137, potassium 3.4, chloride 102, CO2 28, BUN 7,  creatinine 1.0, glucose 164, calcium 8.2.  BNP 1552.9.   DISPOSITION:  The patient is being discharged home today in good condition.   FOLLOWUP PLANS AND APPOINTMENTS:  She has a followup appointment with Dr.  Dorethea Clan in [place] on September 26, 2005 at 1:30 p.m.  She is asked to follow  up with her primary care physician at [facility]  next week and will also have wound care followup at [facility].   DISCHARGE MEDICATIONS:  1.  Lopressor 25 mg twice daily.  2.  Lasix 40 mg daily.  3.  Plavix 75 mg daily.  4.  Aspirin 81 mg daily.  5.  Avapro 75 mg daily.  6.  Cymbalta 30 mg daily.  7.  Actos 30 mg daily.  8.  Ferrous sulfate 325 mg twice daily with meals.  9.  Lantus 10 units twice daily.  10. Augmentin 875 mg twice daily x10 days.  11. Lipitor 80 mg nightly.  12. Nitroglycerin 0.4 mg sublingual p.r.n. chest pain.  13. Protonix 40 mg daily.  14. K-Dur 40 mEq daily.   OUTSTANDING LABORATORY STUDIES:  None.   DURATION OF DISCHARGE  ENCOUNTER:  Forty-five minutes including physician  time.      Ok Anis, NP    ______________________________  Charlies Constable, M.D. [facility]    CRB/MEDQ  D:  09/13/2005  T:  09/15/2005  Job:  [id number]   cc:   Vida Roller, M.D.  Fax: [phone]   [facility]

## 2011-02-10 NOTE — H&P (Signed)
[name], BEEGHLY                 ACCOUNT NO.:  [id number]   MEDICAL RECORD NO.:  [id number]          PATIENT TYPE:  INP   LOCATION:  A302                          FACILITY:  [facility]   PHYSICIAN:  Jerolyn Shin C. Katrinka Blazing, M.D.   DATE OF BIRTH:  04-21-1946   DATE OF ADMISSION:  01/08/2006  DATE OF DISCHARGE:  LH                                HISTORY & PHYSICAL   This is a 65 year old female with a history of abscess of her right foot  with excessive cellulitis.  She was found to have necrotizing infection of  the right foot with involvement of the web space between the first and  second toe, significant involvement of the second toe.  She underwent  surgical debridement in December.  She has been followed since that time.  Her toe had actually been healing quite nicely, but she has developed an  area of cellulitis in the mid portion of the plantar surface of the foot  with extensive erythema with some tenderness of the medial aspect of her  lower leg suggestive of developing ascending cellulitis and lymphangitis.  The patient has severely uncontrolled diabetes.  She is admitted for IV  antibiotics and she may need to have surgical debridement of her foot.   PAST MEDICAL HISTORY:  She has a long history of diabetes mellitus, insulin  dependent.  She has been severely uncontrolled.  She also has three vessel  coronary artery disease with hypokinesis of the apex of her left ventricle.  The patient has a history of pulmonary edema.   PAST SURGICAL HISTORY:  Hysterectomy  and surgical debridement of the right  foot.   MEDICATIONS:  Plavix 75 mg daily, aspirin 81 mg daily, Metoprolol 25 mg  b.i.d., Lasix 40 mg daily.  She uses Lantus but does not know the dosage.  She is also on ferrous sulfate 325 mg b.i.d., potassium chloride 20 mEq  b.i.d., Protonix 40 mg daily, and Actos 30 mg daily.   ALLERGIES:  CODEINE.   SOCIAL HISTORY:  She was formerly a Lawyer.  She resides with her boyfriend.  There is  no alcohol or drug use.  She has smoked in the past but does not  smoke at the present time.   FAMILY HISTORY:  Positive for lymphoma with lung cancer, liver cancer, heart  disease, and diabetes.   PHYSICAL EXAMINATION:  GENERAL:  She is a middle aged female who does not appear to be toxic.  VITAL SIGNS:  Blood pressure 106/56, pulse 63, respirations 18, weight 171  pounds.  HEENT:  Unremarkable.  NECK:  Supple, no JVD, bruit, adenopathy, or thyromegaly.  LUNGS:  Clear to auscultation. No rales, rubs, rhonchi, or wheezes.  HEART:  Grade 1 systolic murmur, no gallop, normal S1 and S2.  ABDOMEN:  Soft, nontender, positive bowel sounds, no masses.  EXTREMITIES:  There is diffuse swelling of the right lower extremity to the  knee, there is a healing ulcer in the web space of the first and second toe  with exposed tendon on the plantar aspect of the foot, there are  some bony  fragments that are palpable in the head of the first toe, there is a small  amount of exudate, there is no bleeding.  Both toes appear to be viable.  The operative area extends on the dorsum of the toe, this was about 2-3 cm  over the metatarsals.  There is a puncture wound in the middle of the arch  of the right foot without drainage, there is a small blister with extensive  erythema that extends from the anterior aspect of the calcaneus to the head  of the metatarsals and encompasses the foot from medial to lateral.  This is  extremely tender.  There is cellulitis extending up on the medial aspect of  the foot to about the level of the medial malleolus.  There is tenderness  along the saphenous vein medially to about the knee, but there is no  discernible inflammation or erythema.  There is no induration.  The edema of  the foot is non-pitting.  NEUROLOGICAL:  She is intact without any gross motor or sensory deficit.   IMPRESSION:  1.  Possible puncture wound, plantar aspect, right foot with new area of       infection with cellulitis in the plantar aspect of the foot with      ascending lymphangitis.  2.  Healing area of necrotizing infection in the web space of the first and      second toe with major involvement of the distal phalanx of the first      toe.  3.  Insulin dependent diabetes mellitus, uncontrolled.  4.  Atherosclerotic heart disease with documented three vessel disease not      amenable to interventional therapy.  5.  Anemia of chronic disease.  6.  Hypertension.   PLAN:  The patient is being admitted.  She will be started on vancomycin and  gentamicin until we can get cultures.  We will get an MRI of her foot.  We  will monitor her healing and response to antibiotic therapy and if there  appears to be any extension of the cellulitis, the patient will have  surgical debridement of the area.      Dirk Dress. Katrinka Blazing, M.D.  Electronically Signed     LCS/MEDQ  D:  01/11/2006  T:  01/11/2006  Job:  [id number]   cc:   Magnus Sinning. Rice, M.D.  Fax: [phone]

## 2011-02-17 ENCOUNTER — Other Ambulatory Visit: Payer: Self-pay | Admitting: Nephrology

## 2011-02-17 ENCOUNTER — Encounter (HOSPITAL_COMMUNITY): Payer: Medicare Other

## 2011-02-17 LAB — POCT HEMOGLOBIN-HEMACUE: Hemoglobin: 11.2 g/dL — ABNORMAL LOW (ref 12.0–15.0)

## 2011-02-23 ENCOUNTER — Encounter: Payer: Self-pay | Admitting: Physician Assistant

## 2011-03-03 ENCOUNTER — Other Ambulatory Visit: Payer: Self-pay | Admitting: Nephrology

## 2011-03-03 ENCOUNTER — Encounter (HOSPITAL_COMMUNITY): Payer: Medicare Other | Attending: Nephrology

## 2011-03-03 DIAGNOSIS — N184 Chronic kidney disease, stage 4 (severe): Secondary | ICD-10-CM | POA: Insufficient documentation

## 2011-03-03 DIAGNOSIS — D638 Anemia in other chronic diseases classified elsewhere: Secondary | ICD-10-CM | POA: Insufficient documentation

## 2011-03-03 LAB — IRON AND TIBC
Saturation Ratios: 36 % (ref 20–55)
TIBC: 217 ug/dL — ABNORMAL LOW (ref 250–470)
UIBC: 138 ug/dL

## 2011-03-03 LAB — RENAL FUNCTION PANEL
CO2: 22 mEq/L (ref 19–32)
Calcium: 9.4 mg/dL (ref 8.4–10.5)
Chloride: 100 mEq/L (ref 96–112)
Creatinine, Ser: 3.6 mg/dL — ABNORMAL HIGH (ref 0.4–1.2)
GFR calc Af Amer: 15 mL/min — ABNORMAL LOW (ref >60)
Glucose, Bld: 111 mg/dL — ABNORMAL HIGH (ref 70–99)

## 2011-03-06 LAB — FERRITIN: Ferritin: 673 ng/mL — ABNORMAL HIGH (ref 10–291)

## 2011-03-06 LAB — PTH, INTACT AND CALCIUM: Calcium, Total (PTH): 9.4 mg/dL (ref 8.4–10.5)

## 2011-03-17 ENCOUNTER — Other Ambulatory Visit: Payer: Self-pay | Admitting: Nephrology

## 2011-03-17 ENCOUNTER — Encounter (HOSPITAL_COMMUNITY)
Admission: RE | Admit: 2011-03-17 | Discharge: 2011-03-17 | Payer: Medicare Other | Source: Ambulatory Visit | Attending: Nephrology | Admitting: Nephrology

## 2011-03-17 LAB — POCT HEMOGLOBIN-HEMACUE: Hemoglobin: 11.5 g/dL — ABNORMAL LOW (ref 12.0–15.0)

## 2011-03-20 LAB — POCT HEMOGLOBIN-HEMACUE: Hemoglobin: 10.6 g/dL — ABNORMAL LOW (ref 12.0–15.0)

## 2011-03-31 ENCOUNTER — Other Ambulatory Visit: Payer: Self-pay | Admitting: Nephrology

## 2011-03-31 ENCOUNTER — Encounter (HOSPITAL_COMMUNITY): Payer: Medicare Other | Attending: Nephrology

## 2011-03-31 DIAGNOSIS — D638 Anemia in other chronic diseases classified elsewhere: Secondary | ICD-10-CM | POA: Insufficient documentation

## 2011-03-31 DIAGNOSIS — N184 Chronic kidney disease, stage 4 (severe): Secondary | ICD-10-CM | POA: Insufficient documentation

## 2011-03-31 LAB — RENAL FUNCTION PANEL
Albumin: 3.3 g/dL — ABNORMAL LOW (ref 3.5–5.2)
CO2: 22 mEq/L (ref 19–32)
Chloride: 97 mEq/L (ref 96–112)
Creatinine, Ser: 2.97 mg/dL — ABNORMAL HIGH (ref 0.50–1.10)
GFR calc Af Amer: 19 mL/min — ABNORMAL LOW (ref >60)
GFR calc non Af Amer: 16 mL/min — ABNORMAL LOW (ref >60)
Potassium: 4.6 mEq/L (ref 3.5–5.1)

## 2011-03-31 LAB — POCT HEMOGLOBIN-HEMACUE: Hemoglobin: 11.8 g/dL — ABNORMAL LOW (ref 12.0–15.0)

## 2011-03-31 LAB — IRON AND TIBC
Iron: 61 ug/dL (ref 42–135)
TIBC: 187 ug/dL — ABNORMAL LOW (ref 250–470)

## 2011-04-14 ENCOUNTER — Encounter (HOSPITAL_COMMUNITY): Payer: Medicare Other

## 2011-04-14 ENCOUNTER — Other Ambulatory Visit: Payer: Self-pay | Admitting: Nephrology

## 2011-04-14 LAB — POCT HEMOGLOBIN-HEMACUE: Hemoglobin: 11.4 g/dL — ABNORMAL LOW (ref 12.0–15.0)

## 2011-04-28 ENCOUNTER — Other Ambulatory Visit: Payer: Self-pay | Admitting: Nephrology

## 2011-04-28 ENCOUNTER — Encounter (HOSPITAL_COMMUNITY): Payer: Medicare Other | Attending: Nephrology

## 2011-04-28 DIAGNOSIS — D638 Anemia in other chronic diseases classified elsewhere: Secondary | ICD-10-CM | POA: Insufficient documentation

## 2011-04-28 DIAGNOSIS — N184 Chronic kidney disease, stage 4 (severe): Secondary | ICD-10-CM | POA: Insufficient documentation

## 2011-04-28 LAB — RENAL FUNCTION PANEL
Albumin: 3.4 g/dL — ABNORMAL LOW (ref 3.5–5.2)
CO2: 26 mEq/L (ref 19–32)
Calcium: 9.9 mg/dL (ref 8.4–10.5)
Chloride: 99 mEq/L (ref 96–112)
Creatinine, Ser: 2.69 mg/dL — ABNORMAL HIGH (ref 0.50–1.10)
GFR calc Af Amer: 22 mL/min — ABNORMAL LOW (ref >60)
GFR calc non Af Amer: 18 mL/min — ABNORMAL LOW (ref >60)
Sodium: 135 mEq/L (ref 135–145)

## 2011-04-28 LAB — POCT HEMOGLOBIN-HEMACUE: Hemoglobin: 12.3 g/dL (ref 12.0–15.0)

## 2011-04-28 LAB — IRON AND TIBC: TIBC: 200 ug/dL — ABNORMAL LOW (ref 250–470)

## 2011-05-01 LAB — PTH, INTACT AND CALCIUM
Calcium, Total (PTH): 9.4 mg/dL (ref 8.4–10.5)
PTH: 115.2 pg/mL — ABNORMAL HIGH (ref 14.0–72.0)

## 2011-05-12 ENCOUNTER — Encounter (HOSPITAL_COMMUNITY): Payer: Medicare Other

## 2011-05-12 ENCOUNTER — Other Ambulatory Visit: Payer: Self-pay | Admitting: Nephrology

## 2011-05-16 LAB — POCT HEMOGLOBIN-HEMACUE: Hemoglobin: 11.1 g/dL — ABNORMAL LOW (ref 12.0–15.0)

## 2011-05-25 ENCOUNTER — Encounter (HOSPITAL_COMMUNITY): Payer: Medicare Other

## 2011-05-30 ENCOUNTER — Other Ambulatory Visit: Payer: Self-pay | Admitting: Nephrology

## 2011-05-30 ENCOUNTER — Encounter (HOSPITAL_COMMUNITY): Payer: Medicare Other | Attending: Nephrology

## 2011-05-30 DIAGNOSIS — N184 Chronic kidney disease, stage 4 (severe): Secondary | ICD-10-CM | POA: Insufficient documentation

## 2011-05-30 DIAGNOSIS — D638 Anemia in other chronic diseases classified elsewhere: Secondary | ICD-10-CM | POA: Insufficient documentation

## 2011-05-30 LAB — RENAL FUNCTION PANEL
BUN: 41 mg/dL — ABNORMAL HIGH (ref 6–23)
CO2: 25 mEq/L (ref 19–32)
Chloride: 99 mEq/L (ref 96–112)
Creatinine, Ser: 3.46 mg/dL — ABNORMAL HIGH (ref 0.50–1.10)
GFR calc non Af Amer: 13 mL/min — ABNORMAL LOW (ref >60)
Potassium: 4.3 mEq/L (ref 3.5–5.1)

## 2011-05-30 LAB — IRON AND TIBC
Iron: 61 ug/dL (ref 42–135)
Saturation Ratios: 31 % (ref 20–55)
TIBC: 199 ug/dL — ABNORMAL LOW (ref 250–470)

## 2011-05-30 LAB — FERRITIN: Ferritin: 484 ng/mL — ABNORMAL HIGH (ref 10–291)

## 2011-05-30 LAB — POCT HEMOGLOBIN-HEMACUE: Hemoglobin: 11 g/dL — ABNORMAL LOW (ref 12.0–15.0)

## 2011-05-31 LAB — PTH, INTACT AND CALCIUM: PTH: 183.6 pg/mL — ABNORMAL HIGH (ref 14.0–72.0)

## 2011-06-15 LAB — CBC
Hemoglobin: 10.6 — ABNORMAL LOW
MCHC: 32.8
MCV: 86.7
RBC: 3.75 — ABNORMAL LOW
RDW: 16.2 — ABNORMAL HIGH

## 2011-06-15 LAB — FERRITIN: Ferritin: 356 — ABNORMAL HIGH (ref 10–291)

## 2011-06-15 LAB — IRON AND TIBC: TIBC: 207 — ABNORMAL LOW

## 2011-06-16 ENCOUNTER — Other Ambulatory Visit: Payer: Self-pay | Admitting: Nephrology

## 2011-06-16 ENCOUNTER — Encounter (HOSPITAL_COMMUNITY): Payer: Medicare Other

## 2011-06-16 LAB — HEMOGLOBIN AND HEMATOCRIT, BLOOD
HCT: 36.2
Hemoglobin: 11.8 — ABNORMAL LOW

## 2011-06-16 LAB — RENAL FUNCTION PANEL
BUN: 40 — ABNORMAL HIGH
CO2: 20
Calcium: 9.1
Creatinine, Ser: 3.07 — ABNORMAL HIGH
GFR calc Af Amer: 19 — ABNORMAL LOW
Glucose, Bld: 169 — ABNORMAL HIGH

## 2011-06-19 LAB — BASIC METABOLIC PANEL
BUN: 34 — ABNORMAL HIGH
CO2: 26
Calcium: 9.7
Chloride: 105
Creatinine, Ser: 2.85 — ABNORMAL HIGH
GFR calc Af Amer: 19 — ABNORMAL LOW
GFR calc non Af Amer: 15 — ABNORMAL LOW
Glucose, Bld: 108 — ABNORMAL HIGH
Sodium: 138

## 2011-06-19 LAB — CBC
HCT: 28.8 — ABNORMAL LOW
Hemoglobin: 12.3
Hemoglobin: 9.6 — ABNORMAL LOW
Platelets: 168
RBC: 4.35
RDW: 16 — ABNORMAL HIGH
WBC: 7.3

## 2011-06-19 LAB — IRON AND TIBC
Saturation Ratios: 26
TIBC: 217 — ABNORMAL LOW
UIBC: 160

## 2011-06-19 LAB — HEMOGLOBIN A1C: Mean Plasma Glucose: 197

## 2011-06-20 LAB — PTH, INTACT AND CALCIUM: PTH: 93.3 — ABNORMAL HIGH

## 2011-06-20 LAB — RENAL FUNCTION PANEL
BUN: 47 — ABNORMAL HIGH
CO2: 22
Chloride: 108
Glucose, Bld: 191 — ABNORMAL HIGH
Phosphorus: 4.6
Potassium: 4.9

## 2011-06-20 LAB — IRON AND TIBC
Iron: 44
TIBC: 217 — ABNORMAL LOW

## 2011-06-20 LAB — CBC
MCHC: 33.4
Platelets: 183
RBC: 3.07 — ABNORMAL LOW

## 2011-06-20 LAB — HEMOGLOBIN AND HEMATOCRIT, BLOOD
HCT: 27.2 — ABNORMAL LOW
Hemoglobin: 9.1 — ABNORMAL LOW

## 2011-06-21 LAB — IRON AND TIBC
Iron: 35 — ABNORMAL LOW
Saturation Ratios: 17 — ABNORMAL LOW
TIBC: 206 — ABNORMAL LOW
UIBC: 171

## 2011-06-21 LAB — RENAL FUNCTION PANEL
CO2: 21
Calcium: 9.1
Chloride: 107
Creatinine, Ser: 3.05 — ABNORMAL HIGH
Glucose, Bld: 204 — ABNORMAL HIGH

## 2011-06-22 LAB — CBC
HCT: 41.7
Hemoglobin: 14.4
MCHC: 33.5
MCV: 87.3
MCV: 85.8
Platelets: 208
RBC: 4.93
RDW: 15.7 — ABNORMAL HIGH
WBC: 7.5
WBC: 7.9

## 2011-06-22 LAB — RENAL FUNCTION PANEL
Albumin: 3.4 — ABNORMAL LOW
BUN: 38 — ABNORMAL HIGH
CO2: 24
Calcium: 9.4
Calcium: 9.5
Chloride: 105
Creatinine, Ser: 3.74 — ABNORMAL HIGH
Creatinine, Ser: 3.15 — ABNORMAL HIGH
GFR calc Af Amer: 15 — ABNORMAL LOW
GFR calc Af Amer: 18 — ABNORMAL LOW
GFR calc non Af Amer: 15 — ABNORMAL LOW
Glucose, Bld: 207 — ABNORMAL HIGH
Phosphorus: 4.1
Phosphorus: 3.9
Sodium: 137

## 2011-06-22 LAB — PTH, INTACT AND CALCIUM: PTH: 99 — ABNORMAL HIGH

## 2011-06-22 LAB — IRON AND TIBC
TIBC: 201 — ABNORMAL LOW
UIBC: 162

## 2011-06-22 LAB — FERRITIN: Ferritin: 249 (ref 10–291)

## 2011-06-26 LAB — RENAL FUNCTION PANEL
Albumin: 3.6
BUN: 74 — ABNORMAL HIGH
Chloride: 107
Chloride: 101
Glucose, Bld: 85
Glucose, Bld: 107 — ABNORMAL HIGH
Phosphorus: 3.6
Potassium: 4.6
Potassium: 3.9
Sodium: 138

## 2011-06-26 LAB — PTH, INTACT AND CALCIUM: PTH: 42.3

## 2011-06-26 LAB — IRON AND TIBC
Iron: 36 — ABNORMAL LOW
Saturation Ratios: 16 — ABNORMAL LOW
UIBC: 195

## 2011-06-26 LAB — CBC
HCT: 24.2 — ABNORMAL LOW
Hemoglobin: 8.3 — ABNORMAL LOW
MCHC: 34.2
MCV: 90.3
RBC: 2.68 — ABNORMAL LOW

## 2011-06-26 LAB — POCT HEMOGLOBIN-HEMACUE
Hemoglobin: 8.4 — ABNORMAL LOW
Hemoglobin: 9 — ABNORMAL LOW

## 2011-06-26 LAB — GLUCOSE, CAPILLARY

## 2011-06-26 LAB — POCT I-STAT 4, (NA,K, GLUC, HGB,HCT)
HCT: 26 — ABNORMAL LOW
Potassium: 4.4
Sodium: 136

## 2011-06-26 LAB — FERRITIN: Ferritin: 376 — ABNORMAL HIGH (ref 10–291)

## 2011-06-27 LAB — FERRITIN: Ferritin: 362 — ABNORMAL HIGH (ref 10–291)

## 2011-06-27 LAB — IRON AND TIBC
Saturation Ratios: 22
TIBC: 185 — ABNORMAL LOW
UIBC: 145

## 2011-06-27 LAB — POCT HEMOGLOBIN-HEMACUE: Hemoglobin: 11.3 — ABNORMAL LOW

## 2011-06-27 LAB — HEMOGLOBIN AND HEMATOCRIT, BLOOD: Hemoglobin: 11.2 — ABNORMAL LOW

## 2011-06-28 ENCOUNTER — Other Ambulatory Visit: Payer: Self-pay | Admitting: Nephrology

## 2011-06-28 ENCOUNTER — Encounter (HOSPITAL_COMMUNITY)
Admission: RE | Admit: 2011-06-28 | Discharge: 2011-06-28 | Disposition: A | Discharge status: Home / Self Care | Payer: Medicare Other | Source: Ambulatory Visit | Attending: Nephrology | Admitting: Nephrology

## 2011-06-28 DIAGNOSIS — D638 Anemia in other chronic diseases classified elsewhere: Secondary | ICD-10-CM | POA: Insufficient documentation

## 2011-06-28 DIAGNOSIS — N184 Chronic kidney disease, stage 4 (severe): Secondary | ICD-10-CM | POA: Insufficient documentation

## 2011-06-28 LAB — RENAL FUNCTION PANEL
Albumin: 3.9 g/dL (ref 3.5–5.2)
BUN: 60 mg/dL — ABNORMAL HIGH (ref 6–23)
Chloride: 100 mEq/L (ref 96–112)
Glucose, Bld: 71 mg/dL (ref 70–99)
Potassium: 3.9 mEq/L (ref 3.5–5.1)

## 2011-06-28 LAB — IRON AND TIBC
Iron: 56 ug/dL (ref 42–135)
TIBC: 203 ug/dL — ABNORMAL LOW (ref 250–470)

## 2011-06-28 LAB — FERRITIN: Ferritin: 558 ng/mL — ABNORMAL HIGH (ref 10–291)

## 2011-06-29 ENCOUNTER — Encounter (HOSPITAL_COMMUNITY): Payer: Medicare Other

## 2011-06-30 LAB — PTH, INTACT AND CALCIUM
Calcium, Total (PTH): 9.6 mg/dL (ref 8.4–10.5)
PTH: 104 pg/mL — ABNORMAL HIGH (ref 14.0–72.0)

## 2011-06-30 LAB — RENAL FUNCTION PANEL
Albumin: 3.5 g/dL (ref 3.5–5.2)
BUN: 66 mg/dL — ABNORMAL HIGH (ref 6–23)
Calcium: 9.6 mg/dL (ref 8.4–10.5)
Creatinine, Ser: 4.17 mg/dL — ABNORMAL HIGH (ref 0.4–1.2)
Phosphorus: 5.2 mg/dL — ABNORMAL HIGH (ref 2.3–4.6)

## 2011-06-30 LAB — FERRITIN: Ferritin: 533 ng/mL — ABNORMAL HIGH (ref 10–291)

## 2011-06-30 LAB — IRON AND TIBC
Iron: 78 ug/dL (ref 42–135)
TIBC: 200 ug/dL — ABNORMAL LOW (ref 250–470)
UIBC: 122 ug/dL

## 2011-07-03 LAB — RENAL FUNCTION PANEL
BUN: 54 — ABNORMAL HIGH
CO2: 25
Calcium: 9.5
Glucose, Bld: 189 — ABNORMAL HIGH
Phosphorus: 4.1
Potassium: 4.5

## 2011-07-03 LAB — CBC
HCT: 27.3 — ABNORMAL LOW
MCHC: 33.6
MCV: 85.1
Platelets: 149 — ABNORMAL LOW

## 2011-07-03 LAB — PTH, INTACT AND CALCIUM: PTH: 62.2

## 2011-07-04 LAB — CBC
HCT: 28.8 — ABNORMAL LOW
MCHC: 32.6
MCV: 86.6
RBC: 3.32 — ABNORMAL LOW

## 2011-07-05 LAB — COMPREHENSIVE METABOLIC PANEL
Albumin: 3.8
Alkaline Phosphatase: 76
BUN: 45 — ABNORMAL HIGH
CO2: 23
Chloride: 109
Creatinine, Ser: 2.78 — ABNORMAL HIGH
GFR calc non Af Amer: 17 — ABNORMAL LOW
Glucose, Bld: 104 — ABNORMAL HIGH
Potassium: 4.7
Total Bilirubin: 0.8

## 2011-07-05 LAB — BASIC METABOLIC PANEL
BUN: 43 — ABNORMAL HIGH
CO2: 23
Calcium: 9.4
Chloride: 105
GFR calc Af Amer: 22 — ABNORMAL LOW
GFR calc non Af Amer: 17 — ABNORMAL LOW
Glucose, Bld: 161 — ABNORMAL HIGH
Glucose, Bld: 290 — ABNORMAL HIGH
Potassium: 4.3
Potassium: 4.6
Sodium: 132 — ABNORMAL LOW
Sodium: 136

## 2011-07-05 LAB — CBC
HCT: 24.5 — ABNORMAL LOW
HCT: 32.4 — ABNORMAL LOW
Hemoglobin: 10.8 — ABNORMAL LOW
Hemoglobin: 8.1 — ABNORMAL LOW
MCV: 86.8
MCV: 87.3
Platelets: 224
WBC: 6.3
WBC: 8.3

## 2011-07-05 LAB — URINALYSIS, ROUTINE W REFLEX MICROSCOPIC
Bilirubin Urine: NEGATIVE
Ketones, ur: NEGATIVE
Nitrite: NEGATIVE
Protein, ur: 100 — AB
Specific Gravity, Urine: 1.01
Urobilinogen, UA: 0.2

## 2011-07-05 LAB — ABO/RH: ABO/RH(D): AB NEG

## 2011-07-05 LAB — PROTIME-INR
INR: 1.1
Prothrombin Time: 14.2

## 2011-07-05 LAB — TYPE AND SCREEN: ABO/RH(D): AB NEG

## 2011-07-05 LAB — URINE MICROSCOPIC-ADD ON

## 2011-07-05 LAB — APTT: aPTT: 28

## 2011-07-06 LAB — RENAL FUNCTION PANEL
Albumin: 3.4 — ABNORMAL LOW
Chloride: 107
GFR calc Af Amer: 25 — ABNORMAL LOW
GFR calc non Af Amer: 20 — ABNORMAL LOW
Phosphorus: 4.7 — ABNORMAL HIGH
Potassium: 4
Sodium: 140

## 2011-07-06 LAB — CBC
HCT: 31.4 — ABNORMAL LOW
Hemoglobin: 10.4 — ABNORMAL LOW
MCHC: 33.1
Platelets: 288
RDW: 15.3 — ABNORMAL HIGH

## 2011-07-06 LAB — PTH, INTACT AND CALCIUM
Calcium, Total (PTH): 9.6
PTH: 41.8

## 2011-07-07 LAB — CBC
HCT: 26.1 — ABNORMAL LOW
Hemoglobin: 8.8 — ABNORMAL LOW
MCHC: 33.8
MCV: 89.4
RBC: 3.16 — ABNORMAL LOW
RBC: 2.92 — ABNORMAL LOW
WBC: 5.9
WBC: 4.5

## 2011-07-10 LAB — CBC
HCT: 33.6 — ABNORMAL LOW
Hemoglobin: 11.4 — ABNORMAL LOW
Platelets: 204
RBC: 3.92
WBC: 5.5

## 2011-07-10 LAB — RENAL FUNCTION PANEL
Albumin: 3.6
BUN: 55 — ABNORMAL HIGH
CO2: 31
Chloride: 100
Creatinine, Ser: 2.87 — ABNORMAL HIGH

## 2011-07-10 LAB — PTH, INTACT AND CALCIUM: Calcium, Total (PTH): 10.1

## 2011-07-10 LAB — IRON AND TIBC
Saturation Ratios: 47
UIBC: 124

## 2011-07-11 ENCOUNTER — Ambulatory Visit (INDEPENDENT_AMBULATORY_CARE_PROVIDER_SITE_OTHER): Payer: Medicare Other | Admitting: Cardiovascular Disease

## 2011-07-11 ENCOUNTER — Encounter: Payer: Self-pay | Admitting: Cardiovascular Disease

## 2011-07-11 DIAGNOSIS — I2510 Atherosclerotic heart disease of native coronary artery without angina pectoris: Secondary | ICD-10-CM

## 2011-07-11 DIAGNOSIS — I739 Peripheral vascular disease, unspecified: Secondary | ICD-10-CM

## 2011-07-11 DIAGNOSIS — I10 Essential (primary) hypertension: Secondary | ICD-10-CM

## 2011-07-11 NOTE — Patient Instructions (Signed)
Your physician wants you to follow-up in: 1 YEAR.  You will receive a reminder letter in the mail two months in advance. If you don't receive a letter, please call our office to schedule the follow-up appointment.  Your physician recommends that you continue on your current medications as directed. Please refer to the Current Medication list given to you today.  

## 2011-07-13 LAB — CBC
HCT: 34.9 — ABNORMAL LOW
Hemoglobin: 11.9 — ABNORMAL LOW
MCHC: 34.2
RDW: 14

## 2011-07-14 ENCOUNTER — Encounter (HOSPITAL_COMMUNITY): Payer: Medicare Other

## 2011-07-14 ENCOUNTER — Other Ambulatory Visit: Payer: Self-pay | Admitting: Nephrology

## 2011-07-17 ENCOUNTER — Telehealth: Payer: Self-pay | Admitting: *Deleted

## 2011-07-17 NOTE — Telephone Encounter (Signed)
This pt just saw Dr Excell Seltzer on 07/11/11.  I will forward message to Dr Excell Seltzer to address surgical clearance.

## 2011-07-17 NOTE — Telephone Encounter (Signed)
PT CALLED OFFICE NEEDS ROTATOR CUFF  REPAIR  WILL FORWARD TO DR Excell Seltzer AND Julieta Gutting RN FOR REVIEW.  SX NOT  SCHEDULED AT THIS TIME. PLEASE FAX CLEARANCE TO [facility] AT [phone] ./CY

## 2011-07-25 ENCOUNTER — Encounter: Payer: Self-pay | Admitting: Vascular Surgery

## 2011-07-26 ENCOUNTER — Encounter: Payer: Self-pay | Admitting: Vascular Surgery

## 2011-07-26 NOTE — Assessment & Plan Note (Signed)
The patient has diffuse multivessel coronary artery disease. She is on appropriate risk reduction medications including aspirin, Plavix, and pravastatin.  She has advanced kidney disease and is not a candidate for an ACE inhibitor. Will continue with her medical therapy.

## 2011-07-26 NOTE — Telephone Encounter (Signed)
I reviewed the patient's cardiac history in detail. Her last cardiac catheterization was in 2006 and she has severe three-vessel coronary artery disease managed medically because of poor surgical targets. She will need a nuclear stress scan prior to surgery. Her risk of cardiac problems related to surgery is significant, regardless of her stress test results. I would recommend a Lexiscan Myoview stress test to help with risk stratification

## 2011-07-26 NOTE — Assessment & Plan Note (Signed)
Well controlled on a combination of hydralazine, metoprolol, and torsemide.

## 2011-07-26 NOTE — Progress Notes (Signed)
HPI:  This is a 65 year old woman presenting for followup evaluation. She has coronary artery disease and peripheral arterial disease. She has undergone previous left carotid endarterectomy. She has developed an ulcer on the left foot and is scheduled to see Dr. Darrick Penna for evaluation. She notes that the area on her foot is improving.  ABIs last year were 68% on the right and 57% on the left. These were stable values from her previous studies. The patient has poor mobility related to weakness and balance problems. She ambulates with a walker. She denies calf pain with ambulation.  The patient's last cardiac catheterization was in 2006 when she was diagnosed with severe diffuse three-vessel coronary artery disease. Her coronary disease distribution was in the distal vessel diabetic pattern and she was felt to have very poor targets for surgical revascularization. She therefore was managed medically.  She denies exertional chest pain or pressure. She has chronic dyspnea unchanged over time. She denies lightheadedness, palpitations, or syncope.  Outpatient Encounter Prescriptions as of 07/11/2011  Medication Sig Dispense Refill  . aspirin (ASPIRIN LOW DOSE) 81 MG EC tablet Take 81 mg by mouth daily.        . Blood Glucose Monitoring Suppl (PRODIGY AUTOCODE BLOOD GLUCOSE) W/DEVICE KIT by Does not apply route 3 (three) times daily.        . calcitRIOL (ROCALTROL) 0.25 MCG capsule Take 0.25 mcg by mouth daily.        . clopidogrel (PLAVIX) 75 MG tablet Take 75 mg by mouth daily.        . Foot Care Products [facility] FOR HER OPEN SHOE) MISC by Does not apply route. Patient has Rx for diabetic shoes         . gabapentin (NEURONTIN) 100 MG tablet Take 100 mg by mouth 3 (three) times daily.        . Glucose Blood KIT by In Vitro route.        Marland Kitchen glucose blood test strip 1 each by Other route 3 (three) times daily. Use as instructed       . hydrALAZINE (APRESOLINE) 50 MG tablet Take 50 mg by mouth 3 (three) times  daily.        . insulin aspart (NOVOLOG) 100 UNIT/ML injection Inject into the skin 3 (three) times daily before meals. Sliding scale         . insulin glargine (LANTUS) 100 UNIT/ML injection Inject into the skin 2 (two) times daily. Inject 32 units in the am and 28 units in the pm daily         . Lancets MISC by Does not apply route 3 (three) times daily.        . metoprolol tartrate (LOPRESSOR) 25 MG tablet Take 25 mg by mouth 2 (two) times daily.        . pantoprazole (PROTONIX) 40 MG tablet Take 40 mg by mouth daily.        . potassium chloride (KLOR-CON) 20 MEQ packet Take 20 mEq by mouth daily.       . pravastatin (PRAVACHOL) 40 MG tablet Take 40 mg by mouth daily.        Marland Kitchen torsemide (DEMADEX) 100 MG tablet Take 100 mg by mouth 2 (two) times daily.       Marland Kitchen DISCONTD: Alcohol Swabs PADS by Does not apply route 3 (three) times daily.          Allergies  Allergen Reactions  . Codeine     Past Medical History  Diagnosis Date  . CKD (chronic kidney disease)     severe  . IDDM (insulin dependent diabetes mellitus)   . CAD (coronary artery disease)   . Neuropathy   . Ulcer of foot   . Hypertension   . DJD (degenerative joint disease)   . Acute HF (heart failure)   . Cataracts, bilateral 07/15/10  . URI (upper respiratory infection)   . Hyperlipidemia   . Chronic kidney disease   . Diabetes mellitus   . GERD (gastroesophageal reflux disease)   . Hiatal hernia   . Heart murmur   . Joint pain   . Dizziness   . CHF (congestive heart failure)   . Myocardial infarction   . Carotid artery occlusion     ROS: Negative except as per HPI  BP 110/60  Pulse 60  Resp 18  Ht 5\' 6"  (1.676 m)  Wt 169 lb (76.658 kg)  BMI 27.28 kg/m2  PHYSICAL EXAM: Pt is alert and oriented, NAD HEENT: normal Neck: JVP - normal, carotids 2+= without bruits.  Lungs: CTA bilaterally CV: RRR without murmur or gallop Abd: soft, NT, Positive BS, no hepatomegaly Ext: no C/C/E, pedal pulses are  nonpalpable Skin: there is a superficial ulcer at base of the first toe on the dorsum of the foot without surrounding erythema or tenderness to palpation.  ASSESSMENT AND PLAN:

## 2011-07-26 NOTE — Assessment & Plan Note (Signed)
Patient has moderate reduction in bilateral ABIs. She does not have symptomatic limitation but has developed a foot ulcer. She has upcoming evaluation with Dr. Darrick Penna.

## 2011-07-27 ENCOUNTER — Ambulatory Visit (INDEPENDENT_AMBULATORY_CARE_PROVIDER_SITE_OTHER): Payer: Medicare Other | Admitting: Vascular Surgery

## 2011-07-27 ENCOUNTER — Telehealth: Payer: Self-pay | Admitting: Cardiovascular Disease

## 2011-07-27 ENCOUNTER — Encounter (HOSPITAL_COMMUNITY): Payer: Medicare Other

## 2011-07-27 ENCOUNTER — Other Ambulatory Visit: Payer: Self-pay | Admitting: Nephrology

## 2011-07-27 ENCOUNTER — Encounter: Payer: Self-pay | Admitting: Vascular Surgery

## 2011-07-27 VITALS — BP 141/65 | HR 57 | Resp 16 | Ht 66.0 in | Wt 175.5 lb

## 2011-07-27 DIAGNOSIS — L97409 Non-pressure chronic ulcer of unspecified heel and midfoot with unspecified severity: Secondary | ICD-10-CM

## 2011-07-27 DIAGNOSIS — L97509 Non-pressure chronic ulcer of other part of unspecified foot with unspecified severity: Secondary | ICD-10-CM

## 2011-07-27 DIAGNOSIS — D638 Anemia in other chronic diseases classified elsewhere: Secondary | ICD-10-CM | POA: Insufficient documentation

## 2011-07-27 DIAGNOSIS — N184 Chronic kidney disease, stage 4 (severe): Secondary | ICD-10-CM | POA: Insufficient documentation

## 2011-07-27 LAB — IRON AND TIBC
Saturation Ratios: 34 % (ref 20–55)
TIBC: 209 ug/dL — ABNORMAL LOW (ref 250–470)

## 2011-07-27 LAB — RENAL FUNCTION PANEL
BUN: 54 mg/dL — ABNORMAL HIGH (ref 6–23)
CO2: 24 mEq/L (ref 19–32)
Calcium: 10.2 mg/dL (ref 8.4–10.5)
Creatinine, Ser: 4.41 mg/dL — ABNORMAL HIGH (ref 0.50–1.10)
Glucose, Bld: 131 mg/dL — ABNORMAL HIGH (ref 70–99)

## 2011-07-27 NOTE — Telephone Encounter (Signed)
I left a message for the pt to call our office.

## 2011-07-27 NOTE — Progress Notes (Signed)
[facility] [place] HISTORY AND PHYSICAL   History of Present Illness:  Patient is a 65 y.o. year old female who presents for follow-up evaluation for PAD. She developed a non healing ulcer on her right foot about 6 weeks ago.  She has been followed by Dr. Tanda Rockers at the [facility] and this has been slowly improving.  However she did have an abnormal arterial non-invasive scan.  Her atherosclerotic risk factors remain diabetes, elevated cholesterol, hypertension, renal dysfunction, neuropathy, and coronary artery disease.  These are all currently stable and followed by the primary care physician.  The patient currently has no claudication symptoms.  The patient denies rest pain but does have numbness from her neuropathy.  Past Medical History  Diagnosis Date  . CKD (chronic kidney disease)     severe  . IDDM (insulin dependent diabetes mellitus)   . CAD (coronary artery disease)   . Neuropathy   . Ulcer of foot   . Hypertension   . DJD (degenerative joint disease)   . Acute HF (heart failure)   . Cataracts, bilateral 07/15/10  . URI (upper respiratory infection)   . Hyperlipidemia   . Chronic kidney disease   . Diabetes mellitus   . GERD (gastroesophageal reflux disease)   . Hiatal hernia   . Heart murmur   . Joint pain   . Dizziness   . CHF (congestive heart failure)   . Myocardial infarction   . Carotid artery occlusion     Past Surgical History  Procedure Date  . Cataract extraction   . Arteriovenous graft placement 2009  . Carotid endarterectomy 2008    Review of Systems:  Neurologic: sensation in the feet is decreased Cardiac:denies shortness of breath or chest pain Pulmonary: denies cough or wheeze  Social History History  Substance Use Topics  . Smoking status: Former Smoker    Quit date: 09/26/1967  . Smokeless tobacco: Not on file  . Alcohol Use: No    Allergies  Allergies  Allergen Reactions  . Codeine       Current Outpatient Prescriptions  Medication Sig Dispense Refill  . aspirin (ASPIRIN LOW DOSE) 81 MG EC tablet Take 81 mg by mouth daily.        . Blood Glucose Monitoring Suppl (PRODIGY AUTOCODE BLOOD GLUCOSE) W/DEVICE KIT by Does not apply route 3 (three) times daily.        . calcitRIOL (ROCALTROL) 0.25 MCG capsule Take 0.25 mcg by mouth daily.        . clopidogrel (PLAVIX) 75 MG tablet Take 75 mg by mouth daily.        . famotidine (PEPCID) 40 MG tablet Take 40 mg by mouth daily.        . Foot Care Products [facility] FOR HER OPEN SHOE) MISC by Does not apply route. Patient has Rx for diabetic shoes         . Furosemide (LASIX PO) Take 100 mg by mouth 2 (two) times daily.        Marland Kitchen gabapentin (NEURONTIN) 100 MG tablet Take 100 mg by mouth 3 (three) times daily.        . Glucose Blood KIT by In Vitro route.        Marland Kitchen glucose blood test strip 1 each by Other route 3 (three) times daily. Use as instructed       . hydrALAZINE (APRESOLINE) 50 MG tablet Take 50 mg by mouth 3 (three) times daily.        Marland Kitchen  insulin aspart (NOVOLOG) 100 UNIT/ML injection Inject into the skin 3 (three) times daily before meals. Sliding scale         . insulin glargine (LANTUS) 100 UNIT/ML injection Inject into the skin 2 (two) times daily. Inject 32 units in the am and 28 units in the pm daily         . Lancets MISC by Does not apply route 3 (three) times daily.        . metoprolol tartrate (LOPRESSOR) 25 MG tablet Take 25 mg by mouth 2 (two) times daily.        . pantoprazole (PROTONIX) 40 MG tablet Take 40 mg by mouth daily.        . potassium chloride (KLOR-CON) 20 MEQ packet Take 20 mEq by mouth daily.       . pravastatin (PRAVACHOL) 40 MG tablet Take 40 mg by mouth daily.        Marland Kitchen torsemide (DEMADEX) 100 MG tablet Take 100 mg by mouth 2 (two) times daily.         Physical Examination  Filed Vitals:   07/27/11 1312  BP: 141/65  Pulse: 57  Resp: 16  Height: 5\' 6"  (1.676 m)  Weight: 175 lb 8 oz  (79.606 kg)    Body mass index is 28.33 kg/(m^2).  General:  Alert and oriented, no acute distress HEENT: Normal Neck: No bruit or JVD, well healed carotid scar left side Pulmonary: Clear to auscultation bilaterally Cardiac: Regular Rate and Rhythm without murmur Neurologic: Upper and lower extremity motor 5/5 and symmetric Extremities:  2+ femoral right, otherwise left femoral and bilat pop pedal pulses non palpable Skin: right base of first toe with some erythema and an almost healed 3 x 1 cm ulcer  DATA:  Noninvasive arterial study dated 06/29/2011 from [facility] hospital was reviewed which is a toe pressure on the right side of 37 left was 84 vessels are most likely calcified bilaterally, she had monophasic waveforms bilaterally  ASSESSMENT: Slowly healing wound right foot, however, the patient states that the wound is improving currently. She does have evidence of arterial occlusive disease and most likely I would suspect that this is probably popliteal and tibial disease from her diabetes   PLAN: I agree with Dr. Tanda Rockers that she needs continued close followup. She is due to have a carotid duplex ultrasound 2 followup on her previous carotid endarterectomy. We will schedule her for a followup visit to recheck her left foot as well as a duplex exam in one month. If the wound on her left foot continues to deteriorate or worsen over time we would need to consider arteriogram for possible revascularization. However, if the wound continues to heal spontaneously we will continue observation at this point.  Fabienne Bruns, MD [facility] [place] Office: [phone] Pager: [phone]

## 2011-07-28 LAB — PTH, INTACT AND CALCIUM
Calcium, Total (PTH): 10.1 mg/dL (ref 8.4–10.5)
PTH: 184.5 pg/mL — ABNORMAL HIGH (ref 14.0–72.0)

## 2011-08-01 ENCOUNTER — Telehealth: Payer: Self-pay | Admitting: Cardiovascular Disease

## 2011-08-01 DIAGNOSIS — I2510 Atherosclerotic heart disease of native coronary artery without angina pectoris: Secondary | ICD-10-CM

## 2011-08-01 NOTE — Telephone Encounter (Signed)
The pt called back and is scheduled for Lexiscan Myoview on 08/08/11.  I will mail the pt instructions for this test.

## 2011-08-01 NOTE — Telephone Encounter (Signed)
Surgical clearance for shoulder surgery, pls fax [phone] dr stephil's office

## 2011-08-01 NOTE — Telephone Encounter (Signed)
Per Dr Excell Seltzer this pt needs a Lexiscan Myoview prior to addressing surgical clearance. Please see 07/17/11 telephone encounter with Dr Earmon Phoenix documentation.  Tonny Bollman, MD 07/26/2011 2:05 AM Signed  I reviewed the patient's cardiac history in detail. Her last cardiac catheterization was in 2006 and she has severe three-vessel coronary artery disease managed medically because of poor surgical targets. She will need a nuclear stress scan prior to surgery. Her risk of cardiac problems related to surgery is significant, regardless of her stress test results. I would recommend a Lexiscan Myoview stress test to help with risk stratification  I left a message for the pt to call the office to discuss having a Lexiscan Myoview.

## 2011-08-08 ENCOUNTER — Other Ambulatory Visit (HOSPITAL_COMMUNITY): Payer: Self-pay | Admitting: *Deleted

## 2011-08-08 ENCOUNTER — Ambulatory Visit (HOSPITAL_COMMUNITY): Payer: Medicare Other | Attending: Cardiology | Admitting: Radiology

## 2011-08-08 VITALS — Ht 66.0 in | Wt 174.0 lb

## 2011-08-08 DIAGNOSIS — E119 Type 2 diabetes mellitus without complications: Secondary | ICD-10-CM | POA: Insufficient documentation

## 2011-08-08 DIAGNOSIS — Z8679 Personal history of other diseases of the circulatory system: Secondary | ICD-10-CM | POA: Insufficient documentation

## 2011-08-08 DIAGNOSIS — Z01810 Encounter for preprocedural cardiovascular examination: Secondary | ICD-10-CM | POA: Insufficient documentation

## 2011-08-08 DIAGNOSIS — Z87891 Personal history of nicotine dependence: Secondary | ICD-10-CM | POA: Insufficient documentation

## 2011-08-08 DIAGNOSIS — I10 Essential (primary) hypertension: Secondary | ICD-10-CM | POA: Insufficient documentation

## 2011-08-08 DIAGNOSIS — I779 Disorder of arteries and arterioles, unspecified: Secondary | ICD-10-CM | POA: Insufficient documentation

## 2011-08-08 DIAGNOSIS — E785 Hyperlipidemia, unspecified: Secondary | ICD-10-CM | POA: Insufficient documentation

## 2011-08-08 DIAGNOSIS — I2510 Atherosclerotic heart disease of native coronary artery without angina pectoris: Secondary | ICD-10-CM

## 2011-08-08 DIAGNOSIS — Z794 Long term (current) use of insulin: Secondary | ICD-10-CM | POA: Insufficient documentation

## 2011-08-08 DIAGNOSIS — R0609 Other forms of dyspnea: Secondary | ICD-10-CM | POA: Insufficient documentation

## 2011-08-08 DIAGNOSIS — R0989 Other specified symptoms and signs involving the circulatory and respiratory systems: Secondary | ICD-10-CM

## 2011-08-08 DIAGNOSIS — I739 Peripheral vascular disease, unspecified: Secondary | ICD-10-CM | POA: Insufficient documentation

## 2011-08-08 MED ORDER — REGADENOSON 0.4 MG/5ML IV SOLN
0.4000 mg | Freq: Once | INTRAVENOUS | Status: AC
  Administered 2011-08-08: 0.4 mg via INTRAVENOUS

## 2011-08-08 MED ORDER — TECHNETIUM TC 99M TETROFOSMIN IV KIT
33.0000 | PACK | Freq: Once | INTRAVENOUS | Status: AC | PRN
  Administered 2011-08-08: 33 via INTRAVENOUS

## 2011-08-08 MED ORDER — TECHNETIUM TC 99M TETROFOSMIN IV KIT
11.0000 | PACK | Freq: Once | INTRAVENOUS | Status: AC | PRN
  Administered 2011-08-08: 11 via INTRAVENOUS

## 2011-08-08 NOTE — Progress Notes (Signed)
[name] is a 65 y.o. female [id number] [birth year]   Nuclear Med Background Indication for Stress Test:  Evaluation for Ischemia and Pending Clearance for (L) Shoulder Surgery by Dr. Francena Hanly History:  '03 MPS:No ischemia or scar, EF=69%; '06 NSTEMI>Cath:Severe 3V Dz, EF=55%, Mediical tx (poor candidate for CABG); '06 Echo:EF=55%; '08 (L) CEA Cardiac Risk Factors: Carotid Disease, CVA, History of Smoking, Hypertension, IDDM-Type 2, Lipids, PVD  Symptoms:  DOE   Nuclear Pre-Procedure Caffeine/Decaff Intake:  None NPO After: 10:00pm   Lungs:  Clear.  O2 SAT 99% on RA. IV 0.9% NS with Angio Cath:  22g  IV Site: R Antecubital  IV Started by:  Stanton Kidney, EMT-P  Chest Size (in):  38 Cup Size: C  Height: 5\' 6"  (1.676 m)  Weight:  174 lb (78.926 kg)  BMI:  Body mass index is 28.08 kg/(m^2). Tech Comments:  Metoprolol held this am; CBG=100 @ 6am, per patient.    Nuclear Med Study 1 or 2 day study: 1 day  Stress Test Type:  Lexiscan  Reading MD: Olga Millers, MD  Order Authorizing Provider:  Tonny Bollman, MD  Resting Radionuclide: Technetium 45m Tetrofosmin  Resting Radionuclide Dose: 11.0 mCi   Stress Radionuclide:  Technetium 24m Tetrofosmin  Stress Radionuclide Dose: 33.0 mCi           Stress Protocol Rest HR: 60 Stress HR: 71  Rest BP: 133/68 Stress BP: 140/71  Exercise Time (min): n/a METS: n/a   Predicted Max HR: 156 bpm % Max HR: 45.51 bpm Rate Pressure Product: 9940   Dose of Adenosine (mg):  n/a Dose of Lexiscan: 0.4 mg  Dose of Atropine (mg): n/a Dose of Dobutamine: n/a mcg/kg/min (at max HR)  Stress Test Technologist: Smiley Houseman, CMA-N  Nuclear Technologist:  Doyne Keel, CNMT     Rest Procedure:  Myocardial perfusion imaging was performed at rest 45 minutes following the intravenous administration of Technetium 54m  Tetrofosmin.  Rest ECG: Nonspecific ST-T wave changes and ? prior ASWMI.  Stress Procedure:  The patient received IV Lexiscan 0.4 mg over 15-seconds.  Technetium 74m Tetrofosmin injected at 30-seconds.  There were no significant changes with Lexiscan.  She did have a hypotensive response to infusion.  Quantitative spect images were obtained after a 45 minute delay.  Stress ECG: No significant ST segment change suggestive of ischemia.  QPS Raw Data Images:  Acquisition technically good; mild LVE. Stress Images:  There is decreased uptake in the distal anterior wall, distal inferior wall and apex. Rest Images:  There is decreased uptake in the distal anterior wall, distal inferior wall and apex, less prominent compared to the stress images. Subtraction (SDS):  These findings are consistent with prior infarct and mild to moderate peri-infarct ischemia. Transient Ischemic Dilatation (Normal <1.22):  1.20 Lung/Heart Ratio (Normal <0.45):  0.39  Quantitative Gated Spect Images QGS EDV:  132 ml QGS ESV:  73 ml QGS cine images:  Akinesis of the distal anteroseptal wall and apex. QGS EF: 45%  Impression Exercise Capacity:  Lexiscan with no exercise. BP Response:  Normal blood pressure response. Clinical Symptoms:  No chest pain. ECG Impression:  No significant ST segment change suggestive of ischemia. Comparison with Prior Nuclear Study: Findings are new compared to 1/282003.  Overall Impression:  Abnormal stress nuclear study with a large partially reversible defect in the distal anterior,  distal inferior and apical walls suggestive of prior infarct and mild to moderate peri-infarct ischemia; there also appears to be transient ischemic dilatation of the LV cavity.  Olga Millers

## 2011-08-10 ENCOUNTER — Encounter (HOSPITAL_COMMUNITY)
Admission: RE | Admit: 2011-08-10 | Discharge: 2011-08-10 | Disposition: A | Discharge status: Home / Self Care | Payer: Medicare Other | Source: Ambulatory Visit | Attending: Nephrology | Admitting: Nephrology

## 2011-08-10 MED ORDER — EPOETIN ALFA 20000 UNIT/ML IJ SOLN
INTRAMUSCULAR | Status: AC
  Administered 2011-08-10: 20000 [IU]
  Filled 2011-08-10: qty 1

## 2011-08-10 MED ORDER — EPOETIN ALFA 10000 UNIT/ML IJ SOLN: 20000.0000 [IU] | INTRAMUSCULAR | Status: DC

## 2011-08-13 ENCOUNTER — Other Ambulatory Visit: Payer: Self-pay

## 2011-08-13 DIAGNOSIS — Z48812 Encounter for surgical aftercare following surgery on the circulatory system: Secondary | ICD-10-CM

## 2011-08-23 ENCOUNTER — Ambulatory Visit (INDEPENDENT_AMBULATORY_CARE_PROVIDER_SITE_OTHER): Payer: Medicare Other | Admitting: Vascular Surgery

## 2011-08-23 ENCOUNTER — Encounter: Payer: Self-pay | Admitting: Vascular Surgery

## 2011-08-23 DIAGNOSIS — Z48812 Encounter for surgical aftercare following surgery on the circulatory system: Secondary | ICD-10-CM

## 2011-08-23 DIAGNOSIS — I6529 Occlusion and stenosis of unspecified carotid artery: Secondary | ICD-10-CM

## 2011-08-23 NOTE — Progress Notes (Signed)
Carotid duplex performed 08/23/2011 @ [facility]

## 2011-08-24 ENCOUNTER — Encounter: Payer: Self-pay | Admitting: Vascular Surgery

## 2011-08-24 ENCOUNTER — Ambulatory Visit (INDEPENDENT_AMBULATORY_CARE_PROVIDER_SITE_OTHER): Payer: Medicare Other | Admitting: Vascular Surgery

## 2011-08-24 VITALS — BP 125/79 | HR 54 | Resp 16 | Ht 66.0 in | Wt 174.0 lb

## 2011-08-24 DIAGNOSIS — I6529 Occlusion and stenosis of unspecified carotid artery: Secondary | ICD-10-CM

## 2011-08-24 DIAGNOSIS — S91309A Unspecified open wound, unspecified foot, initial encounter: Secondary | ICD-10-CM

## 2011-08-24 DIAGNOSIS — M629 Disorder of muscle, unspecified: Secondary | ICD-10-CM

## 2011-08-24 MED ORDER — LEVOFLOXACIN 500 MG PO TABS: 500.0000 mg | ORAL_TABLET | Freq: Every day | ORAL | Status: AC

## 2011-08-24 NOTE — Progress Notes (Signed)
History of Present Illness: Patient is a 65 y.o. year old female who presents for follow-up evaluation for PAD. She developed a non healing ulcer on her right foot about 8 weeks ago. She has been followed by Dr. Tanda Rockers at the [facility] and this has been slowly improving. However she did have an abnormal arterial non-invasive scan. Her atherosclerotic risk factors remain diabetes, elevated cholesterol, hypertension, renal dysfunction, neuropathy, and coronary artery disease. These are all currently stable and followed by the primary care physician. The patient currently has no claudication symptoms. The patient denies rest pain but does have numbness from her neuropathy.   Review of Systems:  Neurologic: sensation in the feet is decreased  Cardiac:denies shortness of breath or chest pain  Pulmonary: denies cough or wheeze   Social History  History   Substance Use Topics   .  Smoking status:  Former Smoker     Quit date:  09/26/1967   .  Smokeless tobacco:  Not on file   .  Alcohol Use:  No    Allergies  Allergies   Allergen  Reactions   .  Codeine     Current Outpatient Prescriptions   Medication  Sig  Dispense  Refill   .  aspirin (ASPIRIN LOW DOSE) 81 MG EC tablet  Take 81 mg by mouth daily.     .  Blood Glucose Monitoring Suppl (PRODIGY AUTOCODE BLOOD GLUCOSE) W/DEVICE KIT  by Does not apply route 3 (three) times daily.     .  calcitRIOL (ROCALTROL) 0.25 MCG capsule  Take 0.25 mcg by mouth daily.     .  clopidogrel (PLAVIX) 75 MG tablet  Take 75 mg by mouth daily.     .  famotidine (PEPCID) 40 MG tablet  Take 40 mg by mouth daily.     .  Foot Care Products [facility] FOR HER OPEN SHOE) MISC  by Does not apply route. Patient has Rx for diabetic shoes     .  Furosemide (LASIX PO)  Take 100 mg by mouth 2 (two) times daily.     Marland Kitchen  gabapentin (NEURONTIN) 100 MG tablet  Take 100 mg by mouth 3 (three) times daily.     .  Glucose Blood KIT  by In Vitro route.     Marland Kitchen  glucose blood  test strip  1 each by Other route 3 (three) times daily. Use as instructed     .  hydrALAZINE (APRESOLINE) 50 MG tablet  Take 50 mg by mouth 3 (three) times daily.     .  insulin aspart (NOVOLOG) 100 UNIT/ML injection  Inject into the skin 3 (three) times daily before meals. Sliding scale     .  insulin glargine (LANTUS) 100 UNIT/ML injection  Inject into the skin 2 (two) times daily. Inject 32 units in the am and 28 units in the pm daily     .  Lancets MISC  by Does not apply route 3 (three) times daily.     .  metoprolol tartrate (LOPRESSOR) 25 MG tablet  Take 25 mg by mouth 2 (two) times daily.     .  pantoprazole (PROTONIX) 40 MG tablet  Take 40 mg by mouth daily.     .  potassium chloride (KLOR-CON) 20 MEQ packet  Take 20 mEq by mouth daily.     .  pravastatin (PRAVACHOL) 40 MG tablet  Take 40 mg by mouth daily.     Marland Kitchen  torsemide (DEMADEX) 100  MG tablet  Take 100 mg by mouth 2 (two) times daily.      Physical Examination   Filed Vitals:   08/24/11 0941  BP: 125/79  Pulse: 54  Resp: 16  Height: 5\' 6"  (1.676 m)  Weight: 174 lb (78.926 kg)  SpO2: 98%    Body mass index is 28.33 kg/(m^2).  General: Alert and oriented, no acute distress  HEENT: Normal  Neck: No bruit or JVD, well healed carotid scar left side  Neurologic: Upper and lower extremity motor 5/5 and symmetric  Extremities: 2+ femoral right, otherwise left femoral and bilat pop pedal pulses non palpable  Skin: right base of first toe with some erythema and an almost healed 3 x 1 cm ulcer some erythema over metatarsal wound base less than 1 mm depth with good granulation  DATA:  Carotid duplex scan from yesterday was reviewed and interpreted. This shows no significant stenosis at the previous left carotid endarterectomy site. There is minimal plaque and less than 40% stenosis on the right internal carotid artery.  ASSESSMENT: Slowly healing wound right foot, however, the patient states that the wound is improving currently.  She does have evidence of arterial occlusive disease and most likely I would suspect that this is probably popliteal and tibial disease from her diabetes   PLAN: I agree with Dr. Tanda Rockers that she needs continued close followup.  If the wound on her left foot continues to deteriorate or worsen over time we would need to consider arteriogram for possible revascularization. However, if the wound continues to heal spontaneously we will continue observation at this point. Since the metatarsal area has some erythema we will start Levaquin today and treat for one month.  Follow up  1 month  Carotid duplex in one year   Fabienne Bruns, MD  [facility] [place]  Office: [phone]  Pager: [phone]

## 2011-08-25 ENCOUNTER — Encounter (HOSPITAL_COMMUNITY): Payer: Medicare Other

## 2011-08-28 ENCOUNTER — Encounter (HOSPITAL_COMMUNITY)
Admission: RE | Admit: 2011-08-28 | Discharge: 2011-08-28 | Disposition: A | Discharge status: Home / Self Care | Payer: Medicare Other | Source: Ambulatory Visit | Attending: Nephrology | Admitting: Nephrology

## 2011-08-28 DIAGNOSIS — D638 Anemia in other chronic diseases classified elsewhere: Secondary | ICD-10-CM | POA: Insufficient documentation

## 2011-08-28 DIAGNOSIS — N184 Chronic kidney disease, stage 4 (severe): Secondary | ICD-10-CM | POA: Insufficient documentation

## 2011-08-28 LAB — IRON AND TIBC
Saturation Ratios: 49 % (ref 20–55)
TIBC: 209 ug/dL — ABNORMAL LOW (ref 250–470)
UIBC: 107 ug/dL — ABNORMAL LOW (ref 125–400)

## 2011-08-28 LAB — RENAL FUNCTION PANEL
CO2: 24 mEq/L (ref 19–32)
Chloride: 99 mEq/L (ref 96–112)
Creatinine, Ser: 3.6 mg/dL — ABNORMAL HIGH (ref 0.50–1.10)
GFR calc non Af Amer: 12 mL/min — ABNORMAL LOW (ref >90)
Glucose, Bld: 257 mg/dL — ABNORMAL HIGH (ref 70–99)

## 2011-08-28 LAB — POCT HEMOGLOBIN-HEMACUE: Hemoglobin: 11 g/dL — ABNORMAL LOW (ref 12.0–15.0)

## 2011-08-28 MED ORDER — EPOETIN ALFA 20000 UNIT/ML IJ SOLN
INTRAMUSCULAR | Status: AC
  Administered 2011-08-28: 20000 [IU] via SUBCUTANEOUS
  Filled 2011-08-28: qty 1

## 2011-08-28 MED ORDER — EPOETIN ALFA 10000 UNIT/ML IJ SOLN: 20000.0000 [IU] | INTRAMUSCULAR | Status: DC

## 2011-09-01 NOTE — Procedures (Unsigned)
CAROTID DUPLEX EXAM  INDICATION:  Follow up carotid stenosis.  HISTORY: Diabetes:  Yes. Cardiac:  CHF, MI. Hypertension:  Yes. Smoking:  Previous. Previous Surgery:  Left carotid endarterectomy on 07/16/2007. CV History:  Currently complains of bilateral leg weakness and trouble with vision in the right eye. Amaurosis Fugax No, Paresthesias No, Hemiparesis No.                                      RIGHT             LEFT Brachial systolic pressure:         154               148 Brachial Doppler waveforms:         WNL               WNL Vertebral direction of flow:        Antegrade         Antegrade DUPLEX VELOCITIES (cm/sec) CCA peak systolic                   79                103 ECA peak systolic                   255               116 ICA peak systolic                   103               64 ICA end diastolic                   24                17 PLAQUE MORPHOLOGY:                  Calcified         Heterogenous PLAQUE AMOUNT:                      Mild              Mild PLAQUE LOCATION:                    CCA/ICA/ECA       CCA/ICA  IMPRESSION: 1. Right internal carotid artery stenosis present in the 1% to 39%     range. 2. Right external carotid artery stenosis present. 3. Left common carotid artery disease present. 4. Left internal carotid artery is patent with a history of     endarterectomy.  No hemodynamically significant plaque is     identified. 5. Improvement on the left and stable on the right since previous     study on 07/03/2007.  ___________________________________________ Janetta Hora. Fields, MD  SH/MEDQ  D:  08/23/2011  T:  08/23/2011  Job:  [id number]

## 2011-09-06 ENCOUNTER — Other Ambulatory Visit (HOSPITAL_COMMUNITY): Payer: Self-pay | Admitting: *Deleted

## 2011-09-11 ENCOUNTER — Encounter (HOSPITAL_COMMUNITY)
Admission: RE | Admit: 2011-09-11 | Discharge: 2011-09-11 | Disposition: A | Discharge status: Home / Self Care | Payer: Medicare Other | Source: Ambulatory Visit | Attending: Nephrology | Admitting: Nephrology

## 2011-09-11 MED ORDER — EPOETIN ALFA 10000 UNIT/ML IJ SOLN: 20000.0000 [IU] | INTRAMUSCULAR | Status: DC

## 2011-09-11 MED ORDER — EPOETIN ALFA 20000 UNIT/ML IJ SOLN
INTRAMUSCULAR | Status: AC
  Administered 2011-09-11: 20000 [IU] via SUBCUTANEOUS
  Filled 2011-09-11: qty 1

## 2011-09-21 ENCOUNTER — Encounter: Payer: Self-pay | Admitting: Vascular Surgery

## 2011-09-21 ENCOUNTER — Ambulatory Visit (INDEPENDENT_AMBULATORY_CARE_PROVIDER_SITE_OTHER): Payer: Medicare Other | Admitting: Vascular Surgery

## 2011-09-21 VITALS — BP 85/53 | HR 55 | Resp 16 | Ht 66.0 in | Wt 177.0 lb

## 2011-09-21 DIAGNOSIS — M629 Disorder of muscle, unspecified: Secondary | ICD-10-CM

## 2011-09-21 NOTE — Progress Notes (Signed)
History of Present Illness: Patient is a 65 y.o. year old female who presents for follow-up evaluation for PAD. She developed a non healing ulcer on her right foot about 12 weeks ago. She has been followed by Dr. Tanda Rockers at the [facility] and this has been slowly improving. However she did have an abnormal arterial non-invasive scan. Her atherosclerotic risk factors remain diabetes, elevated cholesterol, hypertension, renal dysfunction, neuropathy, and coronary artery disease. These are all currently stable and followed by the primary care physician. The patient currently has no claudication symptoms. The patient denies rest pain but does have numbness from her neuropathy.   Review of Systems:  Neurologic: sensation in the feet is decreased  Cardiac:denies shortness of breath or chest pain  Pulmonary: denies cough or wheeze   Social History  History   Substance Use Topics   .  Smoking status:  Former Smoker     Quit date:  09/26/1967   .  Smokeless tobacco:  Not on file   .  Alcohol Use:  No   Allergies  Allergies   Allergen  Reactions   .  Codeine     Current Outpatient Prescriptions   Medication  Sig  Dispense  Refill   .  aspirin (ASPIRIN LOW DOSE) 81 MG EC tablet  Take 81 mg by mouth daily.     .  Blood Glucose Monitoring Suppl (PRODIGY AUTOCODE BLOOD GLUCOSE) W/DEVICE KIT  by Does not apply route 3 (three) times daily.     .  calcitRIOL (ROCALTROL) 0.25 MCG capsule  Take 0.25 mcg by mouth daily.     .  clopidogrel (PLAVIX) 75 MG tablet  Take 75 mg by mouth daily.     .  famotidine (PEPCID) 40 MG tablet  Take 40 mg by mouth daily.     .  Foot Care Products [facility] FOR HER OPEN SHOE) MISC  by Does not apply route. Patient has Rx for diabetic shoes     .  Furosemide (LASIX PO)  Take 100 mg by mouth 2 (two) times daily.     Marland Kitchen  gabapentin (NEURONTIN) 100 MG tablet  Take 100 mg by mouth 3 (three) times daily.     .  Glucose Blood KIT  by In Vitro route.     Marland Kitchen  glucose blood  test strip  1 each by Other route 3 (three) times daily. Use as instructed     .  hydrALAZINE (APRESOLINE) 50 MG tablet  Take 50 mg by mouth 3 (three) times daily.     .  insulin aspart (NOVOLOG) 100 UNIT/ML injection  Inject into the skin 3 (three) times daily before meals. Sliding scale     .  insulin glargine (LANTUS) 100 UNIT/ML injection  Inject into the skin 2 (two) times daily. Inject 32 units in the am and 28 units in the pm daily     .  Lancets MISC  by Does not apply route 3 (three) times daily.     .  metoprolol tartrate (LOPRESSOR) 25 MG tablet  Take 25 mg by mouth 2 (two) times daily.     .  pantoprazole (PROTONIX) 40 MG tablet  Take 40 mg by mouth daily.     .  potassium chloride (KLOR-CON) 20 MEQ packet  Take 20 mEq by mouth daily.     .  pravastatin (PRAVACHOL) 40 MG tablet  Take 40 mg by mouth daily.     Marland Kitchen  torsemide (DEMADEX) 100 MG  tablet  Take 100 mg by mouth 2 (two) times daily.     Physical Examination  Filed Vitals:    08/24/11 0941   BP:  125/79   Pulse:  54   Resp:  16   Height:  5\' 6"  (1.676 m)   Weight:  174 lb (78.926 kg)   SpO2:  98%   Body mass index is 28.33 kg/(m^2).  General: Alert and oriented, no acute distress  HEENT: Normal  Neck: No bruit or JVD, well healed carotid scar left side  Neurologic: Upper and lower extremity motor 5/5 and symmetric  Extremities: 2+ femoral right, otherwise left femoral and bilat pop pedal pulses non palpable  Skin: right base of first toe with some erythema and an almost healed 1.5 x 0.5 cm ulcer some erythema over metatarsal wound base less than 1 mm depth with good granulation this is a 50% reduction   ASSESSMENT: Slowly healing right first toe ulcer  PLAN: Follow up wound check 6 weeks  Fabienne Bruns, MD  [facility] [place]  Office: [phone]  Pager: [phone]

## 2011-09-25 ENCOUNTER — Encounter (HOSPITAL_COMMUNITY)
Admission: RE | Admit: 2011-09-25 | Discharge: 2011-09-25 | Disposition: A | Discharge status: Home / Self Care | Payer: Medicare Other | Source: Ambulatory Visit | Attending: Nephrology | Admitting: Nephrology

## 2011-09-25 MED ORDER — EPOETIN ALFA 10000 UNIT/ML IJ SOLN: 20000.0000 [IU] | INTRAMUSCULAR | Status: DC

## 2011-09-25 MED ORDER — EPOETIN ALFA 20000 UNIT/ML IJ SOLN
INTRAMUSCULAR | Status: AC
  Administered 2011-09-25: 20000 [IU] via SUBCUTANEOUS
  Filled 2011-09-25: qty 1

## 2011-09-27 LAB — POCT HEMOGLOBIN-HEMACUE: Hemoglobin: 11.1 g/dL — ABNORMAL LOW (ref 12.0–15.0)

## 2011-10-09 ENCOUNTER — Encounter (HOSPITAL_COMMUNITY)
Admission: RE | Admit: 2011-10-09 | Discharge: 2011-10-09 | Disposition: A | Discharge status: Home / Self Care | Payer: Medicare Other | Source: Ambulatory Visit | Attending: Nephrology | Admitting: Nephrology

## 2011-10-09 DIAGNOSIS — N184 Chronic kidney disease, stage 4 (severe): Secondary | ICD-10-CM | POA: Insufficient documentation

## 2011-10-09 DIAGNOSIS — D638 Anemia in other chronic diseases classified elsewhere: Secondary | ICD-10-CM | POA: Insufficient documentation

## 2011-10-09 LAB — RENAL FUNCTION PANEL
CO2: 27 mEq/L (ref 19–32)
Calcium: 9.9 mg/dL (ref 8.4–10.5)
Creatinine, Ser: 3.11 mg/dL — ABNORMAL HIGH (ref 0.50–1.10)
GFR calc non Af Amer: 15 mL/min — ABNORMAL LOW (ref >90)
Glucose, Bld: 165 mg/dL — ABNORMAL HIGH (ref 70–99)
Phosphorus: 4.4 mg/dL (ref 2.3–4.6)
Sodium: 138 mEq/L (ref 135–145)

## 2011-10-09 LAB — POCT HEMOGLOBIN-HEMACUE: Hemoglobin: 11.1 g/dL — ABNORMAL LOW (ref 12.0–15.0)

## 2011-10-09 LAB — IRON AND TIBC
Saturation Ratios: 31 % (ref 20–55)
UIBC: 140 ug/dL (ref 125–400)

## 2011-10-09 MED ORDER — EPOETIN ALFA 10000 UNIT/ML IJ SOLN: 20000.0000 [IU] | INTRAMUSCULAR | Status: DC

## 2011-10-09 MED ORDER — EPOETIN ALFA 20000 UNIT/ML IJ SOLN
INTRAMUSCULAR | Status: AC
  Administered 2011-10-09: 20000 [IU] via SUBCUTANEOUS
  Filled 2011-10-09: qty 1

## 2011-10-23 ENCOUNTER — Encounter (HOSPITAL_COMMUNITY): Payer: Medicare Other

## 2011-10-25 ENCOUNTER — Encounter (HOSPITAL_COMMUNITY)
Admission: RE | Admit: 2011-10-25 | Discharge: 2011-10-25 | Disposition: A | Discharge status: Home / Self Care | Payer: Medicare Other | Source: Ambulatory Visit | Attending: Nephrology | Admitting: Nephrology

## 2011-10-25 MED ORDER — EPOETIN ALFA 10000 UNIT/ML IJ SOLN: 20000.0000 [IU] | INTRAMUSCULAR | Status: DC

## 2011-10-25 MED ORDER — EPOETIN ALFA 20000 UNIT/ML IJ SOLN
INTRAMUSCULAR | Status: AC
  Administered 2011-10-25: 20000 [IU] via SUBCUTANEOUS
  Filled 2011-10-25: qty 1

## 2011-10-26 LAB — POCT HEMOGLOBIN-HEMACUE: Hemoglobin: 10.8 g/dL — ABNORMAL LOW (ref 12.0–15.0)

## 2011-11-01 ENCOUNTER — Encounter: Payer: Self-pay | Admitting: Vascular Surgery

## 2011-11-02 ENCOUNTER — Ambulatory Visit (INDEPENDENT_AMBULATORY_CARE_PROVIDER_SITE_OTHER): Payer: Medicare Other | Admitting: Vascular Surgery

## 2011-11-02 ENCOUNTER — Encounter: Payer: Self-pay | Admitting: Vascular Surgery

## 2011-11-02 VITALS — BP 133/67 | HR 61 | Resp 16 | Ht 66.0 in | Wt 176.0 lb

## 2011-11-02 DIAGNOSIS — M629 Disorder of muscle, unspecified: Secondary | ICD-10-CM

## 2011-11-02 NOTE — Progress Notes (Signed)
She returns for followup today. She was last seen on December 27. She had an ulcer over her right first toe which was 1.5 cm x 0.5 cm at that time. She is currently being followed at the wound care center in [facility] by Dr. Tanda Rockers. She presents today for further followup concerning ulcer. She does have a history of abnormal ABIs. We have been trying to see if this would heal by conservative management. She states that the wound is continuing to get smaller. She has had very close followup with Dr. Tanda Rockers.  Review of systems: She denies any shortness of breath, she denies any chest pain, she denies any new ulcerations or wounds  Physical exam: Filed Vitals:   11/02/11 0916  BP: 133/67  Pulse: 61  Resp: 16  Height: 5\' 6"  (1.676 m)  Weight: 176 lb (79.833 kg)  SpO2: 99%    Right foot: 15 mm x 2 mm wound with granulation tissue at the base  Vascular: No palpable dorsalis pedis or posterior tibial pulse bilaterally  Skin: Dry scaly skin feet bilaterally  Assessment: Healing wound right foot which has reduced in size 50% and less than a month  Plan: The patient will continue followup at the [facility] wound center. If she has deterioration of the wound further she will return for followup and we'll consider an arteriogram. Otherwise she will followup on as-needed basis.

## 2011-11-08 ENCOUNTER — Encounter (INDEPENDENT_AMBULATORY_CARE_PROVIDER_SITE_OTHER): Payer: Medicare Other | Admitting: Ophthalmology

## 2011-11-09 ENCOUNTER — Encounter (HOSPITAL_COMMUNITY)
Admission: RE | Admit: 2011-11-09 | Discharge: 2011-11-09 | Disposition: A | Discharge status: Home / Self Care | Payer: Medicare Other | Source: Ambulatory Visit | Attending: Nephrology | Admitting: Nephrology

## 2011-11-09 ENCOUNTER — Encounter (INDEPENDENT_AMBULATORY_CARE_PROVIDER_SITE_OTHER): Payer: Medicare Other | Admitting: Ophthalmology

## 2011-11-09 DIAGNOSIS — D638 Anemia in other chronic diseases classified elsewhere: Secondary | ICD-10-CM | POA: Insufficient documentation

## 2011-11-09 DIAGNOSIS — N184 Chronic kidney disease, stage 4 (severe): Secondary | ICD-10-CM | POA: Insufficient documentation

## 2011-11-09 LAB — RENAL FUNCTION PANEL
Albumin: 3.7 g/dL (ref 3.5–5.2)
Calcium: 10.3 mg/dL (ref 8.4–10.5)
GFR calc Af Amer: 15 mL/min — ABNORMAL LOW (ref >90)
GFR calc non Af Amer: 13 mL/min — ABNORMAL LOW (ref >90)
Phosphorus: 4.1 mg/dL (ref 2.3–4.6)
Potassium: 3.7 mEq/L (ref 3.5–5.1)
Sodium: 138 mEq/L (ref 135–145)

## 2011-11-09 LAB — IRON AND TIBC: Iron: 56 ug/dL (ref 42–135)

## 2011-11-09 MED ORDER — EPOETIN ALFA 10000 UNIT/ML IJ SOLN: 20000.0000 [IU] | INTRAMUSCULAR | Status: DC

## 2011-11-09 MED ORDER — EPOETIN ALFA 20000 UNIT/ML IJ SOLN
INTRAMUSCULAR | Status: AC
  Administered 2011-11-09: 20000 [IU] via SUBCUTANEOUS
  Filled 2011-11-09: qty 1

## 2011-11-14 ENCOUNTER — Encounter (INDEPENDENT_AMBULATORY_CARE_PROVIDER_SITE_OTHER): Payer: Medicare Other | Admitting: Ophthalmology

## 2011-11-15 ENCOUNTER — Inpatient Hospital Stay (HOSPITAL_COMMUNITY)
Admission: EM | Admit: 2011-11-15 | Discharge: 2011-11-22 | DRG: 264 | Disposition: A | Discharge status: Home / Self Care | Payer: Medicare Other | Attending: Internal Medicine | Admitting: Internal Medicine

## 2011-11-15 ENCOUNTER — Emergency Department (HOSPITAL_COMMUNITY): Payer: Medicare Other

## 2011-11-15 ENCOUNTER — Encounter (HOSPITAL_COMMUNITY): Payer: Self-pay

## 2011-11-15 DIAGNOSIS — Z794 Long term (current) use of insulin: Secondary | ICD-10-CM

## 2011-11-15 DIAGNOSIS — I5043 Acute on chronic combined systolic (congestive) and diastolic (congestive) heart failure: Secondary | ICD-10-CM | POA: Diagnosis present

## 2011-11-15 DIAGNOSIS — E1139 Type 2 diabetes mellitus with other diabetic ophthalmic complication: Secondary | ICD-10-CM | POA: Diagnosis present

## 2011-11-15 DIAGNOSIS — E11319 Type 2 diabetes mellitus with unspecified diabetic retinopathy without macular edema: Secondary | ICD-10-CM | POA: Diagnosis present

## 2011-11-15 DIAGNOSIS — Z7902 Long term (current) use of antithrombotics/antiplatelets: Secondary | ICD-10-CM

## 2011-11-15 DIAGNOSIS — N186 End stage renal disease: Secondary | ICD-10-CM

## 2011-11-15 DIAGNOSIS — K449 Diaphragmatic hernia without obstruction or gangrene: Secondary | ICD-10-CM | POA: Diagnosis present

## 2011-11-15 DIAGNOSIS — N179 Acute kidney failure, unspecified: Secondary | ICD-10-CM | POA: Diagnosis not present

## 2011-11-15 DIAGNOSIS — N184 Chronic kidney disease, stage 4 (severe): Secondary | ICD-10-CM

## 2011-11-15 DIAGNOSIS — I214 Non-ST elevation (NSTEMI) myocardial infarction: Principal | ICD-10-CM | POA: Diagnosis present

## 2011-11-15 DIAGNOSIS — N185 Chronic kidney disease, stage 5: Secondary | ICD-10-CM

## 2011-11-15 DIAGNOSIS — I2510 Atherosclerotic heart disease of native coronary artery without angina pectoris: Secondary | ICD-10-CM | POA: Diagnosis present

## 2011-11-15 DIAGNOSIS — G589 Mononeuropathy, unspecified: Secondary | ICD-10-CM | POA: Diagnosis present

## 2011-11-15 DIAGNOSIS — K219 Gastro-esophageal reflux disease without esophagitis: Secondary | ICD-10-CM | POA: Diagnosis present

## 2011-11-15 DIAGNOSIS — E119 Type 2 diabetes mellitus without complications: Secondary | ICD-10-CM

## 2011-11-15 DIAGNOSIS — I5042 Chronic combined systolic (congestive) and diastolic (congestive) heart failure: Secondary | ICD-10-CM

## 2011-11-15 DIAGNOSIS — L97509 Non-pressure chronic ulcer of other part of unspecified foot with unspecified severity: Secondary | ICD-10-CM | POA: Diagnosis present

## 2011-11-15 DIAGNOSIS — Y832 Surgical operation with anastomosis, bypass or graft as the cause of abnormal reaction of the patient, or of later complication, without mention of misadventure at the time of the procedure: Secondary | ICD-10-CM | POA: Diagnosis present

## 2011-11-15 DIAGNOSIS — D539 Nutritional anemia, unspecified: Secondary | ICD-10-CM | POA: Insufficient documentation

## 2011-11-15 DIAGNOSIS — I509 Heart failure, unspecified: Secondary | ICD-10-CM | POA: Diagnosis present

## 2011-11-15 DIAGNOSIS — N2581 Secondary hyperparathyroidism of renal origin: Secondary | ICD-10-CM | POA: Diagnosis present

## 2011-11-15 DIAGNOSIS — T82898A Other specified complication of vascular prosthetic devices, implants and grafts, initial encounter: Secondary | ICD-10-CM | POA: Diagnosis present

## 2011-11-15 DIAGNOSIS — Z7982 Long term (current) use of aspirin: Secondary | ICD-10-CM

## 2011-11-15 DIAGNOSIS — I739 Peripheral vascular disease, unspecified: Secondary | ICD-10-CM | POA: Diagnosis present

## 2011-11-15 DIAGNOSIS — E785 Hyperlipidemia, unspecified: Secondary | ICD-10-CM | POA: Diagnosis present

## 2011-11-15 DIAGNOSIS — Z9849 Cataract extraction status, unspecified eye: Secondary | ICD-10-CM

## 2011-11-15 DIAGNOSIS — I252 Old myocardial infarction: Secondary | ICD-10-CM

## 2011-11-15 DIAGNOSIS — R079 Chest pain, unspecified: Secondary | ICD-10-CM

## 2011-11-15 DIAGNOSIS — Z8673 Personal history of transient ischemic attack (TIA), and cerebral infarction without residual deficits: Secondary | ICD-10-CM

## 2011-11-15 DIAGNOSIS — I6529 Occlusion and stenosis of unspecified carotid artery: Secondary | ICD-10-CM

## 2011-11-15 DIAGNOSIS — I2589 Other forms of chronic ischemic heart disease: Secondary | ICD-10-CM | POA: Diagnosis present

## 2011-11-15 DIAGNOSIS — I5033 Acute on chronic diastolic (congestive) heart failure: Secondary | ICD-10-CM

## 2011-11-15 DIAGNOSIS — I120 Hypertensive chronic kidney disease with stage 5 chronic kidney disease or end stage renal disease: Secondary | ICD-10-CM | POA: Diagnosis present

## 2011-11-15 DIAGNOSIS — Z79899 Other long term (current) drug therapy: Secondary | ICD-10-CM

## 2011-11-15 DIAGNOSIS — I10 Essential (primary) hypertension: Secondary | ICD-10-CM | POA: Insufficient documentation

## 2011-11-15 DIAGNOSIS — D62 Acute posthemorrhagic anemia: Secondary | ICD-10-CM | POA: Diagnosis not present

## 2011-11-15 DIAGNOSIS — E669 Obesity, unspecified: Secondary | ICD-10-CM | POA: Diagnosis present

## 2011-11-15 DIAGNOSIS — M1990 Unspecified osteoarthritis, unspecified site: Secondary | ICD-10-CM | POA: Diagnosis present

## 2011-11-15 HISTORY — DX: Personal history of other diseases of the musculoskeletal system and connective tissue: Z87.39

## 2011-11-15 HISTORY — DX: Iron deficiency anemia, unspecified: D50.9

## 2011-11-15 HISTORY — DX: Peripheral vascular disease, unspecified: I73.9

## 2011-11-15 HISTORY — DX: Personal history of other diseases of the nervous system and sense organs: Z86.69

## 2011-11-15 HISTORY — DX: Personal history of transient ischemic attack (TIA), and cerebral infarction without residual deficits: Z86.73

## 2011-11-15 LAB — URINE MICROSCOPIC-ADD ON

## 2011-11-15 LAB — CBC
Hemoglobin: 8.9 g/dL — ABNORMAL LOW (ref 12.0–15.0)
MCH: 29.5 pg (ref 26.0–34.0)
RBC: 3.02 MIL/uL — ABNORMAL LOW (ref 3.87–5.11)

## 2011-11-15 LAB — URINALYSIS, ROUTINE W REFLEX MICROSCOPIC
Bilirubin Urine: NEGATIVE
Ketones, ur: NEGATIVE mg/dL
Nitrite: NEGATIVE
Protein, ur: NEGATIVE mg/dL
Urobilinogen, UA: 0.2 mg/dL (ref 0.0–1.0)

## 2011-11-15 LAB — COMPREHENSIVE METABOLIC PANEL
Alkaline Phosphatase: 59 U/L (ref 39–117)
BUN: 55 mg/dL — ABNORMAL HIGH (ref 6–23)
CO2: 20 mEq/L (ref 19–32)
Chloride: 93 mEq/L — ABNORMAL LOW (ref 96–112)
GFR calc Af Amer: 14 mL/min — ABNORMAL LOW (ref >90)
GFR calc non Af Amer: 13 mL/min — ABNORMAL LOW (ref >90)
Glucose, Bld: 477 mg/dL — ABNORMAL HIGH (ref 70–99)
Potassium: 4.2 mEq/L (ref 3.5–5.1)
Total Bilirubin: 0.9 mg/dL (ref 0.3–1.2)
Total Protein: 6.6 g/dL (ref 6.0–8.3)

## 2011-11-15 LAB — DIFFERENTIAL
Eosinophils Absolute: 0.1 10*3/uL (ref 0.0–0.7)
Lymphocytes Relative: 8 % — ABNORMAL LOW (ref 12–46)
Lymphs Abs: 0.9 10*3/uL (ref 0.7–4.0)
Monocytes Relative: 7 % (ref 3–12)
Neutrophils Relative %: 84 % — ABNORMAL HIGH (ref 43–77)

## 2011-11-15 LAB — PROTIME-INR: INR: 1.3 (ref 0.00–1.49)

## 2011-11-15 LAB — CARDIAC PANEL(CRET KIN+CKTOT+MB+TROPI): Troponin I: 6.59 ng/mL (ref <0.30)

## 2011-11-15 MED ORDER — ONDANSETRON HCL 4 MG/2ML IJ SOLN: 4.0000 mg | Freq: Four times a day (QID) | INTRAMUSCULAR | Status: DC | PRN

## 2011-11-15 MED ORDER — INSULIN ASPART 100 UNIT/ML ~~LOC~~ SOLN
0.0000 [IU] | Freq: Three times a day (TID) | SUBCUTANEOUS | Status: DC
  Administered 2011-11-16 (×3): 8 [IU] via SUBCUTANEOUS
  Administered 2011-11-17: 2 [IU] via SUBCUTANEOUS
  Administered 2011-11-17 – 2011-11-18 (×2): 5 [IU] via SUBCUTANEOUS
  Administered 2011-11-19: 8 [IU] via SUBCUTANEOUS
  Administered 2011-11-19: 5 [IU] via SUBCUTANEOUS
  Administered 2011-11-20: 8 [IU] via SUBCUTANEOUS
  Administered 2011-11-20: 11 [IU] via SUBCUTANEOUS
  Administered 2011-11-20: 2 [IU] via SUBCUTANEOUS
  Administered 2011-11-21: 3 [IU] via SUBCUTANEOUS
  Administered 2011-11-21: 5 [IU] via SUBCUTANEOUS
  Administered 2011-11-22: 3 [IU] via SUBCUTANEOUS
  Administered 2011-11-22: 11 [IU] via SUBCUTANEOUS

## 2011-11-15 MED ORDER — HEPARIN SOD (PORCINE) IN D5W 100 UNIT/ML IV SOLN
1200.0000 [IU]/h | INTRAVENOUS | Status: DC
  Administered 2011-11-15: 900 [IU]/h via INTRAVENOUS
  Filled 2011-11-15 (×3): qty 250

## 2011-11-15 MED ORDER — ASPIRIN 300 MG RE SUPP
300.0000 mg | RECTAL | Status: DC
  Filled 2011-11-15: qty 1

## 2011-11-15 MED ORDER — HYDRALAZINE HCL 50 MG PO TABS
50.0000 mg | ORAL_TABLET | Freq: Three times a day (TID) | ORAL | Status: DC
  Administered 2011-11-15 – 2011-11-17 (×5): 50 mg via ORAL
  Filled 2011-11-15 (×7): qty 1

## 2011-11-15 MED ORDER — ASPIRIN EC 81 MG PO TBEC
81.0000 mg | DELAYED_RELEASE_TABLET | Freq: Every day | ORAL | Status: DC
  Administered 2011-11-16 – 2011-11-22 (×7): 81 mg via ORAL
  Filled 2011-11-15 (×7): qty 1

## 2011-11-15 MED ORDER — ASPIRIN 81 MG PO CHEW: 324.0000 mg | CHEWABLE_TABLET | ORAL | Status: DC

## 2011-11-15 MED ORDER — ASPIRIN 81 MG PO CHEW
324.0000 mg | CHEWABLE_TABLET | Freq: Once | ORAL | Status: AC
  Administered 2011-11-15: 324 mg via ORAL
  Filled 2011-11-15: qty 4

## 2011-11-15 MED ORDER — ACETAMINOPHEN 325 MG PO TABS: 650.0000 mg | ORAL_TABLET | ORAL | Status: DC | PRN

## 2011-11-15 MED ORDER — INSULIN GLARGINE 100 UNIT/ML ~~LOC~~ SOLN
25.0000 [IU] | Freq: Every day | SUBCUTANEOUS | Status: DC
  Administered 2011-11-16 – 2011-11-22 (×6): 25 [IU] via SUBCUTANEOUS

## 2011-11-15 MED ORDER — METOPROLOL TARTRATE 25 MG PO TABS
25.0000 mg | ORAL_TABLET | Freq: Two times a day (BID) | ORAL | Status: DC
  Administered 2011-11-15 – 2011-11-16 (×3): 25 mg via ORAL
  Filled 2011-11-15 (×7): qty 1

## 2011-11-15 MED ORDER — HEPARIN BOLUS VIA INFUSION
4000.0000 [IU] | Freq: Once | INTRAVENOUS | Status: AC
  Administered 2011-11-15: 4000 [IU] via INTRAVENOUS

## 2011-11-15 MED ORDER — CALCITRIOL 0.25 MCG PO CAPS
0.2500 ug | ORAL_CAPSULE | Freq: Every day | ORAL | Status: DC
  Administered 2011-11-16 – 2011-11-22 (×6): 0.25 ug via ORAL
  Filled 2011-11-15 (×7): qty 1

## 2011-11-15 MED ORDER — GABAPENTIN 100 MG PO CAPS
100.0000 mg | ORAL_CAPSULE | Freq: Three times a day (TID) | ORAL | Status: DC
  Administered 2011-11-15 – 2011-11-22 (×21): 100 mg via ORAL
  Filled 2011-11-15 (×23): qty 1

## 2011-11-15 MED ORDER — FAMOTIDINE 40 MG PO TABS
40.0000 mg | ORAL_TABLET | Freq: Every day | ORAL | Status: DC
  Administered 2011-11-16 – 2011-11-20 (×5): 40 mg via ORAL
  Filled 2011-11-15 (×5): qty 1

## 2011-11-15 MED ORDER — PANTOPRAZOLE SODIUM 40 MG PO TBEC
40.0000 mg | DELAYED_RELEASE_TABLET | Freq: Every day | ORAL | Status: DC
  Administered 2011-11-15 – 2011-11-22 (×7): 40 mg via ORAL
  Filled 2011-11-15 (×8): qty 1

## 2011-11-15 MED ORDER — SODIUM CHLORIDE 0.9 % IJ SOLN
3.0000 mL | INTRAMUSCULAR | Status: DC | PRN
  Administered 2011-11-16: 3 mL via INTRAVENOUS

## 2011-11-15 MED ORDER — NITROGLYCERIN 2 % TD OINT: 1.0000 [in_us] | TOPICAL_OINTMENT | Freq: Four times a day (QID) | TRANSDERMAL | Status: DC

## 2011-11-15 MED ORDER — FUROSEMIDE 10 MG/ML IJ SOLN
80.0000 mg | Freq: Two times a day (BID) | INTRAMUSCULAR | Status: DC
  Administered 2011-11-15 – 2011-11-16 (×3): 80 mg via INTRAVENOUS
  Filled 2011-11-15 (×6): qty 8

## 2011-11-15 MED ORDER — INSULIN ASPART 100 UNIT/ML ~~LOC~~ SOLN
10.0000 [IU] | Freq: Once | SUBCUTANEOUS | Status: AC
  Administered 2011-11-15: 10 [IU] via INTRAVENOUS
  Filled 2011-11-15: qty 1

## 2011-11-15 MED ORDER — INSULIN GLARGINE 100 UNIT/ML ~~LOC~~ SOLN
30.0000 [IU] | Freq: Every day | SUBCUTANEOUS | Status: DC
  Administered 2011-11-15 – 2011-11-21 (×7): 30 [IU] via SUBCUTANEOUS
  Filled 2011-11-15 (×2): qty 3

## 2011-11-15 MED ORDER — INSULIN ASPART 100 UNIT/ML ~~LOC~~ SOLN
0.0000 [IU] | Freq: Three times a day (TID) | SUBCUTANEOUS | Status: DC
  Filled 2011-11-15: qty 3

## 2011-11-15 MED ORDER — SODIUM CHLORIDE 0.9 % IJ SOLN
3.0000 mL | Freq: Two times a day (BID) | INTRAMUSCULAR | Status: DC
  Administered 2011-11-15 – 2011-11-22 (×11): 3 mL via INTRAVENOUS

## 2011-11-15 MED ORDER — SODIUM CHLORIDE 0.9 % IV SOLN
250.0000 mL | INTRAVENOUS | Status: DC | PRN
  Administered 2011-11-21: 500 mL via INTRAVENOUS

## 2011-11-15 MED ORDER — ROSUVASTATIN CALCIUM 20 MG PO TABS
20.0000 mg | ORAL_TABLET | Freq: Every day | ORAL | Status: DC
  Administered 2011-11-16 – 2011-11-21 (×6): 20 mg via ORAL
  Filled 2011-11-15 (×8): qty 1

## 2011-11-15 MED ORDER — CLOPIDOGREL BISULFATE 75 MG PO TABS
75.0000 mg | ORAL_TABLET | Freq: Every day | ORAL | Status: DC
  Administered 2011-11-16 – 2011-11-22 (×7): 75 mg via ORAL
  Filled 2011-11-15 (×8): qty 1

## 2011-11-15 MED ORDER — NITROGLYCERIN 0.4 MG SL SUBL: 0.4000 mg | SUBLINGUAL_TABLET | SUBLINGUAL | Status: DC | PRN

## 2011-11-15 NOTE — H&P (Signed)
Patient ID: [name] MRN: [id number], DOB/AGE: [birth year]   Admit date: 11/15/2011   Primary Physician: Rudi Heap, MD, MD Primary Cardiologist: Judie Petit. Cooper  Pt. Profile:  66 y/o female w/ h/o severe CAD, CKD 4-5, DM, who presented to ED today from PCP's office 2/2 a 2 day h/o orthopnea and chest pain.  Problem List  Past Medical History  Diagnosis Date  . IDDM (insulin dependent diabetes mellitus)   . CAD (coronary artery disease)     a. 08/2005 - NSTEMI - Cath: Severe diffuse 3vd w/ NL EF - poor revasc candidate.;  b. 07/2012 Myoview - large area of ant, dist inf, & apical infarct w/ mild to mod peri-inf ischemia.  transient ischemic dilatation of the LV cavity  . Neuropathy   . Ulcer of foot     a. R foot - followed by Dr. Arbie Cookey & [facility]  . Hypertension   . DJD (degenerative joint disease)   . Cataracts, bilateral 07/15/10  . Hyperlipidemia   . CKD (chronic kidney disease) stage 4, GFR 15-29 ml/min     a. s/p AVG  . GERD (gastroesophageal reflux disease)   . Hiatal hernia   . Heart murmur   . Joint pain   . Dizziness   . Carotid artery occlusion     a. s/p left CEA 06/2007;  b. 07/2011 u/s - RICA 1-39%, LICA patent  . Diastolic CHF, chronic   . History of stroke   . Iron deficiency anemia   . History of retinal detachment     a. right eye - repair 11/2007  . History of osteomyelitis     a. Right foot  . PVD (peripheral vascular disease)     a. ABI's 06/2011: R-37, L - 84     Past Surgical History  Procedure Date  . Cataract extraction   . Arteriovenous graft placement 2009  . Carotid endarterectomy 2008     Allergies  Allergies  Allergen Reactions  . Codeine Other (See Comments)    unknown    HPI  66 y/o female w/ above complex pmh.  She had a nstemi in 2006 w/ cath @ that time showing severe, diffuse, small vessel dzs w/o targets amenable to revascularization.  As a result, she has been medically managed.  From a cardiac  standpoint, she has done reasonably well.  She uses a walker to get around and generally doesn't experience DOE, unless she's walking long distances or faster paces.  She does not usually experience chest pain.  This past Monday, 2/18, pt noted DOE all day long.  That night, she had orthopnea, requiring that she get up frequently and also noted intermittent, somewhat fleeting 4/10 chest pressure, lasting a minute or two, resolving, then returning within 10-15 mins.  This went on all night long.  Yesterday, she had no further c/p but felt weak all day long and cont to have DOE.  Last night, she had recurrent orthopnea, this time w/o chest pain, and slept a good portion of the night in a recliner.  She saw her pcp today 2/2 these Ss and an ecg was performed showing more pronounced lat st dep.  She was sent to ER via ems.  She is currently pain free and without sob however her cardiac enzymes have returned abnl.  Home Medications  Medications Prior to Admission  Medication Dose Route Frequency Provider Last Rate Last Dose  . aspirin chewable tablet 324 mg  324 mg Oral Once  Loren Racer, MD   324 mg at 11/15/11 1730   Medications Prior to Admission  Medication Sig Dispense Refill  . aspirin (ASPIRIN LOW DOSE) 81 MG EC tablet Take 81 mg by mouth daily.        . Blood Glucose Monitoring Suppl (PRODIGY AUTOCODE BLOOD GLUCOSE) W/DEVICE KIT by Does not apply route 3 (three) times daily.        . calcitRIOL (ROCALTROL) 0.25 MCG capsule Take 0.25 mcg by mouth daily.        . clopidogrel (PLAVIX) 75 MG tablet Take 75 mg by mouth daily.        . famotidine (PEPCID) 40 MG tablet Take 40 mg by mouth daily.        . Foot Care Products [facility] FOR HER OPEN SHOE) MISC by Does not apply route. Patient has Rx for diabetic shoes         . Furosemide (LASIX PO) Take 100 mg by mouth 2 (two) times daily.        Marland Kitchen gabapentin (NEURONTIN) 100 MG tablet Take 100 mg by mouth 3 (three) times daily.        . Glucose Blood  KIT by In Vitro route.        Marland Kitchen glucose blood test strip 1 each by Other route 3 (three) times daily. Use as instructed       . hydrALAZINE (APRESOLINE) 50 MG tablet Take 50 mg by mouth 3 (three) times daily.        . insulin aspart (NOVOLOG) 100 UNIT/ML injection Inject 4-12 Units into the skin 3 (three) times daily before meals. Sliding scale        . insulin glargine (LANTUS) 100 UNIT/ML injection Inject 25-30 Units into the skin 2 (two) times daily. Inject 30 units in the am and 25 units in the pm daily        . Lancets MISC by Does not apply route 3 (three) times daily.        . metoprolol tartrate (LOPRESSOR) 25 MG tablet Take 25 mg by mouth 2 (two) times daily.        . pantoprazole (PROTONIX) 40 MG tablet Take 40 mg by mouth daily.        . pravastatin (PRAVACHOL) 40 MG tablet Take 40 mg by mouth daily.        Marland Kitchen torsemide (DEMADEX) 100 MG tablet Take 100 mg by mouth 2 (two) times daily.         Family History  Problem Relation Age of Onset  . Hypertension Other   . Stroke Other   . Heart attack Other   . Cancer Other   . Heart disease Other   . Diabetes Mother   . Cancer Mother     lung - died 87  . Cirrhosis Father     died 65  . Cancer Sister     liver  . Cancer Son     lymphatic    History   Social History  . Marital Status: Widowed    Spouse Name: N/A    Number of Children: N/A  . Years of Education: N/A   Occupational History  . Not on file.   Social History Main Topics  . Smoking status: Former Smoker    Types: Cigarettes  . Smokeless tobacco: Not on file   Comment: smoked a pack/week for about 20 yrs.  Quit about 25 y ago  . Alcohol Use: Yes     rare beer  .  Drug Use: No  . Sexually Active: Not on file   Other Topics Concern  . Not on file   Social History Narrative   Lives at home with husband in [place].  She is retired.     Review of Systems General:  No chills, fever, night sweats or weight changes.  Cardiovascular:  ++ chest pain,  dyspnea on exertion, edema,  paroxysmal nocturnal dyspnea, & orthopnea.  No palpitations, Dermatological: No rash, lesions/masses Respiratory: No cough, dyspnea Urologic: No hematuria, dysuria.  Still has good UO.  Less nocturia since PM torsemide dose adjusted about 6 mos ago. Abdominal:   No nausea, vomiting, diarrhea, bright red blood per rectum, melena, or hematemesis Neurologic:  No visual changes, wkns, changes in mental status. All other systems reviewed and are otherwise negative except as noted above.  Physical Exam  Blood pressure 103/57, pulse 68, temperature 98.5 F (36.9 C), temperature source Oral, resp. rate 16, height 5\' 6"  (1.676 m), SpO2 96.00%.  General: Pleasant, NAD Psych: Normal affect. Neuro: Alert and oriented X 3. Moves all extremities spontaneously. HEENT: Normal  Neck: Supple without bruits.  JVP ~ 12cm. Lungs:  Resp regular and unlabored w/ bibasilar crackles. Heart: RRR - distant heart sounds.  No murmurs. Abdomen: Soft, non-tender, non-distended, BS + x 4.  Extremities: No clubbing, cyanosis or edema. DP/PT trace to 1+ bilaterally.  Labs   [facility] 11/15/11 1730  CKTOTAL 238*  CKMB 10.0*  TROPONINI 5.19*   Lab Results  Component Value Date   WBC 11.2* 11/15/2011   HGB 8.9* 11/15/2011   HCT 27.2* 11/15/2011   MCV 90.1 11/15/2011   PLT 195 11/15/2011     Lab 11/15/11 1714  NA 127*  K 4.2  CL 93*  CO2 20  BUN 55*  CREATININE 3.57*  CALCIUM 9.4  PROT 6.6  BILITOT 0.9  ALKPHOS 59  ALT 9  AST 18  GLUCOSE 477*     Radiology/Studies  Dg Chest 2 View  11/15/2011  *RADIOLOGY REPORT*  Clinical Data: Shortness of breath.  History of hypertension. Diabetes.  History of abnormal EKG.  CHEST - 2 VIEW  Comparison: 11/28/2007.  Findings: There is stable moderate cardiac silhouette enlargement. Mediastinal and hilar contours appear stable.  Chronic increase in basilar reticular pulmonary markings appears stable.  No consolidation or pleural effusion is  evident.  There is osteopenic appearance of the bones.  Ectasia, tortuosity, and calcifications of the aorta are seen.  IMPRESSION: Moderate cardiac silhouette enlargement.  No pulmonary edema, consolidation, or pleural effusion.  Chronic nonspecific increase in basilar reticular markings are seen.  This may be associated with fibrosis.  No acute superimposed process evident.  Original Report Authenticated By: Crawford Givens, M.D.    ECG  Rsr,1st deg avb,  69, poor r prog, more pronounced lat st dep (I, aVL)  ASSESSMENT AND PLAN  1.  NSTEMI:  2 day h/o doe, orthopnea and c/p 2 evenings ago.  CE abnl.  Known severe 3vd along with diast chf.  She does have evidence of volume overload on exam.  Admit, cont to cycle ce, add heparin, diurese tonight, cont asa, bb, statin.  Add NTP if bp will tolerate.  Check echo.  Review cath films in am - with known severe dzs,  fairly abnl MV this past fall, and CKD, conservative Rx likely.  2.  Acute Diast CHF:  2 days of doe and orthopnea.  Evidence of volume overload on exam.  Will diurese.  Follow creat closely and  involve Neph (sees Dr. Kathrene Bongo) if any issues arise.  Check echo as above.  3.  CKD 4-5:  As above.  4.  DM:  Cont home regimen.  Add ssi.  5.  HTN:  Stable.  6.  HL:  Cont statin.   Signed, Nicolasa Ducking, NP 11/15/2011, 6:48 PM   I have seen, examined the patient, and reviewed the above assessment and plan.   Co Sign: Hillis Range, MD

## 2011-11-15 NOTE — ED Notes (Signed)
CBG Result = 473

## 2011-11-15 NOTE — ED Notes (Signed)
Critical Values received for [name] [facility]  CKMB 10 Troponin 5.19   Nicolasa Ducking, [place] notified.

## 2011-11-15 NOTE — ED Notes (Signed)
MD at bedside. 

## 2011-11-15 NOTE — ED Notes (Signed)
Report called to David, RN

## 2011-11-15 NOTE — ED Provider Notes (Addendum)
History     CSN: [id number]  Arrival date & time 11/15/11  1624   First MD Initiated Contact with Patient 11/15/11 1630      No chief complaint on file.   (Consider location/radiation/quality/duration/timing/severity/associated sxs/prior treatment) The history is provided by the patient.  Pt states she began having DOE, orthopnea, mild cough and chest pain on Monday. The chest pain resolved completely. She saw her PMD today and sent here for eval. Pt states she is comfortable, no fever, chills, N/V abd pain. No recent travel or sugeries. >1 month ago pt lasix dose was decreased.  Past Medical History  Diagnosis Date  . IDDM (insulin dependent diabetes mellitus)   . CAD (coronary artery disease)     a. 08/2005 - NSTEMI - Cath: Severe diffuse 3vd w/ NL EF - poor revasc candidate.;  b. 07/2012 Myoview - large area of ant, dist inf, & apical infarct w/ mild to mod peri-inf ischemia.  transient ischemic dilatation of the LV cavity  . Neuropathy   . Ulcer of foot     a. R foot - followed by Dr. Arbie Cookey & [facility]  . Hypertension   . DJD (degenerative joint disease)   . Cataracts, bilateral 07/15/10  . Hyperlipidemia   . CKD (chronic kidney disease) stage 4, GFR 15-29 ml/min     a. s/p AVG  . GERD (gastroesophageal reflux disease)   . Hiatal hernia   . Heart murmur   . Joint pain   . Dizziness   . Carotid artery occlusion     a. s/p left CEA 06/2007;  b. 07/2011 u/s - RICA 1-39%, LICA patent  . Diastolic CHF, chronic   . History of stroke   . Iron deficiency anemia   . History of retinal detachment     a. right eye - repair 11/2007  . History of osteomyelitis     a. Right foot  . PVD (peripheral vascular disease)     a. ABI's 06/2011: R-37, L - 84     Past Surgical History  Procedure Date  . Cataract extraction   . Arteriovenous graft placement 2009  . Carotid endarterectomy 2008    Family History  Problem Relation Age of Onset  . Hypertension Other   .  Stroke Other   . Heart attack Other   . Cancer Other   . Heart disease Other   . Diabetes Mother   . Cancer Mother     lung - died 81  . Cirrhosis Father     died 91  . Cancer Sister     liver  . Cancer Son     lymphatic    History  Substance Use Topics  . Smoking status: Former Smoker    Types: Cigarettes  . Smokeless tobacco: Not on file   Comment: smoked a pack/week for about 20 yrs.  Quit about 25 y ago  . Alcohol Use: Yes     rare beer    OB History    Grav Para Term Preterm Abortions TAB SAB Ect Mult Living                  Review of Systems  Constitutional: Negative for fever, chills and fatigue.  HENT: Positive for congestion. Negative for neck pain.   Respiratory: Positive for cough and shortness of breath. Negative for wheezing and stridor.   Cardiovascular: Positive for chest pain. Negative for palpitations and leg swelling.  Gastrointestinal: Negative for nausea, vomiting and abdominal  pain.  Genitourinary: Negative for dysuria and flank pain.  Musculoskeletal: Negative for myalgias.  Skin: Negative for color change, pallor and rash.  Neurological: Negative for dizziness, weakness, numbness and headaches.    Allergies  Codeine  Home Medications   Current Outpatient Rx  Name Route Sig Dispense Refill  . ASPIRIN 81 MG PO TBEC Oral Take 81 mg by mouth daily.      Marland Kitchen PRODIGY AUTOCODE BLOOD GLUCOSE W/DEVICE KIT Does not apply by Does not apply route 3 (three) times daily.      Marland Kitchen CALCITRIOL 0.25 MCG PO CAPS Oral Take 0.25 mcg by mouth daily.      Marland Kitchen CLOPIDOGREL BISULFATE 75 MG PO TABS Oral Take 75 mg by mouth daily.      Marland Kitchen FAMOTIDINE 40 MG PO TABS Oral Take 40 mg by mouth daily.      Marland Kitchen [employer] FOR HER OPEN SHOE MISC Does not apply by Does not apply route. Patient has Rx for diabetic shoes       . LASIX PO Oral Take 100 mg by mouth 2 (two) times daily.      Marland Kitchen GABAPENTIN 100 MG PO TABS Oral Take 100 mg by mouth 3 (three) times daily.      Marland Kitchen GLUCOSE BLOOD  VI KIT In Vitro by In Vitro route.      Marland Kitchen GLUCOSE BLOOD VI STRP Other 1 each by Other route 3 (three) times daily. Use as instructed     . HYDRALAZINE HCL 50 MG PO TABS Oral Take 50 mg by mouth 3 (three) times daily.      . INSULIN ASPART 100 UNIT/ML [place] SOLN Subcutaneous Inject 4-12 Units into the skin 3 (three) times daily before meals. Sliding scale      . INSULIN GLARGINE 100 UNIT/ML [place] SOLN Subcutaneous Inject 25-30 Units into the skin 2 (two) times daily. Inject 30 units in the am and 25 units in the pm daily      . LANCETS MISC Does not apply by Does not apply route 3 (three) times daily.      Marland Kitchen METOPROLOL TARTRATE 25 MG PO TABS Oral Take 25 mg by mouth 2 (two) times daily.      Marland Kitchen PANTOPRAZOLE SODIUM 40 MG PO TBEC Oral Take 40 mg by mouth daily.      Marland Kitchen POTASSIUM CHLORIDE CRYS ER 20 MEQ PO TBCR Oral Take 20 mEq by mouth daily as needed. For cramping    . PRAVASTATIN SODIUM 40 MG PO TABS Oral Take 40 mg by mouth daily.      . TORSEMIDE 100 MG PO TABS Oral Take 100 mg by mouth 2 (two) times daily.       BP 115/51  Pulse 69  Temp(Src) 98.5 F (36.9 C) (Oral)  Resp 23  Ht 5\' 6"  (1.676 m)  SpO2 98%  Physical Exam  Nursing note and vitals reviewed. Constitutional: She is oriented to person, place, and time. She appears well-developed and well-nourished. No distress.  HENT:  Head: Normocephalic and atraumatic.  Mouth/Throat: Oropharynx is clear and moist.  Eyes: EOM are normal. Pupils are equal, round, and reactive to light.  Neck: Normal range of motion. Neck supple.  Cardiovascular: Normal rate and regular rhythm.   Pulmonary/Chest: Effort normal. No respiratory distress. She has no wheezes. She has rales (scattered rale bl bases).  Abdominal: Soft. Bowel sounds are normal. There is no tenderness. There is no rebound and no guarding.  Musculoskeletal: Normal range of motion. She  exhibits no edema and no tenderness.  Neurological: She is alert and oriented to person, place, and  time.       Moves all ext, sensation intact  Skin: Skin is warm and dry. No rash noted. No erythema.  Psychiatric: She has a normal mood and affect. Her behavior is normal.    ED Course  Procedures (including critical care time)  Labs Reviewed  CBC - Abnormal; Notable for the following:    WBC 11.2 (*)    RBC 3.02 (*)    Hemoglobin 8.9 (*)    HCT 27.2 (*)    All other components within normal limits  DIFFERENTIAL - Abnormal; Notable for the following:    Neutrophils Relative 84 (*)    Neutro Abs 9.5 (*)    Lymphocytes Relative 8 (*)    All other components within normal limits  COMPREHENSIVE METABOLIC PANEL - Abnormal; Notable for the following:    Sodium 127 (*)    Chloride 93 (*)    Glucose, Bld 477 (*)    BUN 55 (*)    Creatinine, Ser 3.57 (*)    Albumin 3.2 (*)    GFR calc non Af Amer 13 (*)    GFR calc Af Amer 14 (*)    All other components within normal limits  URINALYSIS, ROUTINE W REFLEX MICROSCOPIC - Abnormal; Notable for the following:    APPearance CLOUDY (*)    Glucose, UA >1000 (*)    Leukocytes, UA SMALL (*)    All other components within normal limits  CARDIAC PANEL(CRET KIN+CKTOT+MB+TROPI) - Abnormal; Notable for the following:    Total CK 238 (*)    CK, MB 10.0 (*)    Troponin I 5.19 (*)    Relative Index 4.2 (*)    All other components within normal limits  PRO B NATRIURETIC PEPTIDE - Abnormal; Notable for the following:    Pro B Natriuretic peptide (BNP) >70000.0 (*)    All other components within normal limits  URINE MICROSCOPIC-ADD ON - Abnormal; Notable for the following:    Squamous Epithelial / LPF FEW (*)    Bacteria, UA FEW (*)    All other components within normal limits   Dg Chest 2 View  11/15/2011  *RADIOLOGY REPORT*  Clinical Data: Shortness of breath.  History of hypertension. Diabetes.  History of abnormal EKG.  CHEST - 2 VIEW  Comparison: 11/28/2007.  Findings: There is stable moderate cardiac silhouette enlargement. Mediastinal and  hilar contours appear stable.  Chronic increase in basilar reticular pulmonary markings appears stable.  No consolidation or pleural effusion is evident.  There is osteopenic appearance of the bones.  Ectasia, tortuosity, and calcifications of the aorta are seen.  IMPRESSION: Moderate cardiac silhouette enlargement.  No pulmonary edema, consolidation, or pleural effusion.  Chronic nonspecific increase in basilar reticular markings are seen.  This may be associated with fibrosis.  No acute superimposed process evident.  Original Report Authenticated By: Crawford Givens, M.D.     1. Non-STEMI (non-ST elevated myocardial infarction)      Date: 11/15/2011  Rate: 69  Rhythm: normal sinus rhythm  QRS Axis: normal  Intervals: normal  ST/T Wave abnormalities: ST depressions laterally  Conduction Disutrbances:none  Narrative Interpretation:   Old EKG Reviewed: none available    MDM  [facility] to see in ED and admit        Loren Racer, MD 11/15/11 Ernestina Columbia  Loren Racer, MD 11/15/11 [phone]

## 2011-11-15 NOTE — ED Notes (Signed)
[id number] Ready

## 2011-11-15 NOTE — ED Notes (Signed)
Pt states that since Monday she has been SOB. Pt states that she went to her PCP and after an EKG they sent her here. Pt denies pain. Pt was experiencing chest pain that resolved on Monday, but was continuing to experience SOB.

## 2011-11-15 NOTE — Progress Notes (Signed)
ANTICOAGULATION CONSULT NOTE - Initial Consult  Pharmacy Consult for heparin  Indication: chest pain/ACS  Allergies  Allergen Reactions  . Codeine Other (See Comments)    unknown    Patient Measurements: Height: 5\' 6"  (167.6 cm) IBW/kg (Calculated) : 59.3  Heparin Dosing Weight: 76kg  Vital Signs: Temp: 98.5 F (36.9 C) (02/20 1638) Temp src: Oral (02/20 1638) BP: 103/57 mmHg (02/20 1733) Pulse Rate: 68  (02/20 1733)  Labs:  Basename 11/15/11 1730 11/15/11 1714  HGB -- 8.9*  HCT -- 27.2*  PLT -- 195  APTT -- --  LABPROT -- --  INR -- --  HEPARINUNFRC -- --  CREATININE -- 3.57*  CKTOTAL 238* --  CKMB 10.0* --  TROPONINI 5.19* --   The CrCl is unknown because both a height and weight (above a minimum accepted value) are required for this calculation.  Medical History: Past Medical History  Diagnosis Date  . IDDM (insulin dependent diabetes mellitus)   . CAD (coronary artery disease)     a. 08/2005 - NSTEMI - Cath: Severe diffuse 3vd w/ NL EF - poor revasc candidate.;  b. 07/2012 Myoview - large area of ant, dist inf, & apical infarct w/ mild to mod peri-inf ischemia.  transient ischemic dilatation of the LV cavity  . Neuropathy   . Ulcer of foot     a. bilat feet - followed by Dr. Arbie Cookey & [facility] clinic  . Hypertension   . DJD (degenerative joint disease)   . Cataracts, bilateral 07/15/10  . Hyperlipidemia   . CKD (chronic kidney disease) stage 4, GFR 15-29 ml/min     a. s/p AVG  . GERD (gastroesophageal reflux disease)   . Hiatal hernia   . Heart murmur   . Joint pain   . Dizziness   . Carotid artery occlusion     a. s/p left CEA 06/2007;  b. 07/2011 u/s - RICA 1-39%, LICA patent  . Diastolic CHF, chronic   . History of stroke   . Iron deficiency anemia   . History of retinal detachment     a. right eye - repair 11/2007  . History of osteomyelitis     a. Right foot  . PVD (peripheral vascular disease)     a. ABI's 06/2011: R-37, L - 84       Assessment: 65 YOF presenting with SOB and chest pain.  She has h/o significant 3V CAD being medically managed was poor re-vascularization candidate. CE are elevated c/w NSTEMI.  Cardiology ordering heparin per pharmacy.  Hgb is 8.9, plt = 195, ASA given in ED.  BGs uncontrolled in ED.   Goal of Therapy:  Heparin level 0.3-0.7 units/ml   Plan:  1. Heparin 4000 unit bolus then heparin 900 units/hr 2. Check 8hr heparin level 3. Daily heparin level and CBC.   Dannielle Huh 11/15/2011,6:39 PM

## 2011-11-15 NOTE — ED Notes (Signed)
Family at bedside. 

## 2011-11-16 ENCOUNTER — Encounter (HOSPITAL_COMMUNITY): Payer: Medicare Other

## 2011-11-16 ENCOUNTER — Other Ambulatory Visit: Payer: Self-pay

## 2011-11-16 DIAGNOSIS — I517 Cardiomegaly: Secondary | ICD-10-CM

## 2011-11-16 DIAGNOSIS — I5033 Acute on chronic diastolic (congestive) heart failure: Secondary | ICD-10-CM

## 2011-11-16 DIAGNOSIS — I509 Heart failure, unspecified: Secondary | ICD-10-CM

## 2011-11-16 DIAGNOSIS — I214 Non-ST elevation (NSTEMI) myocardial infarction: Secondary | ICD-10-CM

## 2011-11-16 LAB — CBC
HCT: 26.4 % — ABNORMAL LOW (ref 36.0–46.0)
Hemoglobin: 8.8 g/dL — ABNORMAL LOW (ref 12.0–15.0)
MCH: 30.1 pg (ref 26.0–34.0)
MCHC: 33.3 g/dL (ref 30.0–36.0)
MCV: 90.4 fL (ref 78.0–100.0)
RDW: 14.8 % (ref 11.5–15.5)

## 2011-11-16 LAB — LIPID PANEL
Cholesterol: 113 mg/dL (ref 0–200)
HDL: 21 mg/dL — ABNORMAL LOW (ref >39)
Triglycerides: 98 mg/dL (ref <150)
VLDL: 20 mg/dL (ref 0–40)

## 2011-11-16 LAB — GLUCOSE, CAPILLARY
Glucose-Capillary: 473 mg/dL — ABNORMAL HIGH (ref 70–99)
Glucose-Capillary: 260 mg/dL — ABNORMAL HIGH (ref 70–99)
Glucose-Capillary: 275 mg/dL — ABNORMAL HIGH (ref 70–99)

## 2011-11-16 LAB — CARDIAC PANEL(CRET KIN+CKTOT+MB+TROPI)
CK, MB: 9.7 ng/mL (ref 0.3–4.0)
CK, MB: 8 ng/mL (ref 0.3–4.0)
Troponin I: 5.81 ng/mL (ref <0.30)

## 2011-11-16 LAB — BASIC METABOLIC PANEL
BUN: 59 mg/dL — ABNORMAL HIGH (ref 6–23)
Calcium: 9.1 mg/dL (ref 8.4–10.5)
Creatinine, Ser: 3.75 mg/dL — ABNORMAL HIGH (ref 0.50–1.10)
GFR calc non Af Amer: 12 mL/min — ABNORMAL LOW (ref >90)
Glucose, Bld: 242 mg/dL — ABNORMAL HIGH (ref 70–99)
Potassium: 3.9 mEq/L (ref 3.5–5.1)

## 2011-11-16 LAB — HEPARIN LEVEL (UNFRACTIONATED): Heparin Unfractionated: 0.11 IU/mL — ABNORMAL LOW (ref 0.30–0.70)

## 2011-11-16 MED ORDER — HEPARIN BOLUS VIA INFUSION
2000.0000 [IU] | Freq: Once | INTRAVENOUS | Status: AC
  Administered 2011-11-16: 2000 [IU] via INTRAVENOUS
  Filled 2011-11-16: qty 2000

## 2011-11-16 MED ORDER — HEART ATTACK BOUNCING BOOK
Freq: Once | Status: AC
  Administered 2011-11-16: 08:00:00
  Filled 2011-11-16: qty 1

## 2011-11-16 MED ORDER — HEPARIN SOD (PORCINE) IN D5W 100 UNIT/ML IV SOLN
1450.0000 [IU]/h | INTRAVENOUS | Status: DC
  Administered 2011-11-16: 1450 [IU]/h via INTRAVENOUS
  Filled 2011-11-16 (×2): qty 250

## 2011-11-16 MED ORDER — NITROGLYCERIN 0.4 MG/HR TD PT24
0.4000 mg | MEDICATED_PATCH | TRANSDERMAL | Status: AC
  Filled 2011-11-16: qty 1

## 2011-11-16 MED ORDER — NITROGLYCERIN 0.4 MG/HR TD PT24: 0.4000 mg | MEDICATED_PATCH | Freq: Every day | TRANSDERMAL | Status: DC

## 2011-11-16 NOTE — Progress Notes (Signed)
ANTICOAGULATION CONSULT NOTE - Follow Up  Pharmacy Consult for heparin  Indication: chest pain/ACS  Allergies  Allergen Reactions  . Codeine Other (See Comments)    unknown    Patient Measurements: Height: 5\' 6"  (167.6 cm) Weight: 179 lb 14.3 oz (81.6 kg) IBW/kg (Calculated) : 59.3  Heparin Dosing Weight: 76kg  Vital Signs: Temp: 97.9 F (36.6 C) (02/21 1132) Temp src: Oral (02/21 1132) BP: 86/54 mmHg (02/21 1132) Pulse Rate: 59  (02/21 1132)  Labs:  Basename 11/16/11 1415 11/16/11 1045 11/16/11 0440 11/15/11 2247 11/15/11 1714  HGB -- -- 8.8* -- 8.9*  HCT -- -- 26.4* -- 27.2*  PLT -- -- 189 -- 195  APTT -- -- -- -- --  LABPROT -- -- -- 16.4* --  INR -- -- -- 1.30 --  HEPARINUNFRC 0.17* -- <0.10* -- --  CREATININE -- -- 3.75* -- 3.57*  CKTOTAL -- 177 198* 219* --  CKMB -- 8.0* 9.7* 10.4* --  TROPONINI -- 3.84* 5.81* 6.59* --   Estimated Creatinine Clearance: 16.3 ml/min (by C-G formula based on Cr of 3.75).  Medical History: Past Medical History  Diagnosis Date  . IDDM (insulin dependent diabetes mellitus)   . CAD (coronary artery disease)     a. 08/2005 - NSTEMI - Cath: Severe diffuse 3vd w/ NL EF - poor revasc candidate.;  b. 07/2012 Myoview - large area of ant, dist inf, & apical infarct w/ mild to mod peri-inf ischemia.  transient ischemic dilatation of the LV cavity  . Neuropathy   . Ulcer of foot     a. R foot - followed by Dr. Arbie Cookey & [facility]  . Hypertension   . DJD (degenerative joint disease)   . Cataracts, bilateral 07/15/10  . Hyperlipidemia   . CKD (chronic kidney disease) stage 4, GFR 15-29 ml/min     a. s/p AVG  . GERD (gastroesophageal reflux disease)   . Hiatal hernia   . Heart murmur   . Joint pain   . Dizziness   . Carotid artery occlusion     a. s/p left CEA 06/2007;  b. 07/2011 u/s - RICA 1-39%, LICA patent  . Diastolic CHF, chronic   . History of stroke   . Iron deficiency anemia   . History of retinal detachment     a. right eye - repair 11/2007  . History of osteomyelitis     a. Right foot  . PVD (peripheral vascular disease)     a. ABI's 06/2011: R-37, L - 84      Assessment: 66 yo female on IV heparin for NSTEMI.  Anticoagulation: NSTEMI, Heparin level 0.17 after bolus and increase to 1200 units/hr. Hgb 8.8 (low but stable), plts ok. No bleeding reported.   Infectious Disease: Afebrile, wbc wnl. No antibiotics.  Cardiovascular: BP low-stable, no pressors. HR 59- on IV heparin, plavix, ASA, lasix, hydralazine, metoprolol, statin.  Endocrinology: SSI and lantus BID; CBGs 260s- Follow-up CBGs, may need increased coverage. Gastrointestinal / Nutrition: PPI po, Carb Modified diet Neurology: Neurontin Nephrology: CKD Stage 4-5; SCr 3.75/estCrCl~16.3, UOP-unable to determine from documentation, K 3.9, No dialysis. Crestor 20mg  currently-- recommend no more than 10mg  or switch to another agent.  Pulmonary: 99% on 2L [place].  Hematology / Oncology: Hgb 8.8 (low, stable), Plts 189. No bleeding noted.  PTA Medication Issues: Addressed Best Practices: IV Heparin, PPI po   Goal of Therapy:  Heparin level 0.3-0.7 units/ml   Plan:  1. Heparin IV bolus of 2000 units,  then increase IV infusion to 1450 units/hr.  2. Heparin level in 8 hours.   Fayne Norrie 11/16/2011,3:30 PM

## 2011-11-16 NOTE — Progress Notes (Signed)
    Subjective:  Breathing better. No chest pain.  Objective:  Vital Signs in the last 24 hours: Temp:  [97.8 F (36.6 C)-99.1 F (37.3 C)] 99.1 F (37.3 C) (02/21 0757) Pulse Rate:  [59-69] 62  (02/21 0757) Resp:  [16-23] 16  (02/21 0757) BP: (95-121)/(51-77) 113/58 mmHg (02/21 0757) SpO2:  [96 %-100 %] 97 % (02/21 0757) Weight:  [81.6 kg (179 lb 14.3 oz)] 81.6 kg (179 lb 14.3 oz) (02/21 0415)  Intake/Output from previous day: 02/20 0701 - 02/21 0700 In: 223.8 [I.V.:215.8; IV Piggyback:8] Out: -   Physical Exam: Pt is alert and oriented, NAD HEENT: normal Neck: JVP - normal Lungs: mild bibasilar rales bilaterally with overall good air movement CV: RRR without murmur or gallop Abd: soft, NT, Positive BS, no hepatomegaly Ext: no C/C/E Skin: warm/dry no rash   Lab Results:  Basename 11/16/11 0440 11/15/11 1714  WBC 9.3 11.2*  HGB 8.8* 8.9*  PLT 189 195    Basename 11/16/11 0440 11/15/11 1714  NA 135 127*  K 3.9 4.2  CL 101 93*  CO2 22 20  GLUCOSE 242* 477*  BUN 59* 55*  CREATININE 3.75* 3.57*    Basename 11/16/11 0440 11/15/11 2247  TROPONINI 5.81* 6.59*    Cardiac Studies: 2D echo: pending  Tele: sinus rhythm  Assessment/Plan:  1. NSTEMI: 2 day h/o doe, orthopnea and c/p 2 evenings ago. CE abnl. Known severe 3vd along with diast chf. With flat cardiac enzyme trend, suspect this is related to CHF with background of known severe 3 V CAD. I reviewed her prior cath records showing severe left main and 3 vessel CAD. She had a cardiac surgery consult and med Rx was recommended because of poor surgical targets. I would favor medical therapy as I don't think she has revascularization options and risk of CIN leading to end-stage renal disease is significant. 2. Acute Diast CHF: 2 days of doe and orthopnea. Evidence of volume overload on exam. Continue diuresis and await 2D echo. 3. CKD 4-5: stable 4. DM: Cont home regimen. Add ssi.  5. HTN: Stable.  6. HL: Cont  statin.  Tonny Bollman, M.D. 11/16/2011, 9:42 AM

## 2011-11-16 NOTE — Progress Notes (Signed)
ANTICOAGULATION CONSULT NOTE - Follow Up  Pharmacy Consult for heparin  Indication: chest pain/ACS  Allergies  Allergen Reactions  . Codeine Other (See Comments)    unknown    Patient Measurements: Height: 5\' 6"  (167.6 cm) Weight: 179 lb 14.3 oz (81.6 kg) IBW/kg (Calculated) : 59.3  Heparin Dosing Weight: 76kg  Vital Signs: Temp: 97.7 F (36.5 C) (02/21 2013) Temp src: Oral (02/21 2013) BP: 140/68 mmHg (02/21 2216) Pulse Rate: 65  (02/21 2216)  Labs:  Basename 11/16/11 2225 11/16/11 1415 11/16/11 1045 11/16/11 0440 11/15/11 2247 11/15/11 1714  HGB -- -- -- 8.8* -- 8.9*  HCT -- -- -- 26.4* -- 27.2*  PLT -- -- -- 189 -- 195  APTT -- -- -- -- -- --  LABPROT -- -- -- -- 16.4* --  INR -- -- -- -- 1.30 --  HEPARINUNFRC 0.11* 0.17* -- <0.10* -- --  CREATININE -- -- -- 3.75* -- 3.57*  CKTOTAL -- -- 177 198* 219* --  CKMB -- -- 8.0* 9.7* 10.4* --  TROPONINI -- -- 3.84* 5.81* 6.59* --   Estimated Creatinine Clearance: 16.3 ml/min (by C-G formula based on Cr of 3.75).  Assessment: 66 yo female on IV heparin for NSTEMI.  Spoke with RN and infusion found to be off.  Reason or duration unclear.   Goal of Therapy:  Heparin level 0.3-0.7 units/ml   Plan:  Heparin IV bolus of 2000 units, then continue 1450 units/hr.  Follow-up am labs.   Demonie Kassa, Gary Fleet 11/16/2011,11:42 PM

## 2011-11-16 NOTE — Progress Notes (Signed)
Echocardiogram 2D Echocardiogram has been performed.  Kalandra Masters Nira Retort 11/16/2011, 9:52 AM

## 2011-11-16 NOTE — Progress Notes (Signed)
UR Completed. Simmons, Lawton Dollinger F [phone]  

## 2011-11-16 NOTE — Progress Notes (Signed)
ANTICOAGULATION CONSULT NOTE - Follow Up  Pharmacy Consult for heparin  Indication: chest pain/ACS  Allergies  Allergen Reactions  . Codeine Other (See Comments)    unknown    Patient Measurements: Height: 5\' 6"  (167.6 cm) Weight: 179 lb 14.3 oz (81.6 kg) IBW/kg (Calculated) : 59.3  Heparin Dosing Weight: 76kg  Vital Signs: Temp: 98 F (36.7 C) (02/21 0521) Temp src: Oral (02/21 0521) BP: 112/63 mmHg (02/21 0521) Pulse Rate: 63  (02/21 0521)  Labs:  Basename 11/16/11 0440 11/15/11 2247 11/15/11 1730 11/15/11 1714  HGB 8.8* -- -- 8.9*  HCT 26.4* -- -- 27.2*  PLT 189 -- -- 195  APTT -- -- -- --  LABPROT -- 16.4* -- --  INR -- 1.30 -- --  HEPARINUNFRC <0.10* -- -- --  CREATININE 3.75* -- -- 3.57*  CKTOTAL 198* 219* 238* --  CKMB 9.7* 10.4* 10.0* --  TROPONINI 5.81* 6.59* 5.19* --   Estimated Creatinine Clearance: 16.3 ml/min (by C-G formula based on Cr of 3.75).  Medical History: Past Medical History  Diagnosis Date  . IDDM (insulin dependent diabetes mellitus)   . CAD (coronary artery disease)     a. 08/2005 - NSTEMI - Cath: Severe diffuse 3vd w/ NL EF - poor revasc candidate.;  b. 07/2012 Myoview - large area of ant, dist inf, & apical infarct w/ mild to mod peri-inf ischemia.  transient ischemic dilatation of the LV cavity  . Neuropathy   . Ulcer of foot     a. R foot - followed by Dr. Arbie Cookey & [facility] W[facility] clinic  . Hypertension   . DJD (degenerative joint disease)   . Cataracts, bilateral 07/15/10  . Hyperlipidemia   . CKD (chronic kidney disease) stage 4, GFR 15-29 ml/min     a. s/p AVG  . GERD (gastroesophageal reflux disease)   . Hiatal hernia   . Heart murmur   . Joint pain   . Dizziness   . Carotid artery occlusion     a. s/p left CEA 06/2007;  b. 07/2011 u/s - RICA 1-39%, LICA patent  . Diastolic CHF, chronic   . History of stroke   . Iron deficiency anemia   . History of retinal detachment     a. right eye - repair 11/2007  . History of  osteomyelitis     a. Right foot  . PVD (peripheral vascular disease)     a. ABI's 06/2011: R-37, L - 84      Assessment: 67 yo female on IV heparin for NSTEMI. Heparin level (< 0.1) is below-goal on 900 units/hr. No problem with line per RN.   Goal of Therapy:  Heparin level 0.3-0.7 units/ml   Plan:  1. Heparin IV bolus of 2000 units, then increase IV infusion to 1200 units/hr.  2. Heparin level in 8 hours.   [name] 11/16/2011,6:18 AM

## 2011-11-17 LAB — CBC
HCT: 27.3 % — ABNORMAL LOW (ref 36.0–46.0)
Hemoglobin: 8.8 g/dL — ABNORMAL LOW (ref 12.0–15.0)
MCH: 29.3 pg (ref 26.0–34.0)
MCHC: 32.2 g/dL (ref 30.0–36.0)
MCV: 91 fL (ref 78.0–100.0)
Platelets: 210 10*3/uL (ref 150–400)
RBC: 3 MIL/uL — ABNORMAL LOW (ref 3.87–5.11)
RDW: 15 % (ref 11.5–15.5)
WBC: 10.2 10*3/uL (ref 4.0–10.5)

## 2011-11-17 LAB — IRON AND TIBC
Iron: 37 ug/dL — ABNORMAL LOW (ref 42–135)
Saturation Ratios: 21 % (ref 20–55)
TIBC: 179 ug/dL — ABNORMAL LOW (ref 250–470)
UIBC: 142 ug/dL (ref 125–400)

## 2011-11-17 LAB — BASIC METABOLIC PANEL WITH GFR
BUN: 63 mg/dL — ABNORMAL HIGH (ref 6–23)
CO2: 22 meq/L (ref 19–32)
Calcium: 9.6 mg/dL (ref 8.4–10.5)
Chloride: 99 meq/L (ref 96–112)
Creatinine, Ser: 4.14 mg/dL — ABNORMAL HIGH (ref 0.50–1.10)
GFR calc Af Amer: 12 mL/min — ABNORMAL LOW
GFR calc non Af Amer: 10 mL/min — ABNORMAL LOW
Glucose, Bld: 208 mg/dL — ABNORMAL HIGH (ref 70–99)
Potassium: 4.1 meq/L (ref 3.5–5.1)
Sodium: 134 meq/L — ABNORMAL LOW (ref 135–145)

## 2011-11-17 LAB — GLUCOSE, CAPILLARY
Glucose-Capillary: 196 mg/dL — ABNORMAL HIGH (ref 70–99)
Glucose-Capillary: 155 mg/dL — ABNORMAL HIGH (ref 70–99)
Glucose-Capillary: 131 mg/dL — ABNORMAL HIGH (ref 70–99)

## 2011-11-17 LAB — HEPARIN LEVEL (UNFRACTIONATED): Heparin Unfractionated: 0.37 IU/mL (ref 0.30–0.70)

## 2011-11-17 MED ORDER — DARBEPOETIN ALFA-POLYSORBATE 200 MCG/0.4ML IJ SOLN
200.0000 ug | Freq: Once | INTRAMUSCULAR | Status: AC
  Administered 2011-11-17: 200 ug via SUBCUTANEOUS
  Filled 2011-11-17 (×2): qty 0.4

## 2011-11-17 MED ORDER — METOPROLOL SUCCINATE ER 50 MG PO TB24
50.0000 mg | ORAL_TABLET | Freq: Every day | ORAL | Status: DC
  Administered 2011-11-17 – 2011-11-18 (×2): 50 mg via ORAL
  Filled 2011-11-17 (×3): qty 1

## 2011-11-17 MED ORDER — ISOSORBIDE MONONITRATE ER 30 MG PO TB24
30.0000 mg | ORAL_TABLET | Freq: Every day | ORAL | Status: DC
  Administered 2011-11-17 – 2011-11-22 (×6): 30 mg via ORAL
  Filled 2011-11-17 (×6): qty 1

## 2011-11-17 MED ORDER — HYDRALAZINE HCL 10 MG PO TABS
10.0000 mg | ORAL_TABLET | Freq: Three times a day (TID) | ORAL | Status: DC
  Administered 2011-11-17 – 2011-11-22 (×15): 10 mg via ORAL
  Filled 2011-11-17 (×17): qty 1

## 2011-11-17 MED ORDER — FUROSEMIDE 80 MG PO TABS
160.0000 mg | ORAL_TABLET | Freq: Three times a day (TID) | ORAL | Status: DC
  Administered 2011-11-17 – 2011-11-22 (×15): 160 mg via ORAL
  Filled 2011-11-17 (×18): qty 2

## 2011-11-17 NOTE — Progress Notes (Signed)
Patient Name: [name] Date of Encounter: 11/17/2011     Principal Problem:  *NSTEMI (non-ST elevated myocardial infarction) Active Problems:  DIABETES MELLITUS, TYPE II  HYPERLIPIDEMIA  ANEMIA, CHRONIC  HYPERTENSION, UNSPECIFIED  CAD  PERIPHERAL VASCULAR DISEASE  GERD  Diastolic CHF, acute on chronic  CKD (chronic kidney disease) stage 4, GFR 15-29 ml/min    SUBJECTIVE  Orthopnea and slept more or less lying flat last night.  She did get up around 4am sob however, and sat @ the bedside for a little while.   CURRENT MEDS    . aspirin EC  81 mg Oral Daily  . calcitRIOL  0.25 mcg Oral Daily  . clopidogrel  75 mg Oral Q breakfast  . famotidine  40 mg Oral Daily  . furosemide  80 mg Intravenous BID  . gabapentin  100 mg Oral TID  . heparin  2,000 Units Intravenous Once  . heparin  2,000 Units Intravenous Once  . hydrALAZINE  50 mg Oral TID  . insulin aspart  0-15 Units Subcutaneous TID WC  . insulin glargine  25 Units Subcutaneous Daily  . insulin glargine  30 Units Subcutaneous QHS  . metoprolol tartrate  25 mg Oral BID  . nitroGLYCERIN  0.4 mg Transdermal NOW  . pantoprazole  40 mg Oral Q1200  . rosuvastatin  20 mg Oral q1800  . sodium chloride  3 mL Intravenous Q12H  . DISCONTD: aspirin  324 mg Oral NOW  . DISCONTD: aspirin  300 mg Rectal NOW  . DISCONTD: insulin aspart  0-15 Units Subcutaneous TID WC    OBJECTIVE  Filed Vitals:   11/16/11 2013 11/16/11 2216 11/17/11 0204 11/17/11 0427  BP: 135/71 140/68  110/65  Pulse: 65 65  59  Temp: 97.7 F (36.5 C)   96.5 F (35.8 C)  TempSrc: Oral   Oral  Resp: 18   18  Height:      Weight:   179 lb 12.8 oz (81.557 kg)   SpO2: 96%   96%    Intake/Output Summary (Last 24 hours) at 11/17/11 0810 Last data filed at 11/17/11 0208  Gross per 24 hour  Intake    606 ml  Output    600 ml  Net      6 ml   Filed Weights   11/15/11 2244 11/16/11 0415 11/17/11 0204  Weight: 179 lb 14.3 oz (81.6 kg) 179 lb 14.3  oz (81.6 kg) 179 lb 12.8 oz (81.557 kg)    PHYSICAL EXAM  General: Pleasant, NAD. Neuro: Alert and oriented X 3. Moves all extremities spontaneously. Psych: Normal affect. HEENT:  Normal  Neck: Supple without bruits.  JVP still moderately elevated. Lungs:  Resp regular and unlabored, bibasilar crackles. Heart: RRR no s3, s4, or murmurs. Abdomen: Soft, non-tender, non-distended, BS + x 4.  Extremities: No clubbing, cyanosis.  2+ edema (worse than on admission). DP/PT/Radials 2+ and equal bilaterally.  Accessory Clinical Findings  CBC  Basename 11/17/11 0525 11/16/11 0440 11/15/11 1714  WBC 10.2 9.3 --  NEUTROABS -- -- 9.5*  HGB 8.8* 8.8* --  HCT 27.3* 26.4* --  MCV 91.0 90.4 --  PLT 210 189 --   Basic Metabolic Panel  Basename 11/17/11 0525 11/16/11 0440 11/15/11 2247  NA 134* 135 --  K 4.1 3.9 --  CL 99 101 --  CO2 22 22 --  GLUCOSE 208* 242* --  BUN 63* 59* --  CREATININE 4.14* 3.75* --  CALCIUM 9.6 9.1 --  MG -- -- 2.2  PHOS -- -- --   Liver Function Tests  [facility] 11/15/11 1714  AST 18  ALT 9  ALKPHOS 59  BILITOT 0.9  PROT 6.6  ALBUMIN 3.2*   Cardiac Enzymes  Basename 11/16/11 1045 11/16/11 0440 11/15/11 2247  CKTOTAL 177 198* 219*  CKMB 8.0* 9.7* 10.4*  CKMBINDEX -- -- --  TROPONINI 3.84* 5.81* 6.59*   Fasting Lipid Panel  Basename 11/16/11 0440  CHOL 113  HDL 21*  LDLCALC 72  TRIG 98  CHOLHDL 5.4  LDLDIRECT --   Thyroid Function Tests  Basename 11/15/11 2247  TSH 3.427  T4TOTAL --  T3FREE --  THYROIDAB --   2d Echo 11/16/2011 Study Conclusions  - Left ventricle: Hypokinesis mid/apical inferior and mid/apical inferoseptal. Inferolateral wall not seen well. The cavity size was normal. Wall thickness was increased in a pattern of mild LVH. Systolic function was mildly reduced. The estimated ejection fraction was in the range of 45% to 50%. Doppler parameters are consistent with high ventricular filling pressure. - Right atrium:  The atrium was mildly dilated.   TELE  Sinus rhythm/brady, 1st deg avb  ASSESSMENT AND PLAN  1.  Acute on chronic mixed syst/diast chf/ICM:  She is breathing better, with less orthopnea, though still had a spell of PND earlier this am.  In looking @ her I/O's and weights, she really hasn't responded to IV lasix much.  Her BUN and creat have risen again though her neck veins remain elevated, suggesting that she still has excess volume.  Further, Echo yesterday suggested elevated filling pressures (EF 45-50%).  Will discuss diuretic adjustments with Dr. Excell Seltzer.  Likely need to ask renal for guidance.  Cont BB (change to long acting), hydralazine/ntg.  No acei/arb 2/2 renal failure.    2.  NSTEMI/CAD:  No chest pain.  Flat troponin trend- likely 2/2 chf/renal failure.  Known, severe, multivessel dzs with poor distal targets/revasc options.  With renal failure and risk of CIN, cont med rx - asa, plavix, bb, statin.  Change nitrate to PO.  3.  Stage IV-V CKD:  See above.  Bun/creat up this am.  Hold lasix this am.  Pt on torsemide @ home.  Will d/w Dr. Excell Seltzer.  4.  DM:  Cont current regimen.  5.  HL:  Cont statin.  LDL = 72.  6.  Anemia (normocytic):  Stable.  Receives outpt procrit inj.   Signed, Nicolasa Ducking NP  Patient seen, examined. Available data reviewed. Agree with findings, assessment, and plan as outlined by Ward Givens, NP. I discussed the patient's case with Dr Darrick Penna from the Renal team and appreciate his consult and management of this patient. Agree that progressive renal dysfunction and mixed CHF are the primary problems, with demand ischemia from severe diffuse CAD causing Type 2 NSTEMI. Will continue with med Rx and follow her response to high-dose diuretic Rx.  Tonny Bollman, M.D. 11/17/2011 4:09 PM

## 2011-11-17 NOTE — Consult Note (Signed)
Reason for Consult:CKD 4 with worsening Referring Physician: Dr. Mayer Masker is an 66 y.o. female.  HPI: 66 yr old female with hx Stage 4 CKD (Cr 3.5 in 2/13), now admitted with PND, ,orthop, assoc with CP.  This was acute and not chronic or progressive.  Notes ^ ankle edema assoc with this.  Has nocturia depending on fluid intake and time of diuretics.  No fever, chills, cough, N,V,D, ms cramps or itching.  Now gets SOB with mild exertion or setting still.      Has clotted AVG from 2009 with plans to place near to dialysis.  On EPO for anemia, & Vit D for ^ PTH. Sees Dr. Kathrene Bongo, last seen12/12.  ROS Some Decrease in Vision No cough or Wheezing NO GI sx, Neuro L Leg weakness, walks with walker Skin neg   Past Medical History  Diagnosis Date  . IDDM (insulin dependent diabetes mellitus)   . CAD (coronary artery disease)     a. 08/2005 - NSTEMI - Cath: Severe diffuse 3vd w/ NL EF - poor revasc candidate.;  b. 07/2012 Myoview - large area of ant, dist inf, & apical infarct w/ mild to mod peri-inf ischemia.  transient ischemic dilatation of the LV cavity  . Neuropathy   . Ulcer of foot     a. R foot - followed by Dr. Ar[facility]  . Hypertension   . DJD (degenerative joint disease)   . Cataracts, bilateral 07/15/10  . Hyperlipidemia   . CKD (chronic kidney disease) stage 4, GFR 15-29 ml/min     a. s/p AVG  . GERD (gastroesophageal reflux disease)   . Hiatal hernia   . Heart murmur   . Joint pain   . Dizziness   . Carotid artery occlusion     a. s/p left CEA 06/2007;  b. 07/2011 u/s - RICA 1-39%, LICA patent  . Diastolic CHF, chronic   . History of stroke   . Iron deficiency anemia   . History of retinal detachment     a. right eye - repair 11/2007  . History of osteomyelitis     a. Right foot  . PVD (peripheral vascular disease)     a. ABI's 06/2011: R-37, L - 84     Past Surgical History  Procedure Date  . Cataract extraction   .  Arteriovenous graft placement 2009  . Carotid endarterectomy 2008    Family History  Problem Relation Age of Onset  . Hypertension Other   . Stroke Other   . Heart attack Other   . Cancer Other   . Heart disease Other   . Diabetes Mother   . Cancer Mother     lung - died 107  . Cirrhosis Father     died 25  . Cancer Sister     liver  . Cancer Son     lymphatic    Social History:  reports that she has quit smoking. Her smoking use included Cigarettes. She does not have any smokeless tobacco history on file. She reports that she drinks alcohol. She reports that she does not use illicit drugs.  Allergies:  Allergies  Allergen Reactions  . Codeine Other (See Comments)    unknown    Medications:  I have reviewed the patient's current medications. Prior to Admission:  Prescriptions prior to admission  Medication Sig Dispense Refill  . aspirin (ASPIRIN LOW DOSE) 81 MG EC tablet Take 81 mg by mouth daily.        Marland Kitchen  Blood Glucose Monitoring Suppl (PRODIGY AUTOCODE BLOOD GLUCOSE) W/DEVICE KIT by Does not apply route 3 (three) times daily.        . calcitRIOL (ROCALTROL) 0.25 MCG capsule Take 0.25 mcg by mouth daily.        . clopidogrel (PLAVIX) 75 MG tablet Take 75 mg by mouth daily.        . famotidine (PEPCID) 40 MG tablet Take 40 mg by mouth daily.        . Foot Care Products [facility] FOR HER OPEN SHOE) MISC by Does not apply route. Patient has Rx for diabetic shoes         . Furosemide (LASIX PO) Take 100 mg by mouth 2 (two) times daily.        Marland Kitchen gabapentin (NEURONTIN) 100 MG tablet Take 100 mg by mouth 3 (three) times daily.        . Glucose Blood KIT by In Vitro route.        Marland Kitchen glucose blood test strip 1 each by Other route 3 (three) times daily. Use as instructed       . hydrALAZINE (APRESOLINE) 50 MG tablet Take 50 mg by mouth 3 (three) times daily.        . insulin aspart (NOVOLOG) 100 UNIT/ML injection Inject 4-12 Units into the skin 3 (three) times daily before meals.  Sliding scale        . insulin glargine (LANTUS) 100 UNIT/ML injection Inject 25-30 Units into the skin 2 (two) times daily. Inject 30 units in the am and 25 units in the pm daily        . Lancets MISC by Does not apply route 3 (three) times daily.        . metoprolol tartrate (LOPRESSOR) 25 MG tablet Take 25 mg by mouth 2 (two) times daily.        . pantoprazole (PROTONIX) 40 MG tablet Take 40 mg by mouth daily.        . potassium chloride SA (K-DUR,KLOR-CON) 20 MEQ tablet Take 20 mEq by mouth daily as needed. For cramping      . pravastatin (PRAVACHOL) 40 MG tablet Take 40 mg by mouth daily.        Marland Kitchen torsemide (DEMADEX) 100 MG tablet Take 100 mg by mouth 2 (two) times daily.        , Calcitriol .25 mcg qod.Procrit 78295 USQ q 2wk   Results for orders placed during the hospital encounter of 11/15/11 (from the past 48 hour(s))  URINALYSIS, ROUTINE W REFLEX MICROSCOPIC     Status: Abnormal   Collection Time   11/15/11  5:01 PM      Component Value Range Comment   Color, Urine YELLOW  YELLOW     APPearance CLOUDY (*) CLEAR     Specific Gravity, Urine 1.015  1.005 - 1.030     pH 5.5  5.0 - 8.0     Glucose, UA >1000 (*) NEGATIVE (mg/dL)    Hgb urine dipstick NEGATIVE  NEGATIVE     Bilirubin Urine NEGATIVE  NEGATIVE     Ketones, ur NEGATIVE  NEGATIVE (mg/dL)    Protein, ur NEGATIVE  NEGATIVE (mg/dL)    Urobilinogen, UA 0.2  0.0 - 1.0 (mg/dL)    Nitrite NEGATIVE  NEGATIVE     Leukocytes, UA SMALL (*) NEGATIVE    URINE MICROSCOPIC-ADD ON     Status: Abnormal   Collection Time   11/15/11  5:01 PM  Component Value Range Comment   Squamous Epithelial / LPF FEW (*) RARE     WBC, UA 7-10  <3 (WBC/hpf)    RBC / HPF 0-2  <3 (RBC/hpf)    Bacteria, UA FEW (*) RARE     Urine-Other FEW YEAST     CBC     Status: Abnormal   Collection Time   11/15/11  5:14 PM      Component Value Range Comment   WBC 11.2 (*) 4.0 - 10.5 (K/uL)    RBC 3.02 (*) 3.87 - 5.11 (MIL/uL)    Hemoglobin 8.9 (*) 12.0  - 15.0 (g/dL)    HCT 82.9 (*) 56.2 - 46.0 (%)    MCV 90.1  78.0 - 100.0 (fL)    MCH 29.5  26.0 - 34.0 (pg)    MCHC 32.7  30.0 - 36.0 (g/dL)    RDW 13.0  86.5 - 78.4 (%)    Platelets 195  150 - 400 (K/uL)   DIFFERENTIAL     Status: Abnormal   Collection Time   11/15/11  5:14 PM      Component Value Range Comment   Neutrophils Relative 84 (*) 43 - 77 (%)    Neutro Abs 9.5 (*) 1.7 - 7.7 (K/uL)    Lymphocytes Relative 8 (*) 12 - 46 (%)    Lymphs Abs 0.9  0.7 - 4.0 (K/uL)    Monocytes Relative 7  3 - 12 (%)    Monocytes Absolute 0.8  0.1 - 1.0 (K/uL)    Eosinophils Relative 1  0 - 5 (%)    Eosinophils Absolute 0.1  0.0 - 0.7 (K/uL)    Basophils Relative 0  0 - 1 (%)    Basophils Absolute 0.0  0.0 - 0.1 (K/uL)   COMPREHENSIVE METABOLIC PANEL     Status: Abnormal   Collection Time   11/15/11  5:14 PM      Component Value Range Comment   Sodium 127 (*) 135 - 145 (mEq/L)    Potassium 4.2  3.5 - 5.1 (mEq/L)    Chloride 93 (*) 96 - 112 (mEq/L)    CO2 20  19 - 32 (mEq/L)    Glucose, Bld 477 (*) 70 - 99 (mg/dL)    BUN 55 (*) 6 - 23 (mg/dL)    Creatinine, Ser 6.96 (*) 0.50 - 1.10 (mg/dL)    Calcium 9.4  8.4 - 10.5 (mg/dL)    Total Protein 6.6  6.0 - 8.3 (g/dL)    Albumin 3.2 (*) 3.5 - 5.2 (g/dL)    AST 18  0 - 37 (U/L)    ALT 9  0 - 35 (U/L)    Alkaline Phosphatase 59  39 - 117 (U/L)    Total Bilirubin 0.9  0.3 - 1.2 (mg/dL)    GFR calc non Af Amer 13 (*) >90 (mL/min)    GFR calc Af Amer 14 (*) >90 (mL/min)   PRO B NATRIURETIC PEPTIDE     Status: Abnormal   Collection Time   11/15/11  5:14 PM      Component Value Range Comment   Pro B Natriuretic peptide (BNP) >70000.0 (*) 0 - 125 (pg/mL)   CARDIAC PANEL(CRET KIN+CKTOT+MB+TROPI)     Status: Abnormal   Collection Time   11/15/11  5:30 PM      Component Value Range Comment   Total CK 238 (*) 7 - 177 (U/L)    CK, MB 10.0 (*) 0.3 - 4.0 (ng/mL)  Troponin I 5.19 (*) <0.30 (ng/mL)    Relative Index 4.2 (*) 0.0 - 2.5    GLUCOSE,  CAPILLARY     Status: Abnormal   Collection Time   11/15/11  9:55 PM      Component Value Range Comment   Glucose-Capillary 473 (*) 70 - 99 (mg/dL)    Comment 1 Documented in Chart      Comment 2 Notify RN     CARDIAC PANEL(CRET KIN+CKTOT+MB+TROPI)     Status: Abnormal   Collection Time   11/15/11 10:47 PM      Component Value Range Comment   Total CK 219 (*) 7 - 177 (U/L)    CK, MB 10.4 (*) 0.3 - 4.0 (ng/mL) CRITICAL VALUE NOTED.  VALUE IS CONSISTENT WITH PREVIOUSLY REPORTED AND CALLED VALUE.   Troponin I 6.59 (*) <0.30 (ng/mL)    Relative Index 4.7 (*) 0.0 - 2.5    PROTIME-INR     Status: Abnormal   Collection Time   11/15/11 10:47 PM      Component Value Range Comment   Prothrombin Time 16.4 (*) 11.6 - 15.2 (seconds)    INR 1.30  0.00 - 1.49    TSH     Status: Normal   Collection Time   11/15/11 10:47 PM      Component Value Range Comment   TSH 3.427  0.350 - 4.500 (uIU/mL)   MAGNESIUM     Status: Normal   Collection Time   11/15/11 10:47 PM      Component Value Range Comment   Magnesium 2.2  1.5 - 2.5 (mg/dL)   CARDIAC PANEL(CRET KIN+CKTOT+MB+TROPI)     Status: Abnormal   Collection Time   11/16/11  4:40 AM      Component Value Range Comment   Total CK 198 (*) 7 - 177 (U/L)    CK, MB 9.7 (*) 0.3 - 4.0 (ng/mL) CRITICAL VALUE NOTED.  VALUE IS CONSISTENT WITH PREVIOUSLY REPORTED AND CALLED VALUE.   Troponin I 5.81 (*) <0.30 (ng/mL)    Relative Index 4.9 (*) 0.0 - 2.5    CBC     Status: Abnormal   Collection Time   11/16/11  4:40 AM      Component Value Range Comment   WBC 9.3  4.0 - 10.5 (K/uL)    RBC 2.92 (*) 3.87 - 5.11 (MIL/uL)    Hemoglobin 8.8 (*) 12.0 - 15.0 (g/dL)    HCT 16.1 (*) 09.6 - 46.0 (%)    MCV 90.4  78.0 - 100.0 (fL)    MCH 30.1  26.0 - 34.0 (pg)    MCHC 33.3  30.0 - 36.0 (g/dL)    RDW 04.5  40.9 - 81.1 (%)    Platelets 189  150 - 400 (K/uL)   BASIC METABOLIC PANEL     Status: Abnormal   Collection Time   11/16/11  4:40 AM      Component Value Range  Comment   Sodium 135  135 - 145 (mEq/L) DELTA CHECK NOTED   Potassium 3.9  3.5 - 5.1 (mEq/L)    Chloride 101  96 - 112 (mEq/L)    CO2 22  19 - 32 (mEq/L)    Glucose, Bld 242 (*) 70 - 99 (mg/dL)    BUN 59 (*) 6 - 23 (mg/dL)    Creatinine, Ser 9.14 (*) 0.50 - 1.10 (mg/dL)    Calcium 9.1  8.4 - 10.5 (mg/dL)    GFR calc non Af Amer 12 (*) >  90 (mL/min)    GFR calc Af Amer 14 (*) >90 (mL/min)   PRO B NATRIURETIC PEPTIDE     Status: Abnormal   Collection Time   11/16/11  4:40 AM      Component Value Range Comment   Pro B Natriuretic peptide (BNP) >70000.0 (*) 0 - 125 (pg/mL)   HEPARIN LEVEL (UNFRACTIONATED)     Status: Abnormal   Collection Time   11/16/11  4:40 AM      Component Value Range Comment   Heparin Unfractionated <0.10 (*) 0.30 - 0.70 (IU/mL)   LIPID PANEL     Status: Abnormal   Collection Time   11/16/11  4:40 AM      Component Value Range Comment   Cholesterol 113  0 - 200 (mg/dL)    Triglycerides 98  <161 (mg/dL)    HDL 21 (*) >09 (mg/dL)    Total CHOL/HDL Ratio 5.4      VLDL 20  0 - 40 (mg/dL)    LDL Cholesterol 72  0 - 99 (mg/dL)   GLUCOSE, CAPILLARY     Status: Abnormal   Collection Time   11/16/11  8:20 AM      Component Value Range Comment   Glucose-Capillary 251 (*) 70 - 99 (mg/dL)    Comment 1 Notify RN     GLUCOSE, CAPILLARY     Status: Abnormal   Collection Time   11/16/11  9:08 AM      Component Value Range Comment   Glucose-Capillary 264 (*) 70 - 99 (mg/dL)    Comment 1 Notify RN     CARDIAC PANEL(CRET KIN+CKTOT+MB+TROPI)     Status: Abnormal   Collection Time   11/16/11 10:45 AM      Component Value Range Comment   Total CK 177  7 - 177 (U/L)    CK, MB 8.0 (*) 0.3 - 4.0 (ng/mL) CRITICAL VALUE NOTED.  VALUE IS CONSISTENT WITH PREVIOUSLY REPORTED AND CALLED VALUE.   Troponin I 3.84 (*) <0.30 (ng/mL)    Relative Index 4.5 (*) 0.0 - 2.5    GLUCOSE, CAPILLARY     Status: Abnormal   Collection Time   11/16/11 12:40 PM      Component Value Range Comment    Glucose-Capillary 260 (*) 70 - 99 (mg/dL)    Comment 1 Notify RN     HEPARIN LEVEL (UNFRACTIONATED)     Status: Abnormal   Collection Time   11/16/11  2:15 PM      Component Value Range Comment   Heparin Unfractionated 0.17 (*) 0.30 - 0.70 (IU/mL)   GLUCOSE, CAPILLARY     Status: Abnormal   Collection Time   11/16/11  5:21 PM      Component Value Range Comment   Glucose-Capillary 275 (*) 70 - 99 (mg/dL)    Comment 1 Notify RN     GLUCOSE, CAPILLARY     Status: Abnormal   Collection Time   11/16/11  9:33 PM      Component Value Range Comment   Glucose-Capillary 281 (*) 70 - 99 (mg/dL)   HEPARIN LEVEL (UNFRACTIONATED)     Status: Abnormal   Collection Time   11/16/11 10:25 PM      Component Value Range Comment   Heparin Unfractionated 0.11 (*) 0.30 - 0.70 (IU/mL)   BASIC METABOLIC PANEL     Status: Abnormal   Collection Time   11/17/11  5:25 AM      Component Value Range Comment  Sodium 134 (*) 135 - 145 (mEq/L)    Potassium 4.1  3.5 - 5.1 (mEq/L)    Chloride 99  96 - 112 (mEq/L)    CO2 22  19 - 32 (mEq/L)    Glucose, Bld 208 (*) 70 - 99 (mg/dL)    BUN 63 (*) 6 - 23 (mg/dL)    Creatinine, Ser 0.45 (*) 0.50 - 1.10 (mg/dL)    Calcium 9.6  8.4 - 10.5 (mg/dL)    GFR calc non Af Amer 10 (*) >90 (mL/min)    GFR calc Af Amer 12 (*) >90 (mL/min)   CBC     Status: Abnormal   Collection Time   11/17/11  5:25 AM      Component Value Range Comment   WBC 10.2  4.0 - 10.5 (K/uL)    RBC 3.00 (*) 3.87 - 5.11 (MIL/uL)    Hemoglobin 8.8 (*) 12.0 - 15.0 (g/dL)    HCT 40.9 (*) 81.1 - 46.0 (%)    MCV 91.0  78.0 - 100.0 (fL)    MCH 29.3  26.0 - 34.0 (pg)    MCHC 32.2  30.0 - 36.0 (g/dL)    RDW 91.4  78.2 - 95.6 (%)    Platelets 210  150 - 400 (K/uL)   HEPARIN LEVEL (UNFRACTIONATED)     Status: Normal   Collection Time   11/17/11  5:25 AM      Component Value Range Comment   Heparin Unfractionated 0.37  0.30 - 0.70 (IU/mL)   GLUCOSE, CAPILLARY     Status: Abnormal   Collection Time    11/17/11  6:10 AM      Component Value Range Comment   Glucose-Capillary 196 (*) 70 - 99 (mg/dL)   GLUCOSE, CAPILLARY     Status: Abnormal   Collection Time   11/17/11 11:42 AM      Component Value Range Comment   Glucose-Capillary 209 (*) 70 - 99 (mg/dL)    Comment 1 Documented in Chart      Comment 2 Notify RN       Dg Chest 2 View  11/15/2011  *RADIOLOGY REPORT*  Clinical Data: Shortness of breath.  History of hypertension. Diabetes.  History of abnormal EKG.  CHEST - 2 VIEW  Comparison: 11/28/2007.  Findings: There is stable moderate cardiac silhouette enlargement. Mediastinal and hilar contours appear stable.  Chronic increase in basilar reticular pulmonary markings appears stable.  No consolidation or pleural effusion is evident.  There is osteopenic appearance of the bones.  Ectasia, tortuosity, and calcifications of the aorta are seen.  IMPRESSION: Moderate cardiac silhouette enlargement.  No pulmonary edema, consolidation, or pleural effusion.  Chronic nonspecific increase in basilar reticular markings are seen.  This may be associated with fibrosis.  No acute superimposed process evident.  Original Report Authenticated By: Crawford Givens, M.D.    @ROS @ Blood pressure 127/70, pulse 62, temperature 96.7 F (35.9 C), temperature source Oral, resp. rate 18, height 5\' 6"  (1.676 m), weight 81.557 kg (179 lb 12.8 oz), SpO2 97.00%. @PHYSEXAMBYAGE2 @ Physical Examination: General appearance - alert, well appearing, and in no distress and sallow skin tone Mental status - alert, oriented to person, place, and time Eyes - Diabetic Retinopathy with scarring Mouth moist mucous memb  Neck - PCL Lymphatics - posterior cervical nodes Chest - rales noted bibasilar, decreased air entry noted bilat Heart - S1 and S2 normal, S4 present, systolic murmur SEM Gr 2/6 at 2nd left intercostal space Abdomen - hepatomegaly live down  5 cm, pos bs,soft, nontender Extremities - pedal edema 3 + Skin - pale,  sallow  Assessment/Plan: 1 CKD 4 now 5, with vol xs, anemia, ^ PTH.  I suspect that this is the worsening of GFR, fluid retention, and demand ischemia.  Suspect may need dialysis soon.  Will try to diurese, allow BP to rise, and see how she does.  Will need access also. 2 CHF  I suspect #1 is the primary issue 3 Hypertension:  Need to allow bp to rise and diuese 4. Anemia of CKD give EPO and check fe 5. Metabolic Bone Disease:  Check PTH 6 PVD new toe ulcer 7 Leg weakness 8 Retinipathy  P lower Hydral, add Lasix in high dose, epo, check fe, unine chem.PTH  Lasaundra Riche L 11/17/2011, 2:22 PM

## 2011-11-17 NOTE — Progress Notes (Signed)
CARDIAC REHAB PHASE I   PRE:  Rate/Rhythm: 56 SB  BP:  Supine:   Sitting: 110/60  Standing:    SaO2: 94% RA  MODE:  Ambulation: 150 ft   POST:  Rate/Rhythem: 68 SR  BP:  Supine:   Sitting: 130/64  Standing:    SaO2: 98% RA Pt ambulated using rolling walker, Pt complained of right leg weakness due to spider bite in 2006. Pt has balance problems and relies on walker heavily. Pt ambulated 150 ft with a break halfway through. Denies SOB, CP. Her SaO2 was 98% RA during and post walk. Pt to recliner with call light in reach.  [id number] [place]

## 2011-11-18 LAB — CBC
HCT: 26.7 % — ABNORMAL LOW (ref 36.0–46.0)
Hemoglobin: 8.8 g/dL — ABNORMAL LOW (ref 12.0–15.0)
MCH: 29.5 pg (ref 26.0–34.0)
MCHC: 33 g/dL (ref 30.0–36.0)
MCV: 89.6 fL (ref 78.0–100.0)
RBC: 2.98 MIL/uL — ABNORMAL LOW (ref 3.87–5.11)

## 2011-11-18 LAB — COMPREHENSIVE METABOLIC PANEL
BUN: 64 mg/dL — ABNORMAL HIGH (ref 6–23)
CO2: 22 mEq/L (ref 19–32)
Calcium: 10 mg/dL (ref 8.4–10.5)
Creatinine, Ser: 3.91 mg/dL — ABNORMAL HIGH (ref 0.50–1.10)
GFR calc Af Amer: 13 mL/min — ABNORMAL LOW (ref >90)
GFR calc non Af Amer: 11 mL/min — ABNORMAL LOW (ref >90)
Glucose, Bld: 67 mg/dL — ABNORMAL LOW (ref 70–99)

## 2011-11-18 LAB — GLUCOSE, CAPILLARY: Glucose-Capillary: 87 mg/dL (ref 70–99)

## 2011-11-18 MED ORDER — FERUMOXYTOL INJECTION 510 MG/17 ML
510.0000 mg | Freq: Once | INTRAVENOUS | Status: AC
  Administered 2011-11-18: 510 mg via INTRAVENOUS
  Filled 2011-11-18: qty 17

## 2011-11-18 NOTE — Progress Notes (Addendum)
Nurse, upon doing final chart check noticed Heparin 14.5 mg running in patient. Heparin was suppose to have been DC'd 11/17/11 @ 10:55 am by the day shift nurse. Informed charge nurse of oversight.  Harmon Pier

## 2011-11-18 NOTE — Progress Notes (Signed)
Phase I Cardiac Rehab  Discharge education completed. Covered MI, CHF, information book, restrictions, diet, home exercise, and phase II cardiac rehab. Pt states she "did not have" an MI. Pt voiced understanding to education and expressed interest in phase II cardiac rehab at [facility]. All questions answered. Will send referral and follow up. Will ambulate pt later today, pt was eating breakfast during d/c education and did not want to ambulate at this time. Electronically signed by Harriett Sine MS on Saturday November 18 2011 at [phone]

## 2011-11-18 NOTE — Progress Notes (Signed)
Subjective: Interval History: has no complaint of she is making more urine.  Objective: Vital signs in last 24 hours: Temp:  [96.7 F (35.9 C)-97.6 F (36.4 C)] 97.6 F (36.4 C) (02/23 0437) Pulse Rate:  [62-63] 63  (02/23 0437) Resp:  [18-19] 19  (02/23 0437) BP: (120-127)/(70-73) 120/73 mmHg (02/23 0437) SpO2:  [92 %-97 %] 92 % (02/23 0437) Weight:  [82.146 kg (181 lb 1.6 oz)] 82.146 kg (181 lb 1.6 oz) (02/23 0437) Weight change: 0.59 kg (1 lb 4.8 oz)  Intake/Output from previous day: 02/22 0701 - 02/23 0700 In: 243 [P.O.:240; I.V.:3] Out: 950 [Urine:950] Intake/Output this shift:    General appearance: alert, cooperative and pale Resp: rales bibasilar Cardio: regular rate and rhythm and systolic murmur: holosystolic 2/6, blowing at apex GI: soft, pos bs, liver down 5 cm Extremities: edema 2+  Lab Results:  [facility] 11/18/11 0620 11/17/11 0525  WBC 10.0 10.2  HGB 8.8* 8.8*  HCT 26.7* 27.3*  PLT 246 210   BMET:  Basename 11/18/11 0620 11/17/11 0525  NA 134* 134*  K 4.0 4.1  CL 100 99  CO2 22 22  GLUCOSE 67* 208*  BUN 64* 63*  CREATININE 3.91* 4.14*  CALCIUM 10.0 9.6   No results found for this basename: PTH:2 in the last 72 hours Iron Studies:  Basename 11/17/11 1459  IRON 37*  TIBC 179*  TRANSFERRIN --  FERRITIN --    Studies/Results: No results found.  I have reviewed the patient's current medications.  Assessment/Plan: 1 Ckd 5 I feel this is the etiology of her issues at presentation. 2 Anemia epo and give  fe 3 HPTH P 4 CAD lessen strain with lower vol 5 PVD  Lasix, fe, PTH, , follow CR    LOS: 3 days   [name] L 11/18/2011,8:43 AM

## 2011-11-18 NOTE — Progress Notes (Signed)
CARDIAC REHAB PHASE I   PRE:  Rate/Rhythm:59 SB  BP:  Supine:   Sitting:   Standing: 128/78   SaO2:   MODE:  Ambulation: 300 ft   POST:  Rate/Rhythem: 70   BP:  Supine:   Sitting: 132/74  Standing:    SaO2: 97 RA  [name]  Ambulated 300 ft assist x 1 with rolling walker and IV pole. Took one short rest break, no symptoms reported. SA02 remained in mid-nineties during ambulation. Gait somewhat stable using rolling walker, pt steps awkwardly but did not appear from this ambulation to be an overt high fall risk. Returned to [employer] VSS. IV pole reconnected to wall outlet. No questions from this AM's d/c education. Will send phase II referral and follow up. Electronically signed by Harriett Sine MS on Saturday November 18 2011 at 1130 EST

## 2011-11-18 NOTE — Progress Notes (Signed)
SUBJECTIVE:  SOB much improved.  No chest pain  OBJECTIVE:   Vitals:   Filed Vitals:   11/17/11 0204 11/17/11 0427 11/17/11 1403 11/18/11 0437  BP:  110/65 127/70 120/73  Pulse:  59 62 63  Temp:  96.5 F (35.8 C) 96.7 F (35.9 C) 97.6 F (36.4 C)  TempSrc:  Oral Oral Oral  Resp:  18 18 19   Height:      Weight: 81.557 kg (179 lb 12.8 oz)   82.146 kg (181 lb 1.6 oz)  SpO2:  96% 97% 92%   I&O's:   Intake/Output Summary (Last 24 hours) at 11/18/11 1207 Last data filed at 11/18/11 1610  Gross per 24 hour  Intake      3 ml  Output   1200 ml  Net  -1197 ml   TELEMETRY: Reviewed telemetry pt in NSR     PHYSICAL EXAM General: Well developed, well nourished, in no acute distress Head: Eyes PERRLA, No xanthomas.   Normal cephalic and atramatic  Lungs:   Clear bilaterally to auscultation and percussion. Heart:   HRRR S1 S2 Pulses are 2+ & equal. Abdomen: Bowel sounds are positive, abdomen soft and non-tender without masses Extremities:   No clubbing, cyanosis or edema.  DP +1 Neuro: Alert and oriented X 3. Psych:  Good affect, responds appropriately   LABS: Basic Metabolic Panel:  Basename 11/18/11 0620 11/17/11 0525 11/15/11 2247  NA 134* 134* --  K 4.0 4.1 --  CL 100 99 --  CO2 22 22 --  GLUCOSE 67* 208* --  BUN 64* 63* --  CREATININE 3.91* 4.14* --  CALCIUM 10.0 9.6 --  MG -- -- 2.2  PHOS 3.0 -- --   Liver Function Tests:  [facility] 11/18/11 0620 11/15/11 1714  AST 12 18  ALT 11 9  ALKPHOS 69 59  BILITOT 0.3 0.9  PROT 7.0 6.6  ALBUMIN 3.3* 3.2*    CBC:  Basename 11/18/11 0620 11/17/11 0525 11/15/11 1714  WBC 10.0 10.2 --  NEUTROABS -- -- 9.5*  HGB 8.8* 8.8* --  HCT 26.7* 27.3* --  MCV 89.6 91.0 --  PLT 246 210 --   Cardiac Enzymes:  Basename 11/16/11 1045 11/16/11 0440 11/15/11 2247  CKTOTAL 177 198* 219*  CKMB 8.0* 9.7* 10.4*  CKMBINDEX -- -- --  TROPONINI 3.84* 5.81* 6.59*   Fasting Lipid Panel:  Basename 11/16/11 0440  CHOL 113  HDL  21*  LDLCALC 72  TRIG 98  CHOLHDL 5.4  LDLDIRECT --   Thyroid Function Tests:  Basename 11/15/11 2247  TSH 3.427  T4TOTAL --  T3FREE --  THYROIDAB --   Anemia Panel:  Basename 11/17/11 1459  VITAMINB12 --  FOLATE --  FERRITIN --  TIBC 179*  IRON 37*  RETICCTPCT --   Coag Panel:   Lab Results  Component Value Date   INR 1.30 11/15/2011   INR 1.1 07/11/2007    RADIOLOGY: Dg Chest 2 View  11/15/2011  *RADIOLOGY REPORT*  Clinical Data: Shortness of breath.  History of hypertension. Diabetes.  History of abnormal EKG.  CHEST - 2 VIEW  Comparison: 11/28/2007.  Findings: There is stable moderate cardiac silhouette enlargement. Mediastinal and hilar contours appear stable.  Chronic increase in basilar reticular pulmonary markings appears stable.  No consolidation or pleural effusion is evident.  There is osteopenic appearance of the bones.  Ectasia, tortuosity, and calcifications of the aorta are seen.  IMPRESSION: Moderate cardiac silhouette enlargement.  No pulmonary edema, consolidation, or pleural effusion.  Chronic nonspecific increase in basilar reticular markings are seen.  This may be associated with fibrosis.  No acute superimposed process evident.  Original Report Authenticated By: Crawford Givens, M.D.      ASSESSMENT:  1.  NSTEMI possibly type 2 secondary to CHF/renal failure in setting of severe diffuse CAD 2.  DM type II 3.  Dyslipidemia 4.  Chronic anemia 5.  Hypertension 6.  CAD with poor distal targets 7.  PVD 8.  Acute on chronic mixed systolic/diastolic heart failure - improved with no SOB today and lungs clear 9.  CKD stage 4 10.  Mild LV dysfunction EF 45-50% by echo   PLAN:   1.  Continue beta blocker/hydralazine/Plavix and nitrates 2.  No ACEI or ARB due to renal failure 3.  Continue Lasix per renal   Quintella Reichert, MD  11/18/2011  12:07 PM

## 2011-11-19 DIAGNOSIS — N186 End stage renal disease: Secondary | ICD-10-CM

## 2011-11-19 DIAGNOSIS — Z992 Dependence on renal dialysis: Secondary | ICD-10-CM

## 2011-11-19 LAB — CBC
HCT: 27.7 % — ABNORMAL LOW (ref 36.0–46.0)
Hemoglobin: 9 g/dL — ABNORMAL LOW (ref 12.0–15.0)
MCHC: 32.5 g/dL (ref 30.0–36.0)
RBC: 3.04 MIL/uL — ABNORMAL LOW (ref 3.87–5.11)

## 2011-11-19 LAB — GLUCOSE, CAPILLARY
Glucose-Capillary: 235 mg/dL — ABNORMAL HIGH (ref 70–99)
Glucose-Capillary: 279 mg/dL — ABNORMAL HIGH (ref 70–99)

## 2011-11-19 LAB — RENAL FUNCTION PANEL
BUN: 65 mg/dL — ABNORMAL HIGH (ref 6–23)
CO2: 24 mEq/L (ref 19–32)
Calcium: 10.2 mg/dL (ref 8.4–10.5)
Creatinine, Ser: 4.09 mg/dL — ABNORMAL HIGH (ref 0.50–1.10)
GFR calc non Af Amer: 11 mL/min — ABNORMAL LOW (ref >90)

## 2011-11-19 MED ORDER — METOPROLOL SUCCINATE ER 25 MG PO TB24
25.0000 mg | ORAL_TABLET | Freq: Every day | ORAL | Status: DC
  Administered 2011-11-19 – 2011-11-22 (×4): 25 mg via ORAL
  Filled 2011-11-19 (×4): qty 1

## 2011-11-19 MED ORDER — ENOXAPARIN SODIUM 30 MG/0.3ML ~~LOC~~ SOLN
30.0000 mg | SUBCUTANEOUS | Status: DC
  Administered 2011-11-19 – 2011-11-22 (×3): 30 mg via SUBCUTANEOUS
  Filled 2011-11-19 (×4): qty 0.3

## 2011-11-19 NOTE — Progress Notes (Signed)
Subjective: Interval History: none.  Objective: Vital signs in last 24 hours: Temp:  [97.8 F (36.6 C)-98 F (36.7 C)] 98 F (36.7 C) (02/24 0441) Pulse Rate:  [58-63] 58  (02/24 0441) Resp:  [18-19] 19  (02/24 0441) BP: (107-118)/(52-68) 107/52 mmHg (02/24 0441) SpO2:  [94 %-97 %] 94 % (02/24 0441) Weight:  [80.65 kg (177 lb 12.8 oz)] 80.65 kg (177 lb 12.8 oz) (02/24 0441) Weight change: -1.497 kg (-3 lb 4.8 oz)  Intake/Output from previous day: 02/23 0701 - 02/24 0700 In: 1340 [P.O.:1220; I.V.:120] Out: 2150 [Urine:2150] Intake/Output this shift:    General appearance: alert, cooperative and pale Resp: rales bibasilar Cardio: regular rate and rhythm, S1, S2 normal and systolic murmur: holosystolic 2/6, blowing at apex GI: pos bs, liver down 4 cm,soft Extremities: edema 2+  Lab Results:  Basename 11/19/11 0530 11/18/11 0620  WBC 7.8 10.0  HGB 9.0* 8.8*  HCT 27.7* 26.7*  PLT 235 246   BMET:  Basename 11/19/11 0530 11/18/11 0620  NA 138 134*  K 4.6 4.0  CL 103 100  CO2 24 22  GLUCOSE 77 67*  BUN 65* 64*  CREATININE 4.09* 3.91*  CALCIUM 10.2 10.0   No results found for this basename: PTH:2 in the last 72 hours Iron Studies:  Basename 11/17/11 1459  IRON 37*  TIBC 179*  TRANSFERRIN --  FERRITIN --    Studies/Results: No results found.  I have reviewed the patient's current medications.  Assessment/Plan: 1 CKD4 diuresing, CR stable and has a loss of function, will need dialysis soon.  Will get new access,can do as in or out patient 2 Anemia 3 DM fair control 4 HPTH P 5 PVD 6 CAD per cards  P Access, Lasix, d/C when ok with cards    LOS: 4 days   Worthington Cruzan L 11/19/2011,8:50 AM

## 2011-11-19 NOTE — Progress Notes (Signed)
@   Subjective:  Dyspnea improving but not at baseline; no chest pain   Objective:  Filed Vitals:   11/18/11 0437 11/18/11 1320 11/18/11 2025 11/19/11 0441  BP: 120/73 115/68 118/56 107/52  Pulse: 63 62 63 58  Temp: 97.6 F (36.4 C) 97.9 F (36.6 C) 97.8 F (36.6 C) 98 F (36.7 C)  TempSrc: Oral Oral Oral Oral  Resp: 19 18 18 19   Height:      Weight: 181 lb 1.6 oz (82.146 kg)   177 lb 12.8 oz (80.65 kg)  SpO2: 92% 95% 97% 94%    Intake/Output from previous day:  Intake/Output Summary (Last 24 hours) at 11/19/11 1025 Last data filed at 11/19/11 0800  Gross per 24 hour  Intake   1340 ml  Output   1550 ml  Net   -210 ml    Physical Exam: Physical exam: Well-developed well-nourished in no acute distress.  Skin is warm and dry.  HEENT is normal.  Neck is supple. No thyromegaly.  Chest with minimal basilar crackles Cardiovascular exam is regular rate and rhythm. 2/6 systolic murmur Abdominal exam nontender or distended. No masses palpated. Extremities show 1+ edema. neuro grossly intact    Lab Results: Basic Metabolic Panel:  Basename 11/19/11 0530 11/18/11 0620  NA 138 134*  K 4.6 4.0  CL 103 100  CO2 24 22  GLUCOSE 77 67*  BUN 65* 64*  CREATININE 4.09* 3.91*  CALCIUM 10.2 10.0  MG -- --  PHOS 4.0 3.0   CBC:  Basename 11/19/11 0530 11/18/11 0620  WBC 7.8 10.0  NEUTROABS -- --  HGB 9.0* 8.8*  HCT 27.7* 26.7*  MCV 91.1 89.6  PLT 235 246   Cardiac Enzymes:  Basename 11/16/11 1045  CKTOTAL 177  CKMB 8.0*  CKMBINDEX --  TROPONINI 3.84*     Assessment/Plan:  1) NSTEMI - elevated enzymes most likely with contribution from CHF and renal insufficiency; plan medical therapy; continue ASA, plavix, lopressor and statin. 2) Renal insufficiency - managed by nephrology. Needs vascular acess. She will be high risk for any procedure, but I do not think we need to delay as this is necessary and she has no revascularization options. 3) Acute on chronic  systolic and diastolic CHF - continue lasix and follow renal function. Symptoms almost back to baseline. Lindley Magnus Azucena Dart 11/19/2011, 10:25 AM

## 2011-11-19 NOTE — Consult Note (Signed)
[facility] [place]  Patient name: [name] MRN: [id number] DOB: 08-05-1946 Sex: female  REASON FOR CONSULT: evaluate for hemodialysis access  HPI: [name] is a 66 y.o. female with a long history of chronic renal insufficiency. She had a left upper arm AV graft placed approximately 2 years ago according to the patient. She also had a previous left subclavian Port-A-Cath. She is not yet on dialysis. Her renal tissues he has progressed and we are asked to evaluate her for long-term access.  The patient was admitted on 11/15/2011 with chest pain. A shunt was then ruled in for a non-ST MI secondary to congestive heart failure and renal insufficiency. She has diffuse coronary artery disease.   She was recently seen in our office by Dr. Darrick Penna on 11/02/2011  With an ulcer on her right first toe. This wound has now healed.  Past Medical History  Diagnosis Date  . IDDM (insulin dependent diabetes mellitus)   . CAD (coronary artery disease)     a. 08/2005 - NSTEMI - Cath: Severe diffuse 3vd w/ NL EF - poor revasc candidate.;  b. 07/2012 Myoview - large area of ant, dist inf, & apical infarct w/ mild to mod peri-inf ischemia.  transient ischemic dilatation of the LV cavity  . Neuropathy   . Ulcer of foot     a. R foot - followed by Dr. Arbie [facility]okey & [facility]  . Hypertension   . DJD (degenerative joint disease)   . Cataracts, bilateral 07/15/10  . Hyperlipidemia   . CKD (chronic kidney disease) stage 4, GFR 15-29 ml/min     a. s/p AVG  . GERD (gastroesophageal reflux disease)   . Hiatal hernia   . Heart murmur   . Joint pain   . Dizziness   . Carotid artery occlusion     a. s/p left CEA 06/2007;  b. 07/2011 u/s - RICA 1-39%, LICA patent  . Diastolic CHF, chronic   . History of stroke   . Iron deficiency anemia   . History of retinal detachment     a. right eye - repair 11/2007  . History of osteomyelitis     a. Right foot  . PVD (peripheral vascular  disease)     a. ABI's 06/2011: R-37, L - 84     Family History  Problem Relation Age of Onset  . Hypertension Other   . Stroke Other   . Heart attack Other   . Cancer Other   . Heart disease Other   . Diabetes Mother   . Cancer Mother     lung - died 64  . Cirrhosis Father     died 42  . Cancer Sister     liver  . Cancer Son     lymphatic    SOCIAL HISTORY: History  Substance Use Topics  . Smoking status: Former Smoker    Types: Cigarettes  . Smokeless tobacco: Not on file   Comment: smoked a pack/week for about 20 yrs.  Quit about 25 y ago  . Alcohol Use: Yes     rare beer    Allergies  Allergen Reactions  . Codeine Other (See Comments)    unknown    Current Facility-Administered Medications  Medication Dose Route Frequency Provider Last Rate Last Dose  . 0.9 %  sodium chloride infusion  250 mL Intravenous PRN Ok Anis, NP 10 mL/hr at 11/18/11 1702 250 mL at 11/18/11 1702  . acetaminophen (  TYLENOL) tablet 650 mg  650 mg Oral Q4H PRN Ok Anis, NP      . aspirin EC tablet 81 mg  81 mg Oral Daily Ok Anis, NP   81 mg at 11/18/11 1027  . calcitRIOL (ROCALTROL) capsule 0.25 mcg  0.25 mcg Oral Daily Ok Anis, NP   0.25 mcg at 11/18/11 1028  . clopidogrel (PLAVIX) tablet 75 mg  75 mg Oral Q breakfast Ok Anis, NP   75 mg at 11/19/11 0742  . famotidine (PEPCID) tablet 40 mg  40 mg Oral Daily Ok Anis, NP   40 mg at 11/18/11 1028  . ferumoxytol [facility]) injection 510 mg  510 mg Intravenous Once Trevor Iha, MD   510 mg at 11/18/11 1141  . furosemide (LASIX) tablet 160 mg  160 mg Oral TID Trevor Iha, MD   160 mg at 11/18/11 2141  . gabapentin (NEURONTIN) capsule 100 mg  100 mg Oral TID Ok Anis, NP   100 mg at 11/18/11 2142  . hydrALAZINE (APRESOLINE) tablet 10 mg  10 mg Oral TID Trevor Iha, MD   10 mg at 11/18/11 2141  . insulin aspart (novoLOG) injection 0-15 Units   0-15 Units Subcutaneous TID WC Ok Anis, NP   5 Units at 11/18/11 1641  . insulin glargine (LANTUS) injection 25 Units  25 Units Subcutaneous Daily Ok Anis, NP   25 Units at 11/18/11 1033  . insulin glargine (LANTUS) injection 30 Units  30 Units Subcutaneous QHS Ok Anis, NP   30 Units at 11/18/11 2141  . isosorbide mononitrate (IMDUR) 24 hr tablet 30 mg  30 mg Oral Daily Ok Anis, NP   30 mg at 11/18/11 1029  . metoprolol succinate (TOPROL-XL) 24 hr tablet 25 mg  25 mg Oral Daily Trevor Iha, MD      . nitroGLYCERIN (NITROSTAT) SL tablet 0.4 mg  0.4 mg Sublingual Q5 Min x 3 PRN Ok Anis, NP      . ondansetron [facility]) injection 4 mg  4 mg Intravenous Q6H PRN Ok Anis, NP      . pantoprazole (PROTONIX) EC tablet 40 mg  40 mg Oral Q1200 Ok Anis, NP   40 mg at 11/18/11 1140  . rosuvastatin (CRESTOR) tablet 20 mg  20 mg Oral q1800 Ok Anis, NP   20 mg at 11/18/11 1641  . sodium chloride 0.9 % injection 3 mL  3 mL Intravenous Q12H Ok Anis, NP   3 mL at 11/18/11 2142  . sodium chloride 0.9 % injection 3 mL  3 mL Intravenous PRN Ok Anis, NP   3 mL at 11/16/11 2122  . DISCONTD: metoprolol succinate (TOPROL-XL) 24 hr tablet 50 mg  50 mg Oral Daily Ok Anis, NP   50 mg at 11/18/11 1140    REVIEW OF SYSTEMS: Arly.Keller ] denotes positive finding; [  ] denotes negative finding CARDIOVASCULAR:  Arly.Keller ] chest pain- on admission, and our results.   [ ]  chest pressure   [ ]  palpitations   [ ]  orthopnea   Arly.Keller ] dyspnea on exertion   [ ]  claudication   [ ]  rest pain   [ ]  DVT   [ ]  phlebitis PULMONARY:   [ ]  productive cough   [ ]  asthma   [ ]  wheezing NEUROLOGIC:   [ ]  weakness  [ ]  paresthesias  [ ]  aphasia  [ ]   amaurosis  [ ]  dizziness HEMATOLOGIC:   [ ]  bleeding problems   [ ]  clotting disorders MUSCULOSKELETAL:  [ ]  joint pain   [ ]  joint swelling Arly.Keller ] leg  swelling GASTROINTESTINAL: [ ]   blood in stool  [ ]   hematemesis GENITOURINARY:  [ ]   dysuria  [ ]   hematuria PSYCHIATRIC:  [ ]  history of major depression INTEGUMENTARY:  [ ]  rashes  [ ]  ulcers CONSTITUTIONAL:  [ ]  fever   [ ]  chills  PHYSICAL EXAM: Filed Vitals:   11/18/11 0437 11/18/11 1320 11/18/11 2025 11/19/11 0441  BP: 120/73 115/68 118/56 107/52  Pulse: 63 62 63 58  Temp: 97.6 F (36.4 C) 97.9 F (36.6 C) 97.8 F (36.6 C) 98 F (36.7 C)  TempSrc: Oral Oral Oral Oral  Resp: 19 18 18 19   Height:      Weight: 181 lb 1.6 oz (82.146 kg)   177 lb 12.8 oz (80.65 kg)  SpO2: 92% 95% 97% 94%   Body mass index is 28.70 kg/(m^2). GENERAL: The patient is a well-nourished female, in no acute distress. The vital signs are documented above. CARDIOVASCULAR: There is a regular rate and rhythm without significant murmur appreciated. She has a palpable radial and brachial pulse bilaterally. PULMONARY: There is good air exchange bilaterally without wheezing or rales. ABDOMEN: Soft and non-tender with normal pitched bowel sounds.  MUSCULOSKELETAL: There are no major deformities or cyanosis. NEUROLOGIC: No focal weakness or paresthesias are detected. SKIN: There are no ulcers or rashes noted. PSYCHIATRIC: The patient has a normal affect.  DATA:  Lab Results  Component Value Date   WBC 7.8 11/19/2011   HGB 9.0* 11/19/2011   HCT 27.7* 11/19/2011   MCV 91.1 11/19/2011   PLT 235 11/19/2011   Lab Results  Component Value Date   NA 138 11/19/2011   K 4.6 11/19/2011   CL 103 11/19/2011   CO2 24 11/19/2011   Lab Results  Component Value Date   CREATININE 4.09* 11/19/2011   Lab Results  Component Value Date   INR 1.30 11/15/2011   INR 1.1 07/11/2007    CBG (last 3)   Basename 11/19/11 0553 11/18/11 2114 11/18/11 1630  GLUCAP 70 195* 233*   CXR: No CHF  MEDICAL ISSUES:  We have been asked to evaluate this patient for new hemodialysis access. She's previously had a left upper arm AV  graft which is chronically occluded. I will order a vein mapping of both upper extremities. Based on the results of her vein mapping we can determine what her best option is for hemodialysis access. If it is safe from a cardiac standpoint to proceed with surgery on Tuesday I could potentially do her surgery on Tuesday. If she is discharged before that then we can arrange to do this as an outpatient within the next 2 weeks. If she needs more time to recover from her recent MI then certainly we can wait as long as is necessary.  Siedah Sedor S [facility] [place] Beeper: [phone]

## 2011-11-20 LAB — CBC
HCT: 26.8 % — ABNORMAL LOW (ref 36.0–46.0)
HCT: 28 % — ABNORMAL LOW (ref 36.0–46.0)
Hemoglobin: 8.6 g/dL — ABNORMAL LOW (ref 12.0–15.0)
Hemoglobin: 9.1 g/dL — ABNORMAL LOW (ref 12.0–15.0)
MCH: 28.8 pg (ref 26.0–34.0)
MCH: 29.5 pg (ref 26.0–34.0)
MCHC: 32.5 g/dL (ref 30.0–36.0)
MCV: 89.6 fL (ref 78.0–100.0)
RBC: 2.99 MIL/uL — ABNORMAL LOW (ref 3.87–5.11)

## 2011-11-20 LAB — BASIC METABOLIC PANEL
BUN: 66 mg/dL — ABNORMAL HIGH (ref 6–23)
GFR calc non Af Amer: 11 mL/min — ABNORMAL LOW (ref >90)
Glucose, Bld: 310 mg/dL — ABNORMAL HIGH (ref 70–99)
Potassium: 4.5 mEq/L (ref 3.5–5.1)

## 2011-11-20 LAB — GLUCOSE, CAPILLARY: Glucose-Capillary: 252 mg/dL — ABNORMAL HIGH (ref 70–99)

## 2011-11-20 LAB — RENAL FUNCTION PANEL
Albumin: 3.3 g/dL — ABNORMAL LOW (ref 3.5–5.2)
BUN: 68 mg/dL — ABNORMAL HIGH (ref 6–23)
Calcium: 10.1 mg/dL (ref 8.4–10.5)
Creatinine, Ser: 4.13 mg/dL — ABNORMAL HIGH (ref 0.50–1.10)
Phosphorus: 4.1 mg/dL (ref 2.3–4.6)
Potassium: 4 mEq/L (ref 3.5–5.1)

## 2011-11-20 LAB — TYPE AND SCREEN: ABO/RH(D): AB NEG

## 2011-11-20 MED ORDER — FAMOTIDINE 20 MG PO TABS
20.0000 mg | ORAL_TABLET | Freq: Every day | ORAL | Status: DC
  Administered 2011-11-21: 20 mg via ORAL
  Filled 2011-11-20: qty 1

## 2011-11-20 MED ORDER — FERUMOXYTOL INJECTION 510 MG/17 ML
510.0000 mg | Freq: Once | INTRAVENOUS | Status: AC
  Administered 2011-11-20: 510 mg via INTRAVENOUS
  Filled 2011-11-20: qty 17

## 2011-11-20 MED ORDER — DARBEPOETIN ALFA-POLYSORBATE 60 MCG/0.3ML IJ SOLN
60.0000 ug | INTRAMUSCULAR | Status: DC
  Administered 2011-11-20: 60 ug via SUBCUTANEOUS
  Filled 2011-11-20 (×3): qty 0.3

## 2011-11-20 MED ORDER — CEFAZOLIN SODIUM 1-5 GM-% IV SOLN
1.0000 g | INTRAVENOUS | Status: AC
  Administered 2011-11-21: 1 g via INTRAVENOUS
  Filled 2011-11-20: qty 50

## 2011-11-20 NOTE — Progress Notes (Signed)
Inpatient Diabetes Program Recommendations  AACE/ADA: New Consensus Statement on Inpatient Glycemic Control (2009)  Target Ranges:  Prepandial:   less than 140 mg/dL      Peak postprandial:   less than 180 mg/dL (1-2 hours)      Critically ill patients:  140 - 180 mg/dL   Reason for Visit: Post-prandial hyperlgycemia  Inpatient Diabetes Program Recommendations Insulin - Meal Coverage: Pt needs: please add 4 units tidwc when eating. HgbA1C: Please check current A1C (Last documented was in 2010 at 9%  Note: Pt may benefit from Byetta at 5 mcg to start at home, an incretin mimetic. Thank you, Lenor Coffin, RN, CNS, Diabetes Coordinator [phone])

## 2011-11-20 NOTE — Progress Notes (Signed)
Patient ID: [name], female   DOB: [birth year], 66 y.o.   MRN: [id number] S:feels better today but still weak O:BP 116/69  Pulse 60  Temp(Src) 98.1 F (36.7 C) (Oral)  Resp 19  Ht 5\' 6"  (1.676 m)  Wt 80.151 kg (176 lb 11.2 oz)  BMI 28.52 kg/m2  SpO2 95%  Intake/Output Summary (Last 24 hours) at 11/20/11 1128 Last data filed at 11/20/11 1037  Gross per 24 hour  Intake    723 ml  Output   2201 ml  Net  -1478 ml   Weight change: -0.499 kg (-1 lb 1.6 oz) Gen:WD WN WF in NAD CVS:no rub, RRR Resp:bibasilar crackles WUJ:WJXBJY Ext:2-3+ pitting edema pretib   Lab 11/20/11 0600 11/19/11 0530 11/18/11 0620 11/17/11 0525 11/16/11 0440 11/15/11 1714  NA 134* 138 134* 134* 135 127*  K 4.0 4.6 4.0 4.1 3.9 4.2  CL 98 103 100 99 101 93*  CO2 25 24 22 22 22 20   GLUCOSE 146* 77 67* 208* 242* 477*  BUN 68* 65* 64* 63* 59* 55*  CREATININE 4.13* 4.09* 3.91* 4.14* 3.75* 3.57*  ALB -- -- -- -- -- --  CALCIUM 10.1 10.2 10.0 9.6 9.1 9.4  PHOS 4.1 4.0 3.0 -- -- --  AST -- -- 12 -- -- 18  ALT -- -- 11 -- -- 9   Liver Function Tests:  Lab 11/20/11 0600 11/19/11 0530 11/18/11 0620 11/15/11 1714  AST -- -- 12 18  ALT -- -- 11 9  ALKPHOS -- -- 69 59  BILITOT -- -- 0.3 0.9  PROT -- -- 7.0 6.6  ALBUMIN 3.3* 3.2* 3.3* --   No results found for this basename: LIPASE:3,AMYLASE:3 in the last 168 hours No results found for this basename: AMMONIA:3 in the last 168 hours CBC:  Lab 11/20/11 0600 11/19/11 0530 11/18/11 0620 11/17/11 0525 11/16/11 0440 11/15/11 1714  WBC 8.2 7.8 10.0 -- -- --  NEUTROABS -- -- -- -- -- 9.5*  HGB 8.6* 9.0* 8.8* -- -- --  HCT 26.8* 27.7* 26.7* -- -- --  MCV 89.6 91.1 89.6 91.0 90.4 --  PLT 238 235 246 -- -- --   Cardiac Enzymes:  Lab 11/16/11 1045 11/16/11 0440 11/15/11 2247 11/15/11 1730  CKTOTAL 177 198* 219* 238*  CKMB 8.0* 9.7* 10.4* 10.0*  CKMBINDEX -- -- -- --  TROPONINI 3.84* 5.81* 6.59* 5.19*   CBG:  Lab 11/20/11 0551 11/19/11 2051 11/19/11 1629  11/19/11 1108 11/19/11 0553  GLUCAP 138* 334* 279* 235* 70    Iron Studies:  Basename 11/17/11 1459  IRON 37*  TIBC 179*  TRANSFERRIN --  FERRITIN --   Studies/Results: No results found.    Marland Kitchen aspirin EC  81 mg Oral Daily  . calcitRIOL  0.25 mcg Oral Daily  . clopidogrel  75 mg Oral Q breakfast  . enoxaparin (LOVENOX) injection  30 mg Subcutaneous Q24H  . famotidine  20 mg Oral Daily  . furosemide  160 mg Oral TID  . gabapentin  100 mg Oral TID  . hydrALAZINE  10 mg Oral TID  . insulin aspart  0-15 Units Subcutaneous TID WC  . insulin glargine  25 Units Subcutaneous Daily  . insulin glargine  30 Units Subcutaneous QHS  . isosorbide mononitrate  30 mg Oral Daily  . metoprolol succinate  25 mg Oral Daily  . pantoprazole  40 mg Oral Q1200  . rosuvastatin  20 mg Oral q1800  . sodium chloride  3 mL Intravenous  Q12H  . DISCONTD: famotidine  40 mg Oral Daily    BMET    Component Value Date/Time   NA 134* 11/20/2011 0600   K 4.0 11/20/2011 0600   CL 98 11/20/2011 0600   CO2 25 11/20/2011 0600   GLUCOSE 146* 11/20/2011 0600   BUN 68* 11/20/2011 0600   CREATININE 4.13* 11/20/2011 0600   CALCIUM 10.1 11/20/2011 0600   CALCIUM 9.6 08/28/2011 0937   GFRNONAA 10* 11/20/2011 0600   GFRAA 12* 11/20/2011 0600   CBC    Component Value Date/Time   WBC 8.2 11/20/2011 0600   RBC 2.99* 11/20/2011 0600   HGB 8.6* 11/20/2011 0600   HCT 26.8* 11/20/2011 0600   PLT 238 11/20/2011 0600   MCV 89.6 11/20/2011 0600   MCH 28.8 11/20/2011 0600   MCHC 32.1 11/20/2011 0600   RDW 15.2 11/20/2011 0600   LYMPHSABS 0.9 11/15/2011 1714   MONOABS 0.8 11/15/2011 1714   EOSABS 0.1 11/15/2011 1714   BASOSABS 0.0 11/15/2011 1714     Assessment/Plan:  1. AKI/CKD- creat up somewhat with increased diuresis.  Agree with placement of permanent access and hopefully can hold off HD until access has matured 2. Acute on chronic CHF- diuresing well, cont with diuretics 3. Vascular access- per Dr. Edilia Bo  tomorrow 4. Anemia- chronic disease/acute illness.  Was on epo as outpt, not on current med list, will dose with aranesp and follow iron stores 5. HTN- stable 6. dispo- per cards  [place] Sarinana A

## 2011-11-20 NOTE — Progress Notes (Signed)
CARDIAC REHAB PHASE I   PRE:  Rate/Rhythm: 58 SB  BP:  Supine:   Sitting: 110/60  Standing:    SaO2: 97 RA  MODE:  Ambulation: 350 ft   POST:  Rate/Rhythem: 64  BP:  Supine:   Sitting: 116/60  Standing:    SaO2: 96 RA 1358-1420 Assisted X 1 and used walker to ambulate. Gait steady with walker. Able to walk 350 ft.with two standing rest stops. VS stable Back to recliner after walk with call light in reach.  [name]

## 2011-11-20 NOTE — Progress Notes (Signed)
VASCULAR PROGRESS NOTE  SUBJECTIVE: No complaints this morning. She would like to proceed with placement of access this admission if possible.  PHYSICAL EXAM: Filed Vitals:   11/19/11 0441 11/19/11 1414 11/19/11 2018 11/20/11 0447  BP: 107/52 112/61 121/65 126/72  Pulse: 58 57 61 55  Temp: 98 F (36.7 C) 97.9 F (36.6 C) 98.2 F (36.8 C) 98.1 F (36.7 C)  TempSrc: Oral Oral Oral Oral  Resp: 19 18 20 19   Height:      Weight: 177 lb 12.8 oz (80.65 kg)   176 lb 11.2 oz (80.151 kg)  SpO2: 94% 93% 93% 95%   Lungs: clear to auscultation Palpable brachial and radial pulses bilaterally.  LABS: Lab Results  Component Value Date   WBC 8.2 11/20/2011   HGB 8.6* 11/20/2011   HCT 26.8* 11/20/2011   MCV 89.6 11/20/2011   PLT 238 11/20/2011   Lab Results  Component Value Date   CREATININE 4.13* 11/20/2011   Lab Results  Component Value Date   INR 1.30 11/15/2011   CBG (last 3)   Basename 11/20/11 0551 11/19/11 2051 11/19/11 1629  GLUCAP 138* 334* 279*   ASSESSMENT/PLAN: 1.the patient would like to proceed with placement of new access while she is here. We'll try to schedule for tomorrow. Vein mapping on the right arm showed that the upper arm cephalic vein in upper arm basilic vein were patent although thickened. Vein map and the left arm is pending. Pending the results of the vein mapping in the left arm side which are to attempt access on. Cardiology has stated that the patient is at increased risk for surgery but heals that it is reasonable to proceed during this admission.  Waverly Ferrari, MD, FACS Beeper: [phone] 11/20/2011

## 2011-11-20 NOTE — Progress Notes (Signed)
Right  Upper Extremity Vein Map  Left Upper Extremity Vein Map    Cephalic RIGHT  Segment Diameter Depth Comment  1. Axilla 2.35mm mm sclerotic  2. Mid upper arm 2.35mm mm sclerotic  3. Above AC 1.47mm mm sclerotic  4. In [facility] 1.67mm mm sclerotic  5. Below AC mm mm Not visulaized  6. Mid forearm mm mm   7. Wrist mm mm    mm mm    mm mm    mm mm    Basilic RIGHT  Segment Diameter Depth Comment  1. Axilla 2.14mm 15mm sclerotic  2. Mid upper arm 2.3mm 6.40mm sclerotic  3. Above [facility] 1.21mm 6.15mm sclerotic  4. In [facility] 3.27mm 1.38mm sclerotic  5. Below AC 3.35mm 4mm branching  6. Mid forearm 4.49mm 4.33mm IV. Partial thrombosis  7. Wrist mm mm    mm mm    mm mm    mm mm     Cephalic LEFT Too small and sclerotic to image  Segment Diameter Depth Comment  1. Axilla mm mm   2. Mid upper arm mm mm   3. Above AC mm mm   4. In AC mm mm   5. Below AC mm mm   6. Mid forearm mm mm   7. Wrist mm mm    mm mm    mm mm    mm mm    Basilic LEFT  Segment Diameter Depth Comment  1. Axilla mm mm GRAFT  2. Mid upper arm mm mm GRAFT  3. Above AC mm mm GRAFT  4. In AC 2.4mm 6.57mm   5. Below AC 2.27mm 6.17mm   6. Mid forearm 2.19mm 6.58mm   7. Wrist 2.33mm 6.41mm    mm mm    mm mm    mm mm

## 2011-11-20 NOTE — Progress Notes (Signed)
    Subjective:  Pt feels better. Notes breathing and orthopnea are improved. Sleeping better. No chest pain.  Objective:  Vital Signs in the last 24 hours: Temp:  [97.9 F (36.6 C)-98.2 F (36.8 C)] 98.1 F (36.7 C) (02/25 0447) Pulse Rate:  [55-61] 55  (02/25 0447) Resp:  [18-20] 19  (02/25 0447) BP: (112-126)/(61-72) 126/72 mmHg (02/25 0447) SpO2:  [93 %-95 %] 95 % (02/25 0447) Weight:  [80.151 kg (176 lb 11.2 oz)] 80.151 kg (176 lb 11.2 oz) (02/25 0447)  Intake/Output from previous day: 02/24 0701 - 02/25 0700 In: 720 [P.O.:720] Out: 2201 [Urine:2200; Stool:1]  Physical Exam: Pt is alert and oriented, NAD HEENT: normal Neck: JVP - mildly increased Lungs: CTA bilaterally CV: RRR without murmur or gallop Abd: soft, NT, Positive BS, no hepatomegaly Ext: 1+ pretibial edema right greater than left Skin: warm/dry no rash   Lab Results:  Basename 11/20/11 0600 11/19/11 0530  WBC 8.2 7.8  HGB 8.6* 9.0*  PLT 238 235    Basename 11/20/11 0600 11/19/11 0530  NA 134* 138  K 4.0 4.6  CL 98 103  CO2 25 24  GLUCOSE 146* 77  BUN 68* 65*  CREATININE 4.13* 4.09*   No results found for this basename: TROPONINI:2,CK,MB:2 in the last 72 hours  2D Echo: - Left ventricle: Hypokinesis mid/apical inferior and mid/apical inferoseptal. Inferolateral wall not seen well. The cavity size was normal. Wall thickness was increased in a pattern of mild LVH. Systolic function was mildly reduced. The estimated ejection fraction was in the range of 45% to 50%. Doppler parameters are consistent with high ventricular filling pressure. - Right atrium: The atrium was mildly dilated.  Assessment/Plan:  1. Acute on chronic mixed CHF. LVEF mildly decreased. Pt diuresing on high-dose oral furosemide (negative 1.4 liters last 24 hours). Symptomatically better. Meds reviewed and will continue current Rx. 2. Acute on chronic renal failure. Pt for AV fistula placement tomorrow per Dr Edilia Bo.  Appreciate his care. Risk of surgery increased but she is on good med Rx and clinically improved - reasonable to proceed at this time without further risk-stratification. Appreciate nephrology management. 3. NSTEMI - suspect Type 2 NSTEMI in patient with known multivessel CAD. Stable without chest pain. 4. Disposition - likely home after dialysis access, anticipate discharge Wednesday.  Tonny Bollman, M.D. 11/20/2011, 9:32 AM

## 2011-11-21 ENCOUNTER — Encounter (HOSPITAL_COMMUNITY)
Admission: EM | Disposition: A | Discharge status: Home / Self Care | Payer: Self-pay | Source: Home / Self Care | Attending: Internal Medicine

## 2011-11-21 ENCOUNTER — Encounter (HOSPITAL_COMMUNITY): Payer: Self-pay | Admitting: Anesthesiology

## 2011-11-21 ENCOUNTER — Inpatient Hospital Stay (HOSPITAL_COMMUNITY): Payer: Medicare Other | Admitting: Anesthesiology

## 2011-11-21 HISTORY — PX: AV FISTULA PLACEMENT: SHX1204

## 2011-11-21 LAB — GLUCOSE, RANDOM: Glucose, Bld: 426 mg/dL — ABNORMAL HIGH (ref 70–99)

## 2011-11-21 LAB — BASIC METABOLIC PANEL
BUN: 73 mg/dL — ABNORMAL HIGH (ref 6–23)
Creatinine, Ser: 3.98 mg/dL — ABNORMAL HIGH (ref 0.50–1.10)
GFR calc Af Amer: 13 mL/min — ABNORMAL LOW (ref >90)
GFR calc non Af Amer: 11 mL/min — ABNORMAL LOW (ref >90)

## 2011-11-21 LAB — SURGICAL PCR SCREEN
MRSA, PCR: POSITIVE — AB
Staphylococcus aureus: POSITIVE — AB

## 2011-11-21 LAB — GLUCOSE, CAPILLARY
Glucose-Capillary: 204 mg/dL — ABNORMAL HIGH (ref 70–99)
Glucose-Capillary: 133 mg/dL — ABNORMAL HIGH (ref 70–99)
Glucose-Capillary: 152 mg/dL — ABNORMAL HIGH (ref 70–99)
Glucose-Capillary: 479 mg/dL — ABNORMAL HIGH (ref 70–99)

## 2011-11-21 SURGERY — ARTERIOVENOUS (AV) FISTULA CREATION
Anesthesia: Monitor Anesthesia Care | Site: Arm Upper | Wound class: Clean

## 2011-11-21 MED ORDER — FENTANYL CITRATE 0.05 MG/ML IJ SOLN
INTRAMUSCULAR | Status: DC | PRN
  Administered 2011-11-21: 50 ug via INTRAVENOUS

## 2011-11-21 MED ORDER — HEPARIN SODIUM (PORCINE) 1000 UNIT/ML IJ SOLN
INTRAMUSCULAR | Status: DC | PRN
  Administered 2011-11-21: 5000 [IU] via INTRAVENOUS

## 2011-11-21 MED ORDER — SODIUM CHLORIDE 0.9 % IV SOLN
INTRAVENOUS | Status: DC | PRN
  Administered 2011-11-21: 11:00:00 via INTRAVENOUS

## 2011-11-21 MED ORDER — SODIUM CHLORIDE 0.9 % IR SOLN
Status: DC | PRN
  Administered 2011-11-21: 12:00:00

## 2011-11-21 MED ORDER — OXYCODONE HCL 5 MG PO TABS
5.0000 mg | ORAL_TABLET | Freq: Four times a day (QID) | ORAL | Status: DC | PRN
  Administered 2011-11-22: 5 mg via ORAL
  Filled 2011-11-21: qty 1

## 2011-11-21 MED ORDER — LIDOCAINE HCL (PF) 1 % IJ SOLN
INTRAMUSCULAR | Status: DC | PRN
  Administered 2011-11-21: 20 mL
  Administered 2011-11-21: 30 mL

## 2011-11-21 MED ORDER — MUPIROCIN 2 % EX OINT
1.0000 "application " | TOPICAL_OINTMENT | Freq: Two times a day (BID) | CUTANEOUS | Status: DC
  Administered 2011-11-21 – 2011-11-22 (×3): 1 via NASAL
  Filled 2011-11-21: qty 22

## 2011-11-21 MED ORDER — 0.9 % SODIUM CHLORIDE (POUR BTL) OPTIME
TOPICAL | Status: DC | PRN
  Administered 2011-11-21: 1000 mL

## 2011-11-21 MED ORDER — ONDANSETRON HCL 4 MG/2ML IJ SOLN
INTRAMUSCULAR | Status: DC | PRN
  Administered 2011-11-21: 4 mg via INTRAVENOUS

## 2011-11-21 MED ORDER — MIDAZOLAM HCL 5 MG/5ML IJ SOLN
INTRAMUSCULAR | Status: DC | PRN
  Administered 2011-11-21 (×3): 1 mg via INTRAVENOUS

## 2011-11-21 MED ORDER — OXYCODONE-ACETAMINOPHEN 5-325 MG PO TABS: 1.0000 | ORAL_TABLET | ORAL | Status: DC | PRN

## 2011-11-21 MED ORDER — PROTAMINE SULFATE 10 MG/ML IV SOLN
INTRAVENOUS | Status: DC | PRN
  Administered 2011-11-21: 20 mg via INTRAVENOUS
  Administered 2011-11-21: 10 mg via INTRAVENOUS

## 2011-11-21 MED ORDER — CHLORHEXIDINE GLUCONATE CLOTH 2 % EX PADS
6.0000 | MEDICATED_PAD | Freq: Every day | CUTANEOUS | Status: DC
  Administered 2011-11-21 – 2011-11-22 (×2): 6 via TOPICAL

## 2011-11-21 MED ORDER — FAMOTIDINE 20 MG PO TABS
20.0000 mg | ORAL_TABLET | Freq: Every day | ORAL | Status: DC
  Administered 2011-11-22: 20 mg via ORAL
  Filled 2011-11-21: qty 1

## 2011-11-21 MED ORDER — CEFAZOLIN SODIUM 1-5 GM-% IV SOLN
INTRAVENOUS | Status: DC | PRN
  Administered 2011-11-21: 1 g via INTRAVENOUS

## 2011-11-21 SURGICAL SUPPLY — 42 items
CANISTER SUCTION 2500CC (MISCELLANEOUS) ×2 IMPLANT
CLIP TI MEDIUM 6 (CLIP) ×2 IMPLANT
CLIP TI WIDE RED SMALL 24 (CLIP) ×2 IMPLANT
CLIP TI WIDE RED SMALL 6 (CLIP) ×2 IMPLANT
CLOTH BEACON ORANGE TIMEOUT ST (SAFETY) ×2 IMPLANT
COVER PROBE W GEL 5X96 (DRAPES) IMPLANT
COVER SURGICAL LIGHT HANDLE (MISCELLANEOUS) ×4 IMPLANT
DECANTER SPIKE VIAL GLASS SM (MISCELLANEOUS) IMPLANT
DERMABOND ADVANCED (GAUZE/BANDAGES/DRESSINGS) ×2
DERMABOND ADVANCED .7 DNX12 (GAUZE/BANDAGES/DRESSINGS) ×2 IMPLANT
DRAIN PENROSE 1/2X12 LTX STRL (WOUND CARE) IMPLANT
ELECT REM PT RETURN 9FT ADLT (ELECTROSURGICAL) ×2
ELECTRODE REM PT RTRN 9FT ADLT (ELECTROSURGICAL) ×1 IMPLANT
GAUZE SPONGE 4X4 16PLY XRAY LF (GAUZE/BANDAGES/DRESSINGS) ×2 IMPLANT
GEL ULTRASOUND 20GR AQUASONIC (MISCELLANEOUS) ×2 IMPLANT
GLOVE BIO SURGEON STRL SZ7.5 (GLOVE) ×2 IMPLANT
GLOVE BIOGEL PI IND STRL 6.5 (GLOVE) ×5 IMPLANT
GLOVE BIOGEL PI IND STRL 7.5 (GLOVE) ×2 IMPLANT
GLOVE BIOGEL PI INDICATOR 6.5 (GLOVE) ×5
GLOVE BIOGEL PI INDICATOR 7.5 (GLOVE) ×2
GLOVE ECLIPSE 6.5 STRL STRAW (GLOVE) ×6 IMPLANT
GLOVE SURG SS PI 7.0 STRL IVOR (GLOVE) ×2 IMPLANT
GOWN BRE IMP PREV XXLGXLNG (GOWN DISPOSABLE) ×2 IMPLANT
GOWN STRL NON-REIN LRG LVL3 (GOWN DISPOSABLE) ×8 IMPLANT
KIT BASIN OR (CUSTOM PROCEDURE TRAY) ×2 IMPLANT
KIT ROOM TURNOVER OR (KITS) ×2 IMPLANT
NS IRRIG 1000ML POUR BTL (IV SOLUTION) ×2 IMPLANT
PACK CV ACCESS (CUSTOM PROCEDURE TRAY) ×2 IMPLANT
PAD ARMBOARD 7.5X6 YLW CONV (MISCELLANEOUS) ×4 IMPLANT
SPONGE GAUZE 4X4 12PLY (GAUZE/BANDAGES/DRESSINGS) ×2 IMPLANT
SPONGE SURGIFOAM ABS GEL 100 (HEMOSTASIS) IMPLANT
SUT PROLENE 6 0 BV (SUTURE) ×6 IMPLANT
SUT SILK 2 0 SH (SUTURE) ×2 IMPLANT
SUT SILK 3 0 (SUTURE) ×1
SUT SILK 3-0 18XBRD TIE 12 (SUTURE) ×1 IMPLANT
SUT VIC AB 3-0 SH 27 (SUTURE) ×3
SUT VIC AB 3-0 SH 27X BRD (SUTURE) ×3 IMPLANT
SUT VICRYL 4-0 PS2 18IN ABS (SUTURE) ×8 IMPLANT
TOWEL OR 17X24 6PK STRL BLUE (TOWEL DISPOSABLE) ×2 IMPLANT
TOWEL OR 17X26 10 PK STRL BLUE (TOWEL DISPOSABLE) ×2 IMPLANT
UNDERPAD 30X30 INCONTINENT (UNDERPADS AND DIAPERS) ×2 IMPLANT
WATER STERILE IRR 1000ML POUR (IV SOLUTION) ×2 IMPLANT

## 2011-11-21 NOTE — Progress Notes (Signed)
    Subjective:  No chest pain or dyspnea. Feels 'tired.'  Objective:  Vital Signs in the last 24 hours: Temp:  [98.2 F (36.8 C)-98.5 F (36.9 C)] 98.3 F (36.8 C) (02/26 0440) Pulse Rate:  [60-66] 66  (02/26 0440) Resp:  [18] 18  (02/26 0440) BP: (116-147)/(66-69) 147/69 mmHg (02/26 0440) SpO2:  [94 %-95 %] 95 % (02/26 0440) Weight:  [80.695 kg (177 lb 14.4 oz)] 80.695 kg (177 lb 14.4 oz) (02/26 0440)  Intake/Output from previous day: 02/25 0701 - 02/26 0700 In: 723 [P.O.:720; I.V.:3] Out: 850 [Urine:850]  Physical Exam: Pt is alert and oriented, NAD HEENT: normal Neck: JVP - mildly increased, carotids 2+= without bruits Lungs: CTA bilaterally CV: RRR without murmur or gallop Abd: soft, NT, Positive BS, no hepatomegaly Ext: 1+ edema right leg, trace edema on left, distal pulses intact and equal Skin: warm/dry no rash  Lab Results:  Basename 11/20/11 1835 11/20/11 0600  WBC 9.7 8.2  HGB 9.1* 8.6*  PLT 250 238    Basename 11/20/11 1835 11/20/11 0600  NA 133* 134*  K 4.5 4.0  CL 97 98  CO2 21 25  GLUCOSE 310* 146*  BUN 66* 68*  CREATININE 3.91* 4.13*   No results found for this basename: TROPONINI:2,CK,MB:2 in the last 72 hours  Tele: sinus rhythm, sinus brady. No tachy or major brady events.  Assessment/Plan:  1. Acute on chronic mixed CHF. LVEF mildly decreased. Pt diuresing on high-dose oral furosemide. I/O's essentially even yesterday. Will change furosemide to 160 mg BID. Continue Imdur, Hydralazine, Toprol XL. 2. Acute on chronic renal failure. Appreciate nephrology care. Pt for AV fistula placement today per Dr Edilia Bo.  3. NSTEMI - suspect Type 2 NSTEMI in patient with known multivessel CAD. Stable without chest pain. On ASA/Plavix/Crestor/Toprol. 4. Disposition - likely home after dialysis access as she is otherwise stable.  Tonny Bollman, M.D. 11/21/2011, 6:58 AM

## 2011-11-21 NOTE — Preoperative (Signed)
Beta Blockers   Reason not to administer Beta Blockers:Not Applicable 

## 2011-11-21 NOTE — Progress Notes (Signed)
S:feels good, [name] little sore but ok O:BP 147/69  Pulse 66  Temp(Src) 98.3 F (36.8 C) (Oral)  Resp 18  Ht 5\' 6"  (1.676 m)  Wt 80.695 kg (177 lb 14.4 oz)  BMI 28.71 kg/m2  SpO2 95%  Intake/Output Summary (Last 24 hours) at 11/21/11 1406 Last data filed at 11/21/11 1400  Gross per 24 hour  Intake    540 ml  Output   1000 ml  Net   -460 ml   Weight change: 0.544 kg (1 lb 3.2 oz) Gen:WD WN WF in NAD CVS:RRR Resp:bibasilar crackles ZOX:WRUEAV WUJ:WJXBJ edema, RUE AVF +T/B   Lab 11/21/11 0515 11/20/11 1835 11/20/11 0600 11/19/11 0530 11/18/11 0620 11/17/11 0525 11/16/11 0440 11/15/11 1714  NA 136 133* 134* 138 134* 134* 135 --  K 4.1 4.5 4.0 4.6 4.0 4.1 3.9 --  CL 96 97 98 103 100 99 101 --  CO2 26 21 25 24 22 22 22  --  GLUCOSE 241* 310* 146* 77 67* 208* 242* --  BUN 73* 66* 68* 65* 64* 63* 59* --  CREATININE 3.98* 3.91* 4.13* 4.09* 3.91* 4.14* 3.75* --  ALB -- -- -- -- -- -- -- --  CALCIUM 10.5 10.0 10.1 10.2 10.0 9.6 9.1 --  PHOS -- -- 4.1 4.0 3.0 -- -- --  AST -- -- -- -- 12 -- -- 18  ALT -- -- -- -- 11 -- -- 9   Liver Function Tests:  Lab 11/20/11 0600 11/19/11 0530 11/18/11 0620 11/15/11 1714  AST -- -- 12 18  ALT -- -- 11 9  ALKPHOS -- -- 69 59  BILITOT -- -- 0.3 0.9  PROT -- -- 7.0 6.6  ALBUMIN 3.3* 3.2* 3.3* --   No results found for this basename: LIPASE:3,AMYLASE:3 in the last 168 hours No results found for this basename: AMMONIA:3 in the last 168 hours CBC:  Lab 11/20/11 1835 11/20/11 0600 11/19/11 0530 11/18/11 0620 11/17/11 0525 11/15/11 1714  WBC 9.7 8.2 7.8 -- -- --  NEUTROABS -- -- -- -- -- 9.5*  HGB 9.1* 8.6* 9.0* -- -- --  HCT 28.0* 26.8* 27.7* -- -- --  MCV 90.9 89.6 91.1 89.6 91.0 --  PLT 250 238 235 -- -- --   Cardiac Enzymes:  Lab 11/16/11 1045 11/16/11 0440 11/15/11 2247 11/15/11 1730  CKTOTAL 177 198* 219* 238*  CKMB 8.0* 9.7* 10.4* 10.0*  CKMBINDEX -- -- -- --  TROPONINI 3.84* 5.81* 6.59* 5.19*   CBG:  Lab 11/21/11 0625  11/20/11 2102 11/20/11 1656 11/20/11 1549 11/20/11 1125  GLUCAP 204* 225* 346* 330* 252*    Iron Studies: No results found for this basename: IRON,TIBC,TRANSFERRIN,FERRITIN in the last 72 hours Studies/Results: No results found.    Marland Kitchen aspirin EC  81 mg Oral Daily  . calcitRIOL  0.25 mcg Oral Daily  .  ceFAZolin (ANCEF) IV  1 g Intravenous On Call  . Chlorhexidine Gluconate Cloth  6 each Topical Q0600  . clopidogrel  75 mg Oral Q breakfast  . darbepoetin (ARANESP) injection - DIALYSIS  60 mcg Subcutaneous Weekly  . enoxaparin (LOVENOX) injection  30 mg Subcutaneous Q24H  . famotidine  20 mg Oral Daily  . ferumoxytol  510 mg Intravenous Once  . furosemide  160 mg Oral TID  . gabapentin  100 mg Oral TID  . hydrALAZINE  10 mg Oral TID  . insulin aspart  0-15 Units Subcutaneous TID WC  . insulin glargine  25 Units Subcutaneous Daily  .  insulin glargine  30 Units Subcutaneous QHS  . isosorbide mononitrate  30 mg Oral Daily  . metoprolol succinate  25 mg Oral Daily  . mupirocin ointment  1 application Nasal BID  . pantoprazole  40 mg Oral Q1200  . rosuvastatin  20 mg Oral q1800  . sodium chloride  3 mL Intravenous Q12H    BMET    Component Value Date/Time   NA 136 11/21/2011 0515   K 4.1 11/21/2011 0515   CL 96 11/21/2011 0515   CO2 26 11/21/2011 0515   GLUCOSE 241* 11/21/2011 0515   BUN 73* 11/21/2011 0515   CREATININE 3.98* 11/21/2011 0515   CALCIUM 10.5 11/21/2011 0515   CALCIUM 9.6 08/28/2011 0937   GFRNONAA 11* 11/21/2011 0515   GFRAA 13* 11/21/2011 0515   CBC    Component Value Date/Time   WBC 9.7 11/20/2011 1835   RBC 3.08* 11/20/2011 1835   HGB 9.1* 11/20/2011 1835   HCT 28.0* 11/20/2011 1835   PLT 250 11/20/2011 1835   MCV 90.9 11/20/2011 1835   MCH 29.5 11/20/2011 1835   MCHC 32.5 11/20/2011 1835   RDW 15.5 11/20/2011 1835   LYMPHSABS 0.9 11/15/2011 1714   MONOABS 0.8 11/15/2011 1714   EOSABS 0.1 11/15/2011 1714   BASOSABS 0.0 11/15/2011 1714      Assessment/Plan: 1. AKI/CKD- creat up somewhat with increased diuresis. S/p placement of permanent access and hopefully can hold off HD until access has matured 2. Acute on chronic CHF- diuresing well, cont with diuretics 3. Vascular access- RUE AVF per Dr. Edilia Bo  4. Anemia- chronic disease/acute illness, now ABLA. Was on epo as outpt, not on current med list, will dose with aranesp and follow iron stores 5. HTN- stable 6. dispo- per cards   [name]

## 2011-11-21 NOTE — Anesthesia Preprocedure Evaluation (Addendum)
Anesthesia Evaluation  Patient identified by MRN, date of birth, ID band Patient awake    Reviewed: Allergy & Precautions, H&P , NPO status , Patient's Chart, lab work & pertinent test results, reviewed documented beta blocker date and time   History of Anesthesia Complications Negative for: history of anesthetic complications  Airway Mallampati: II  Neck ROM: Full  Mouth opening: Limited Mouth Opening  Dental  (+) Edentulous Upper and Edentulous Lower   Pulmonary shortness of breath and with exertion, pneumonia ,    + decreased breath sounds      Cardiovascular hypertension, Pt. on medications and Pt. on home beta blockers + CAD and + Past MI + Valvular Problems/Murmurs Regular Normal+ Systolic murmurs    Neuro/Psych  Neuromuscular disease    GI/Hepatic hiatal hernia, GERD-  Medicated and Controlled,  Endo/Other  Diabetes mellitus-, Poorly Controlled, Type 2  Renal/GU Dialysis and ESRFRenal disease     Musculoskeletal   Abdominal (+) obese,   Peds  Hematology   Anesthesia Other Findings   Reproductive/Obstetrics                          Anesthesia Physical Anesthesia Plan  ASA: III  Anesthesia Plan: MAC   Post-op Pain Management:    Induction: Intravenous  Airway Management Planned: Simple Face Mask and Natural Airway  Additional Equipment:   Intra-op Plan:   Post-operative Plan:   Informed Consent: I have reviewed the patients History and Physical, chart, labs and discussed the procedure including the risks, benefits and alternatives for the proposed anesthesia with the patient or authorized representative who has indicated his/her understanding and acceptance.     Plan Discussed with: CRNA and Surgeon  Anesthesia Plan Comments:         Anesthesia Quick Evaluation

## 2011-11-21 NOTE — Discharge Instructions (Signed)
    11/21/2011 [name] [id number] Feb 12, 1946  Surgeon(s): Chuck Hint, MD  Procedure(s): Right brachiocephalic AVF        x Do not stick graft for 12 weeks

## 2011-11-21 NOTE — Progress Notes (Signed)
Vascular surgery progress note:  Patient for right arm AV fistula or AV graft today. I have reviewed the vein mapping appears that the right arm is the best chance for an AV fistula. I have reviewed the procedure potential complications with the patient and she is agreeable to proceed.  [name]. Edilia Bo, MD, FACS Beeper [phone] 11/21/2011

## 2011-11-21 NOTE — Transfer of Care (Signed)
Immediate Anesthesia Transfer of Care Note  Patient: [name]  Procedure(s) Performed: Procedure(s) (LRB): ARTERIOVENOUS (AV) FISTULA CREATION (N/A)  Patient Location: PACU  Anesthesia Type: MAC  Level of Consciousness: awake  Airway & Oxygen Therapy: Patient Spontanous Breathing and Patient connected to nasal cannula oxygen  Post-op Assessment: Report given to PACU RN and Post -op Vital signs reviewed and stable  Post vital signs: stable  Complications: No apparent anesthesia complications

## 2011-11-21 NOTE — Op Note (Signed)
NAME: AKYAH LAGRANGE    MRN: [id number] DOB: Jun 26, 1946    DATE OF OPERATION: 11/21/2011  PREOP DIAGNOSIS: Chronic kidney disease  POSTOP DIAGNOSIS: Same  PROCEDURE: Exploration of right upper arm cephalic vein. Right  Basilic vein transposition  SURGEON: [name]. Edilia Bo, MD, FACS  ASSIST: Newton Pigg, PA  ANESTHESIA: local with sedation   EBL: minimal  INDICATIONS: MARGURETE GUAMAN is a 66 y.o. female had a recent myocardial infarction. She's had progressive renal insufficiency and we were asked to place long-term access.  FINDINGS: the upper arm cephalic vein had several areas of narrowing noted with a 3 mm dilator. The vein was sclerotic and small. The only option for a fistula on the right was a basilic vein transposition.  TECHNIQUE: the patient was taken to the operating room and sedated by anesthesia. The right upper extremity was prepped and draped in the usual sterile fashion. The skin was anesthetized with 1% lidocaine and a transverse incision was made just above the antecubital level. The cephalic vein was dissected free. The vein was ligated distally. It had some thickening and appeared to be sclerotic. I passed a 3 mm dilator up the vein and there was several areas that were narrowed dilator would not pass distally. Therefore, I did not think the cephalic vein was usable for a brachiocephalic fistula.  Attention was then turned to placing a basilic vein transposition. Through 3 incisions along the medial aspect of the right upper arm the basilic vein was harvested from the antecubital level to the axilla. Branches were divided between clips and 3-0 silk ties. The vein was ligated distally and irrigated out with heparinized saline. It was a good size vein. A tunnel was created between the antecubital incision and the axillary incision and the vein was tunneled between the incisions. Patient was then heparinized. The brachial artery was dissected free beneath the fascia. It  was clamped proximally and distally. A longitudinal arteriotomy was made. The vein was spatulated and sewn end to side to the artery using continuous 6-0 Prolene suture. At completion was an excellent thrill in the fistula. There was a good radial and ulnar signal with the Doppler the heparin was partially reversed with protamine. The wounds were closed with deep layer 3-0 Vicryl and the skin closed with 4-0 Vicryl. Dermabond was applied. Patient tolerated the procedure well and was transferred to the recovery room in stable condition. All needle and sponge counts were correct.  Waverly Ferrari, MD, FACS [facility]  DATE OF DICTATION:   11/21/2011

## 2011-11-22 DIAGNOSIS — I214 Non-ST elevation (NSTEMI) myocardial infarction: Principal | ICD-10-CM

## 2011-11-22 LAB — CBC
Hemoglobin: 9.2 g/dL — ABNORMAL LOW (ref 12.0–15.0)
Platelets: 244 10*3/uL (ref 150–400)
RBC: 3.14 MIL/uL — ABNORMAL LOW (ref 3.87–5.11)
WBC: 11.3 10*3/uL — ABNORMAL HIGH (ref 4.0–10.5)

## 2011-11-22 LAB — GLUCOSE, CAPILLARY
Glucose-Capillary: 307 mg/dL — ABNORMAL HIGH (ref 70–99)
Glucose-Capillary: 168 mg/dL — ABNORMAL HIGH (ref 70–99)

## 2011-11-22 LAB — BASIC METABOLIC PANEL
Calcium: 9.6 mg/dL (ref 8.4–10.5)
GFR calc Af Amer: 13 mL/min — ABNORMAL LOW (ref >90)
GFR calc non Af Amer: 11 mL/min — ABNORMAL LOW (ref >90)
Potassium: 4.3 mEq/L (ref 3.5–5.1)
Sodium: 134 mEq/L — ABNORMAL LOW (ref 135–145)

## 2011-11-22 MED ORDER — ISOSORBIDE MONONITRATE ER 30 MG PO TB24: 30.0000 mg | ORAL_TABLET | Freq: Every day | ORAL | Status: DC

## 2011-11-22 MED ORDER — NITROGLYCERIN 0.4 MG SL SUBL: 0.4000 mg | SUBLINGUAL_TABLET | SUBLINGUAL | Status: DC | PRN

## 2011-11-22 MED ORDER — METOPROLOL SUCCINATE ER 25 MG PO TB24: 25.0000 mg | ORAL_TABLET | Freq: Every day | ORAL | Status: DC

## 2011-11-22 MED ORDER — HYDROCODONE-ACETAMINOPHEN 5-325 MG PO TABS: 1.0000 | ORAL_TABLET | Freq: Four times a day (QID) | ORAL | Status: AC | PRN

## 2011-11-22 MED ORDER — HYDRALAZINE HCL 10 MG PO TABS: 10.0000 mg | ORAL_TABLET | Freq: Three times a day (TID) | ORAL | Status: DC

## 2011-11-22 MED ORDER — ROSUVASTATIN CALCIUM 10 MG PO TABS: 10.0000 mg | ORAL_TABLET | Freq: Every day | ORAL | Status: DC

## 2011-11-22 MED ORDER — HYDROCODONE-ACETAMINOPHEN 5-325 MG PO TABS: 1.0000 | ORAL_TABLET | Freq: Four times a day (QID) | ORAL | Status: DC | PRN

## 2011-11-22 MED ORDER — ROSUVASTATIN CALCIUM 20 MG PO TABS: 20.0000 mg | ORAL_TABLET | Freq: Every day | ORAL | Status: DC

## 2011-11-22 MED ORDER — FUROSEMIDE 80 MG PO TABS: 160.0000 mg | ORAL_TABLET | Freq: Two times a day (BID) | ORAL | Status: DC

## 2011-11-22 NOTE — Progress Notes (Signed)
Cardiology Progress Note Patient Name: [name] Date of Encounter: 11/22/2011, 7:56 AM     Subjective  No overnight events. PostOp Day #1 RUA AV fistula. She tolerated the procedure without complication. No complaints today. Denies chest pain or shortness of breath. Feels ready to go home.   Objective   Telemetry: Sinus rhythm with 1st degree AV block, 70s  Medications: . aspirin EC  81 mg Oral Daily  . calcitRIOL  0.25 mcg Oral Daily  .  ceFAZolin (ANCEF) IV  1 g Intravenous On Call  . Chlorhexidine Gluconate Cloth  6 each Topical Q0600  . clopidogrel  75 mg Oral Q breakfast  . darbepoetin (ARANESP) injection - DIALYSIS  60 mcg Subcutaneous Weekly  . enoxaparin (LOVENOX) injection  30 mg Subcutaneous Q24H  . famotidine  20 mg Oral Daily  . furosemide  160 mg Oral TID  . gabapentin  100 mg Oral TID  . hydrALAZINE  10 mg Oral TID  . insulin aspart  0-15 Units Subcutaneous TID WC  . insulin glargine  25 Units Subcutaneous Daily  . insulin glargine  30 Units Subcutaneous QHS  . isosorbide mononitrate  30 mg Oral Daily  . metoprolol succinate  25 mg Oral Daily  . mupirocin ointment  1 application Nasal BID  . pantoprazole  40 mg Oral Q1200  . rosuvastatin  20 mg Oral q1800  . sodium chloride  3 mL Intravenous Q12H    Physical Exam: Temp:  [97 F (36.1 C)-98.5 F (36.9 C)] 98.5 F (36.9 C) (02/27 0457) Pulse Rate:  [54-75] 75  (02/27 0457) Resp:  [15-23] 18  (02/27 0457) BP: (104-128)/(46-69) 128/69 mmHg (02/27 0457) SpO2:  [93 %-100 %] 93 % (02/27 0457) Weight:  [177 lb 11.1 oz (80.6 kg)] 177 lb 11.1 oz (80.6 kg) (02/27 0457)  General: Overweight white female, in no acute distress. Head: Normocephalic, atraumatic, sclera non-icteric, nares are without discharge.  Neck: Supple. Negative for carotid bruits or JVD Lungs: Clear bilaterally to auscultation without wheezes, rales, or rhonchi. Breathing is unlabored. Heart: RRR S1 S2 without murmurs, rubs, or  gallops.  Abdomen: Soft, non-tender, non-distended with normoactive bowel sounds. No rebound/guarding. No obvious abdominal masses. Msk:  Strength and tone appear normal for age. Extremities: RUE ecchymosis, incision sites w/o signs of infection, (+) thrill and bruit. 1+ edema right leg, trace edema on left, distal pulses intact and equal. No clubbing or cyanosis.  Neuro: Alert and oriented X 3. Moves all extremities spontaneously. Psych:  Responds to questions appropriately with a normal affect.   Intake/Output Summary (Last 24 hours) at 11/22/11 0756 Last data filed at 11/21/11 1900  Gross per 24 hour  Intake    540 ml  Output    950 ml  Net   -410 ml    Labs:  Basename 11/22/11 0515 11/21/11 2220 11/21/11 0515 11/20/11 0600  NA 134* -- 136 --  K 4.3 -- 4.1 --  CL 95* -- 96 --  CO2 25 -- 26 --  GLUCOSE 289* 426* -- --  BUN 80* -- 73* --  CREATININE 4.03* -- 3.98* --  CALCIUM 9.6 -- 10.5 --  PHOS -- -- -- 4.1   Basename 11/20/11 0600  ALBUMIN 3.3*   Basename 11/22/11 0515 11/20/11 1835  WBC 11.3* 9.7  HGB 9.2* 9.1*  HCT 28.9* 28.0*  MCV 92.0 90.9  PLT 244 250    Radiology/Studies:   11/16/11 - 2D Echocardiogram Study Conclusions: -  Left ventricle: Hypokinesis mid/apical inferior and mid/apical inferoseptal. Inferolateral wall not seen well. The cavity size was normal. Wall thickness was increased in a pattern of mild LVH. Systolic function was mildly reduced. The estimated ejection fraction was in the range of 45% to 50%. Doppler parameters are consistent with high ventricular filling pressure. - Right atrium: The atrium was mildly dilated  11/15/2011 - CXR Findings: There is stable moderate cardiac silhouette enlargement. Mediastinal and hilar contours appear stable.  Chronic increase in basilar reticular pulmonary markings appears stable.  No consolidation or pleural effusion is evident.  There is osteopenic appearance of the bones.  Ectasia, tortuosity, and  calcifications of the aorta are seen.  IMPRESSION: Moderate cardiac silhouette enlargement.  No pulmonary edema, consolidation, or pleural effusion.  Chronic nonspecific increase in basilar reticular markings are seen.  This may be associated with fibrosis.  No acute superimposed process evident.      Assessment and Plan  36 yof w/ h/o severe CAD, CKD 4-5, DM, who presented to [facility] on 11/15/11 w/ c/o orthopnea and chest pain and ruled in for NSTEMI.  1. Acute on chronic mixed CHF: LVEF mildly decreased (EF 45-50%). Pt diuresing on high-dose oral furosemide. I/O's negative in last 24hrs, net negative 3.3L over hospitalization.  Continue Lasix, Imdur, Hydralazine, Toprol XL. No ACEI/ARB in the setting of RF. 2. Acute on chronic renal failure: Appreciate nephrology care. PostOp Day #1 AV fistula placement.  BUN/Crt 80/4.03 this am. 3. NSTEMI: suspect Type 2 NSTEMI in patient with known multivessel CAD. Stable without chest pain. On ASA/Plavix/Crestor/Toprol/Imdur.  4. Disposition - likely home today.    Signed, HOPE, JESSICA PA-C  Patient seen, examined. Available data reviewed. Agree with findings, assessment, and plan as outlined by [facility], PA-C. Pt is stable follow AV fistula placement yesterday. She will need close follow-up with [facility] and hopefully will be able to stay off of dialysis until graft matures. Would discharge home on furosemide 160 mg BID and other meds as written. She is clinically stable and has had significant improvement during this hospitalization. Needs cardiology follow-up WITHIN 2 weeks.  Tonny Bollman, M.D. 11/22/2011 9:20 AM

## 2011-11-22 NOTE — Progress Notes (Signed)
Patient ID: [name], female   DOB: 1946-01-05, 66 y.o.   MRN: [id number] S:feels good and ready to go home O:BP 130/78  Pulse 73  Temp(Src) 98.5 F (36.9 C) (Oral)  Resp 18  Ht 5\' 6"  (1.676 m)  Wt 80.6 kg (177 lb 11.1 oz)  BMI 28.68 kg/m2  SpO2 93%  Intake/Output Summary (Last 24 hours) at 11/22/11 1240 Last data filed at 11/22/11 0740  Gross per 24 hour  Intake    680 ml  Output    550 ml  Net    130 ml   Weight change: -0.095 kg (-3.4 oz) Gen:WD WN WF in NAD CVS:no rub Resp:decreased BS at bases QMV:HQIONG Ext: trace- 1+ pretib edema, RUE AVF +T/B, large ecchymosis   Lab 11/22/11 0515 11/21/11 2220 11/21/11 0515 11/20/11 1835 11/20/11 0600 11/19/11 0530 11/18/11 0620 11/17/11 0525 11/15/11 1714  NA 134* -- 136 133* 134* 138 134* 134* --  K 4.3 -- 4.1 4.5 4.0 4.6 4.0 4.1 --  CL 95* -- 96 97 98 103 100 99 --  CO2 25 -- 26 21 25 24 22 22  --  GLUCOSE 289* 426* 241* 310* 146* 77 67* -- --  BUN 80* -- 73* 66* 68* 65* 64* 63* --  CREATININE 4.03* -- 3.98* 3.91* 4.13* 4.09* 3.91* 4.14* --  ALB -- -- -- -- -- -- -- -- --  CALCIUM 9.6 -- 10.5 10.0 10.1 10.2 10.0 9.6 --  PHOS -- -- -- -- 4.1 4.0 3.0 -- --  AST -- -- -- -- -- -- 12 -- 18  ALT -- -- -- -- -- -- 11 -- 9   Liver Function Tests:  Lab 11/20/11 0600 11/19/11 0530 11/18/11 0620 11/15/11 1714  AST -- -- 12 18  ALT -- -- 11 9  ALKPHOS -- -- 69 59  BILITOT -- -- 0.3 0.9  PROT -- -- 7.0 6.6  ALBUMIN 3.3* 3.2* 3.3* --   No results found for this basename: LIPASE:3,AMYLASE:3 in the last 168 hours No results found for this basename: AMMONIA:3 in the last 168 hours CBC:  Lab 11/22/11 0515 11/20/11 1835 11/20/11 0600 11/19/11 0530 11/18/11 0620 11/15/11 1714  WBC 11.3* 9.7 8.2 -- -- --  NEUTROABS -- -- -- -- -- 9.5*  HGB 9.2* 9.1* 8.6* -- -- --  HCT 28.9* 28.0* 26.8* -- -- --  MCV 92.0 90.9 89.6 91.1 89.6 --  PLT 244 250 238 -- -- --   Cardiac Enzymes:  Lab 11/16/11 1045 11/16/11 0440 11/15/11 2247 11/15/11  1730  CKTOTAL 177 198* 219* 238*  CKMB 8.0* 9.7* 10.4* 10.0*  CKMBINDEX -- -- -- --  TROPONINI 3.84* 5.81* 6.59* 5.19*   CBG:  Lab 11/22/11 1129 11/22/11 0632 11/21/11 2147 11/21/11 1629 11/21/11 1449  GLUCAP 168* 307* 479* 152* 133*    Iron Studies: No results found for this basename: IRON,TIBC,TRANSFERRIN,FERRITIN in the last 72 hours Studies/Results: No results found.    Marland Kitchen aspirin EC  81 mg Oral Daily  . calcitRIOL  0.25 mcg Oral Daily  . Chlorhexidine Gluconate Cloth  6 each Topical Q0600  . clopidogrel  75 mg Oral Q breakfast  . darbepoetin (ARANESP) injection - DIALYSIS  60 mcg Subcutaneous Weekly  . enoxaparin (LOVENOX) injection  30 mg Subcutaneous Q24H  . famotidine  20 mg Oral Daily  . furosemide  160 mg Oral TID  . gabapentin  100 mg Oral TID  . hydrALAZINE  10 mg Oral TID  . insulin  aspart  0-15 Units Subcutaneous TID WC  . insulin glargine  25 Units Subcutaneous Daily  . insulin glargine  30 Units Subcutaneous QHS  . isosorbide mononitrate  30 mg Oral Daily  . metoprolol succinate  25 mg Oral Daily  . mupirocin ointment  1 application Nasal BID  . pantoprazole  40 mg Oral Q1200  . rosuvastatin  20 mg Oral q1800  . sodium chloride  3 mL Intravenous Q12H  . DISCONTD: famotidine  20 mg Oral Daily    BMET    Component Value Date/Time   NA 134* 11/22/2011 0515   K 4.3 11/22/2011 0515   CL 95* 11/22/2011 0515   CO2 25 11/22/2011 0515   GLUCOSE 289* 11/22/2011 0515   BUN 80* 11/22/2011 0515   CREATININE 4.03* 11/22/2011 0515   CALCIUM 9.6 11/22/2011 0515   CALCIUM 9.6 08/28/2011 0937   GFRNONAA 11* 11/22/2011 0515   GFRAA 13* 11/22/2011 0515   CBC    Component Value Date/Time   WBC 11.3* 11/22/2011 0515   RBC 3.14* 11/22/2011 0515   HGB 9.2* 11/22/2011 0515   HCT 28.9* 11/22/2011 0515   PLT 244 11/22/2011 0515   MCV 92.0 11/22/2011 0515   MCH 29.3 11/22/2011 0515   MCHC 31.8 11/22/2011 0515   RDW 15.8* 11/22/2011 0515   LYMPHSABS 0.9 11/15/2011 1714   MONOABS 0.8  11/15/2011 1714   EOSABS 0.1 11/15/2011 1714   BASOSABS 0.0 11/15/2011 1714     Assessment/Plan: 1. AKI/CKD- creat up somewhat with increased diuresis. S/p placement of permanent access and hopefully can hold off HD until access has matured.  Creat stable and ok for discharge.  Pt to f/u with Dr. Kathrene Bongo on 12/13/11 at 11am 2. Acute on chronic CHF- diuresing well, cont with diuretics 3. Vascular access- RUE AVF per Dr. Edilia Bo  4. Anemia- chronic disease/acute illness, now ABLA. Was on epo as outpt, not on current med list, will dose with aranesp and follow iron stores 5. HTN- stable 6. CHF- improved however pt had pork rinds at her bedside.  I discussed with her the need for fluid and sodium restriction to help prevent acute CHF and worsening renal function 7. dispo- per cards.  Stable from renal standpoint and f/u with Dr. Kathrene Bongo as above 8.  [name]

## 2011-11-22 NOTE — Anesthesia Postprocedure Evaluation (Signed)
  Anesthesia Post-op Note  Patient: [name]  Procedure(s) Performed: Procedure(s) (LRB): ARTERIOVENOUS (AV) FISTULA CREATION (N/A)  Patient Location: Nursing Unit  Anesthesia Type: MAC  Level of Consciousness: awake, alert  and oriented  Airway and Oxygen Therapy: Patient Spontanous Breathing  Post-op Pain: none  Post-op Assessment: Post-op Vital signs reviewed, Patient's Cardiovascular Status Stable, Respiratory Function Stable, Patent Airway, No signs of Nausea or vomiting, Adequate PO intake and Pain level controlled  Post-op Vital Signs: Reviewed and stable  Complications: No apparent anesthesia complications

## 2011-11-22 NOTE — Progress Notes (Signed)
  VASCULAR AND VEIN SURGERY PROGRESS NOTE  Progress note  Date of Surgery: 11/15/2011 - 11/21/2011 Surgeon: Moishe Spice): Chuck Hint, MD 1 Day Post-Op Right Procedure(s): ARTERIOVENOUS (AV) FISTULA CREATION   HPI: [name] is a 66 y.o. female [name] is a 66 y.o. female had a recent myocardial infarction. She's had progressive renal insufficiency and we were asked to place long-term access.       Significant Diagnostic Studies: CBC    Component Value Date/Time   WBC 11.3* 11/22/2011 0515   RBC 3.14* 11/22/2011 0515   HGB 9.2* 11/22/2011 0515   HCT 28.9* 11/22/2011 0515   PLT 244 11/22/2011 0515   MCV 92.0 11/22/2011 0515   MCH 29.3 11/22/2011 0515   MCHC 31.8 11/22/2011 0515   RDW 15.8* 11/22/2011 0515   LYMPHSABS 0.9 11/15/2011 1714   MONOABS 0.8 11/15/2011 1714   EOSABS 0.1 11/15/2011 1714   BASOSABS 0.0 11/15/2011 1714    BMET    Component Value Date/Time   NA 134* 11/22/2011 0515   K 4.3 11/22/2011 0515   CL 95* 11/22/2011 0515   CO2 25 11/22/2011 0515   GLUCOSE 289* 11/22/2011 0515   BUN 80* 11/22/2011 0515   CREATININE 4.03* 11/22/2011 0515   CALCIUM 9.6 11/22/2011 0515   CALCIUM 9.6 08/28/2011 0937   GFRNONAA 11* 11/22/2011 0515   GFRAA 13* 11/22/2011 0515    COAG Lab Results  Component Value Date   INR 1.30 11/15/2011   INR 1.1 07/11/2007   No results found for this basename: PTT     I/O last 3 completed shifts: In: 540 [P.O.:240; I.V.:300] Out: 1550 [Urine:1550]  Physical Examination  Patient Vitals for the past 24 hrs:  BP Temp Temp src Pulse Resp SpO2 Weight  11/22/11 0457 128/69 mmHg 98.5 F (36.9 C) Oral 75  18  93 % 177 lb 11.1 oz (80.6 kg)  11/21/11 2148 112/64 mmHg 97.5 F (36.4 C) Oral 70  18  100 % -  11/21/11 2038 105/54 mmHg 97.7 F (36.5 C) Oral 69  18  98 % -  11/21/11 1546 104/60 mmHg 97.8 F (36.6 C) Oral 57  22  97 % -  11/21/11 1515 110/46 mmHg - - 54  23  96 % -  11/21/11 1509 109/48 mmHg - - 54  15  95 % -  11/21/11  1449 112/65 mmHg 97 F (36.1 C) - 55  18  95 % -    Pt Incision is clean, dry, intact or healing well, skin color is normal, no cyanosis, jaundice, pallor or bruising, normal.  Assessment/Plan  [name] is a 66 y.o. year old female who is S/P Procedure(s):  ARTERIOVENOUS (AV) FISTULA CREATION  F/U in the office with Dr. Edilia Bo in 6 weeks.  Clinton Gallant [facility] 11/22/2011 8:04 AM

## 2011-11-22 NOTE — OR Nursing (Signed)
Late entry due to computer downtime

## 2011-11-22 NOTE — Progress Notes (Signed)
VASCULAR PROGRESS NOTE  SUBJECTIVE: pain right arm well controlled.  PHYSICAL EXAM: Filed Vitals:   11/21/11 1546 11/21/11 2038 11/21/11 2148 11/22/11 0457  BP: 104/60 105/54 112/64 128/69  Pulse: 57 69 70 75  Temp: 97.8 F (36.6 C) 97.7 F (36.5 C) 97.5 F (36.4 C) 98.5 F (36.9 C)  TempSrc: Oral Oral Oral Oral  Resp: 22 18 18 18   Height:      Weight:    177 lb 11.1 oz (80.6 kg)  SpO2: 97% 98% 100% 93%   Good thrill in right upper arm AV fistula. Right hand warm and well perfused.  LABS: Lab Results  Component Value Date   WBC 11.3* 11/22/2011   HGB 9.2* 11/22/2011   HCT 28.9* 11/22/2011   MCV 92.0 11/22/2011   PLT 244 11/22/2011   Lab Results  Component Value Date   CREATININE 4.03* 11/22/2011   Lab Results  Component Value Date   INR 1.30 11/15/2011   CBG (last 3)   Basename 11/22/11 0632 11/21/11 2147 11/21/11 1629  GLUCAP 307* 479* 152*     ASSESSMENT/PLAN: 1. 1 Day Post-Op s/p: right basilic vein transposition. 2. I'll arrange follow up in 6 weeks. Okay to discharge from our standpoint. We'll be available as needed.  Waverly Ferrari, MD, FACS Beeper: [phone] 11/22/2011

## 2011-11-22 NOTE — Progress Notes (Signed)
1530 - pt d/c home with instructions, r/x, and f/u appointments.  Pt and husband verbalized understanding of instructions, home with husband, escorted out by Ander Purpura, RN.  Estevan Ryder 11/22/2011

## 2011-11-22 NOTE — Discharge Summary (Signed)
Discharge Summary   Patient ID: [name] MRN: [id number], DOB/AGE: [birth year] 65 y.o.  Primary MD: Rudi Heap, MD Primary Cardiologist: Tonny Bollman MD  Admit date: 11/15/2011 D/C date:     11/22/2011     Primary Discharge Diagnoses:  1. NSTEMI  - Cardiac enzymes trended down  - Suspected likely due to CHF and RF w/ background of severe 3V dz  - Not a candidate for revascularization, medical therapy only  2. 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 Acute on Chronic Renal Failure, Stage 5  - s/p AV graft '09, never required dialysis, occluded  - s/p RUE AV fistula 11/21/11  - Will follow up with [facility] (Dr. Edilia Bo)  - Crt 4.03 at discharge  Secondary Discharge Diagnoses:  1. CAD - NSTEMI '06 cath severe diffuse 3V dz w/ Nl EF poor revasc candidate, NSTEMI '13 as above 2. IDDM, Type 2 3. HTN 4. HLD - LD 72 on 11/16/11, on statin 5. Heart murmur 6. Carotid artery occlusion s/p L CEA '08 7. CVA 8. PVD 9. Hiatal Hernia 10. GERD 11. Iron Deficiency Anemia 12. DJD 13. Cataracts s/p extraction 14. H/o Retinal detachment R eye '09 15. Neuropathy 16. Ulcer R foot 17. H/o osteomyelitis R foot  Allergies Allergen Reactions  . Codeine unknown    Diagnostic Studies/Procedures:   11/21/11 - Exploration of right upper arm cephalic vein. Right Basilic vein transposition FINDINGS: the upper arm cephalic vein had several areas of narrowing noted with a 3 mm dilator. The vein was sclerotic and small. The only option for a fistula on the right was a basilic vein transposition.  11/16/11 - 2D Echocardiogram Study  Conclusions:  - Left ventricle: Hypokinesis mid/apical inferior and mid/apical inferoseptal. Inferolateral wall not seen well. The cavity size was normal. Wall thickness was increased in a pattern of mild LVH. Systolic function was mildly reduced. The estimated ejection fraction was in the range of 45% to 50%. Doppler parameters are consistent with high ventricular filling pressure. - Right atrium: The atrium was mildly dilated.   History of Present Illness: 66 y.o. female w/ the above problems who presented to [facility] on 11/15/11 with complaints of orthopnea and chest pain who was found to be in acute on chronic mixed CHF, acute on chronic renal failure and ruled in for NSTEMI.  She had a nstemi in 2006 w/ cath at that time showing severe, diffuse, small vessel dzs w/o targets amenable to revascularization and has been medically managed. On 11/13/11 she noted DOE all day and orthopnea and chest pressure that night. The night prior to presentation she had recurrent orthopnea requiring her to sleep in a recliner and therefore presented to her PCP on day of presentation where her EKG showed more pronounced lateral ST depressions and was sent to the ED.  Hospital Course:  In the ED, EKG showed sinus rhythm w/ 1st deg AVB 69bpm, w/ poor R wave progression and more pronounced ST depressions in I & aVL, CXR was w/o acute findings, and cardiac enzymes were positive. She had evidence of volume overload on exam, but was otherwise stable.  She was admitted for further evaluation and treatment.  Cardiac enzymes were cycled and trended down. It was suspected the NSTEMI was related to CHF and renal failure with a background of known severe 3V CAD. Echocardiogram completed on 11/16/11 revealed hypokinesis mid/apical inferior and mid/apical inferoseptal, mild LVH, EF 45-50%, high LV filling pressure, and mildly dilated RA. No further ischemic work up was felt necessary as she does not have revascularization  options and the risk of contrast induced nephropathy leading to ESRD was significant, and therefore continued medical therapy was felt to be her best option.   She was gently diuresed with close monitoring of renal function. She remained volume overloaded initially with minimal response to IV lasix and elevation in BUN/Crt. Nephrology evaluated her and recommended permissive HTN and continued diuresis, as well as placement of an AV fistula for possible dialysis. Diuresis and symptoms improved and creatinine stabilized. Vascular surgery evaluated her for hemodialysis access and she had vein mapping of both upper extremities that showed the right upper arm cephalic vein and basilic vein were patent. She underwent right upper arm AV fistula creation on 11/21/11 without complications. She tolerated the procedure well and the incisions were without signs of infection with positive bruit and thrill. She continued to diurese with some increase in her creatinine. She will need close follow up with [facility] and hopefully be able to stay off dialysis until graft matures.  She was seen and evaluated by Dr. Excell Seltzer who felt she was stable for discharge home with plans for follow up as scheduled below. Discharge weight was 177lbs (80.6kg) down from 179lbs (81.6kg) on admission.  Discharge Vitals: Blood pressure 130/78, pulse 73, temperature 98.5 F (36.9 C), temperature source Oral, resp. rate 18, height 5\' 6"  (1.676 m), weight 177 lb 11.1 oz (80.6 kg), SpO2 93.00%.  Labs: Component Value Date   WBC 11.3* 11/22/2011   HGB 9.2* 11/22/2011   HCT 28.9* 11/22/2011   MCV 92.0 11/22/2011   PLT 244 11/22/2011    Lab 11/22/11 0515 11/18/11 0620  NA 134* --  K 4.3 --  CL 95* --  CO2 25 --  BUN 80* --  CREATININE 4.03* --  CALCIUM 9.6 --  PROT -- 7.0  BILITOT -- 0.3  ALKPHOS -- 69  ALT -- 11  AST -- 12  GLUCOSE 289* --   Component Value Date   CHOL 113 11/16/2011   HDL 21* 11/16/2011   LDLCALC 72 11/16/2011    TRIG 98 11/16/2011     11/15/2011 17:30 11/15/2011 22:47 11/16/2011 04:40 11/16/2011 10:45  CK, MB 10.0 (HH) 10.4 (HH) 9.7 (HH) 8.0 (HH)  CK Total 238 (H) 219 (H) 198 (H) 177  Troponin I 5.19 (HH) 6.59 (HH) 5.81 (HH) 3.84 (HH)     11/15/2011 17:14 11/16/2011 04:40  Pro B Natriuretic peptide (BNP) >70000.0 (H) >70000.0 (H)     11/17/2011 14:59  Iron 37 (L)  UIBC 142  TIBC 179 (L)  Saturation Ratios 21     11/17/2011 14:59  PTH 217.3 (H)     11/15/2011 22:47  TSH 3.427     11/18/2011 06:38  Sodium, Ur 70  Creatinine, Urine 19.83    Discharge Medications   Medication List  As of 11/22/2011 12:11 PM   STOP taking these medications         famotidine 40 MG tablet      metoprolol tartrate 25 MG tablet      potassium chloride 20 MEQ packet  pravastatin 40 MG tablet      torsemide 100 MG tablet         TAKE these medications         ASPIRIN LOW DOSE 81 MG EC tablet   Generic drug: aspirin   Take 81 mg by mouth daily.      calcitRIOL 0.25 MCG capsule   Commonly known as: ROCALTROL   Take 0.25 mcg by mouth daily.      furosemide 80 MG tablet   Commonly known as: LASIX   Take 2 tablets (160 mg total) by mouth 2 (two) times daily.      gabapentin 100 MG tablet   Commonly known as: NEURONTIN   Take 100 mg by mouth 3 (three) times daily.      glucose blood test strip   1 each by Other route 3 (three) times daily. Use as instructed      Glucose Blood Kit   by In Vitro route.      hydrALAZINE 10 MG tablet   Commonly known as: APRESOLINE   Take 1 tablet (10 mg total) by mouth 3 (three) times daily.      insulin aspart 100 UNIT/ML injection   Commonly known as: novoLOG   Inject 4-12 Units into the skin 3 (three) times daily before meals. Sliding scale         isosorbide mononitrate 30 MG 24 hr tablet   Commonly known as: IMDUR   Take 1 tablet (30 mg total) by mouth daily.      Lancets Misc   by Does not apply route 3 (three) times daily.      LANTUS 100  UNIT/ML injection   Generic drug: insulin glargine   Inject 25-30 Units into the skin 2 (two) times daily. Inject 30 units in the am and 25 units in the pm daily         metoprolol succinate 25 MG 24 hr tablet   Commonly known as: TOPROL-XL   Take 1 tablet (25 mg total) by mouth daily.      nitroGLYCERIN 0.4 MG SL tablet   Commonly known as: NITROSTAT   Place 1 tablet (0.4 mg total) under the tongue every 5 (five) minutes x 3 doses as needed for chest pain (up to 3 doses).      pantoprazole 40 MG tablet   Commonly known as: PROTONIX   Take 40 mg by mouth daily.      PLAVIX 75 MG tablet   Generic drug: clopidogrel   Take 75 mg by mouth daily.      potassium chloride SA 20 MEQ tablet   Commonly known as: K-DUR,KLOR-CON   Take 20 mEq by mouth daily as needed. For cramping      Prodigy Autocode Blood Glucose W/DEVICE Kit   by Does not apply route 3 (three) times daily.      rosuvastatin 10 MG tablet   Commonly known as: CRESTOR   Take 1 tablet (10 mg total) by mouth at bedtime.      [employer] For Her Open Shoe Misc   by Does not apply route. Patient has Rx for diabetic shoes               Disposition   Discharge Orders    Future Appointments: Provider: Department: Dept Phone: Center:   11/24/2011 10:00 AM [facility]  None   11/28/2011 2:00 PM Beatrice Lecher, PA [facility] [phone] [facility]   11/28/2011  3:00 PM [facility] [phone] [facility]   12/08/2011 10:00 AM [facility]  None   12/22/2011 10:00 AM [facility]  None   01/03/2012 1:45 PM Chuck Hint, MD [facility] [phone] [facility]     Future Orders Please Complete By Expires   Diet - low sodium heart healthy      Increase activity slowly      Discharge instructions      Comments:   **PLEASE REMEMBER TO BRING ALL OF YOUR MEDICATIONS TO EACH OF YOUR FOLLOW-UP OFFICE  VISITS.  * Please see hand out from [facility] for instructions regarding care of your new AV fistula. Call [phone] with questions.     Follow-up Information    Follow up with DICKSON,CHRISTOPHER S, MD in 6 weeks. (Office will call you to arrange your appt (sent))    Contact information:   [address] [place] [address] [phone]       Follow up with Tereso Newcomer, PA on 11/28/2011. (2:00)    Contact information:   [facility] [address] [place] [address] [phone]           Outstanding Labs/Studies:  1. BMET at next clinic visit 2.  Patient will require follow up lipid panel and liver function tests in 6-8 weeks as we changed her statin during this hospitalization   Duration of Discharge Encounter: Greater than 30 minutes including physician and PA time.  Signed, Adel Neyer PA-C 11/22/2011, 12:11 PM

## 2011-11-23 ENCOUNTER — Encounter (HOSPITAL_COMMUNITY): Payer: Self-pay | Admitting: Vascular Surgery

## 2011-11-23 NOTE — Discharge Summary (Signed)
Agree as above. See my progress note this same date.  Tonny Bollman 11/23/2011 7:44 AM

## 2011-11-24 ENCOUNTER — Encounter (HOSPITAL_COMMUNITY): Payer: Medicare Other

## 2011-11-28 ENCOUNTER — Ambulatory Visit (INDEPENDENT_AMBULATORY_CARE_PROVIDER_SITE_OTHER): Payer: Medicare Other | Admitting: Physician Assistant

## 2011-11-28 ENCOUNTER — Encounter: Payer: Self-pay | Admitting: Physician Assistant

## 2011-11-28 ENCOUNTER — Ambulatory Visit (INDEPENDENT_AMBULATORY_CARE_PROVIDER_SITE_OTHER): Payer: Medicare Other | Admitting: *Deleted

## 2011-11-28 ENCOUNTER — Telehealth: Payer: Self-pay | Admitting: Physician Assistant

## 2011-11-28 VITALS — BP 118/68 | HR 60 | Ht 66.0 in | Wt 178.0 lb

## 2011-11-28 DIAGNOSIS — E785 Hyperlipidemia, unspecified: Secondary | ICD-10-CM

## 2011-11-28 DIAGNOSIS — I5042 Chronic combined systolic (congestive) and diastolic (congestive) heart failure: Secondary | ICD-10-CM

## 2011-11-28 DIAGNOSIS — I2510 Atherosclerotic heart disease of native coronary artery without angina pectoris: Secondary | ICD-10-CM

## 2011-11-28 DIAGNOSIS — I5033 Acute on chronic diastolic (congestive) heart failure: Secondary | ICD-10-CM

## 2011-11-28 DIAGNOSIS — I10 Essential (primary) hypertension: Secondary | ICD-10-CM

## 2011-11-28 DIAGNOSIS — N184 Chronic kidney disease, stage 4 (severe): Secondary | ICD-10-CM

## 2011-11-28 DIAGNOSIS — I509 Heart failure, unspecified: Secondary | ICD-10-CM

## 2011-11-28 LAB — BASIC METABOLIC PANEL
BUN: 77 mg/dL — ABNORMAL HIGH (ref 6–23)
Calcium: 9.2 mg/dL (ref 8.4–10.5)
Chloride: 92 mEq/L — ABNORMAL LOW (ref 96–112)
Creatinine, Ser: 4.7 mg/dL (ref 0.4–1.2)
GFR: 9.96 mL/min — CL (ref >60.00)

## 2011-11-28 MED ORDER — METOLAZONE 2.5 MG PO TABS: ORAL_TABLET | ORAL | Status: DC

## 2011-11-28 NOTE — Patient Instructions (Addendum)
Your physician recommends that you schedule a follow-up appointment in: 4-6 weeks with Dr Excell Seltzer Your physician recommends that you have for lab work drawn today (BMP) Your physician recommends that you return for lab work in: 6 weeks for Lipid and Liver profile Your physician has recommended you make the following change in your medication: TAKE Metolazone 2.5 mg on M, W, F only 30 minutes prior to your AM dose of Furosemide DO NOT TAKE Potassium unless Dr Norvel Richards says to Please tel Dr Norvel Richards of changes and request a BMP in 2 weeks

## 2011-11-28 NOTE — Telephone Encounter (Signed)
Called [name] regarding a critical lab value today. Cr is elevated at 4.68, up from 4.03 on 02/27. GFR has decreased as well. Patient has CKD, stage V with recent AV fistula placed. This is maturing, but eventually require HD. Glucose was 388 today. Patient admitted she had not taken insulin prior to having labs drawn today. She monitors her BGs regularly and adjusts her insulin accordingly. I advised to make sure she continues to do so as 388 is far too high. She understood, agreed and stated that she was asymptomatic.   Jacqulyn Bath, PA-C 11/28/2011 6:12 PM

## 2011-11-28 NOTE — Progress Notes (Addendum)
[address] [place], [place]  [address] Phone: [phone] Fax:  [phone]  Date:  11/28/2011   Name:  [name]       DOB:  [birth year] MRN:  [id number]  PCP:  Dr. Rudi Heap Primary Cardiologist:  Dr. Tonny Bollman  Primary Electrophysiologist:  None    History of Present Illness: [name] is a 66 y.o. female who presents for post hospital follow up.  She has a history of severe three-vessel CAD that is not amenable to PCI or bypass surgery (cardiac catheterization 12/06) and is treated medically.  She also has peripheral arterial disease, status post left carotid endarterectomy, abnormal ABIs and recent history of left foot ulcer.  She has been followed by Dr. Darrick Penna.  She was admitted 2/20-2/27 With an NSTEMI in the setting of acute on chronic combined systolic and diastolic congestive heart failure and acute on chronic renal failure with known 3 vessel CAD.  No further ischemic workup was recommended due to her known severe disease in no options for intervention.  Plus, with her chronic kidney disease she was felt to be high risk for contrast-induced nephropathy and progression to end-stage renal disease.  Her creatinine increased with diuresis.  She was seen by nephrology.  She had an AV fistula placed for access.  Echocardiogram 11/16/11: Mild LVH, EF 45-50%, mild RAE.  Labs: Hemoglobin 9.2, potassium 4.3, creatinine 4.03, ALT 11, total cholesterol 113, H. 21, LDL 72, treatments read 98, peak troponin 6.59, TSH 3.47.  Since discharge, she is stable.  No chest pain.  She notes chronic DOE.  Probably describes Class 3.  She notes increased cough last night with lying flat.  Sleeps on 2 pillows typically.  No PND.  LE edema about the same.  She denies syncope.  She sees Dr. Kathrene Bongo in 2 weeks.    Past Medical History  Diagnosis Date  . IDDM (insulin dependent diabetes mellitus)   . CAD (coronary artery disease)     a. 08/2005 - NSTEMI - Cath:  Severe diffuse 3vd w/ NL EF - poor revasc candidate.;  b. 07/2012 Myoview - large area of ant, dist inf, & apical infarct w/ mild to mod peri-inf ischemia.  transient ischemic dilatation of the LV cavity  . Neuropathy   . Ulcer of foot     a. R foot - followed by Dr[facility]  . Hypertension   . DJD (degenerative joint disease)   . Cataracts, bilateral 07/15/10  . Hyperlipidemia   . CKD (chronic kidney disease) stage 4, GFR 15-29 ml/min     a. s/p AVG  . GERD (gastroesophageal reflux disease)   . Hiatal hernia   . Heart murmur   . Joint pain   . Dizziness   . Carotid artery occlusion     a. s/p left CEA 06/2007;  b. 07/2011 u/s - RICA 1-39%, LICA patent  . Diastolic CHF, chronic   . History of stroke   . Iron deficiency anemia   . History of retinal detachment     a. right eye - repair 11/2007  . History of osteomyelitis     a. Right foot  . PVD (peripheral vascular disease)     a. ABI's 06/2011: R-37, L - 84     Current Outpatient Prescriptions  Medication Sig Dispense Refill  . aspirin (ASPIRIN LOW DOSE) 81 MG EC tablet Take 81 mg by mouth daily.        Marland Kitchen  Blood Glucose Monitoring Suppl (PRODIGY AUTOCODE BLOOD GLUCOSE) W/DEVICE KIT by Does not apply route 3 (three) times daily.        . calcitRIOL (ROCALTROL) 0.25 MCG capsule Take 0.25 mcg by mouth daily.        . clopidogrel (PLAVIX) 75 MG tablet Take 75 mg by mouth daily.        . Foot Care Products [facility] FOR HER OPEN SHOE) MISC by Does not apply route. Patient has Rx for diabetic shoes         . furosemide (LASIX) 80 MG tablet Take 2 tablets (160 mg total) by mouth 2 (two) times daily.  120 tablet  3  . gabapentin (NEURONTIN) 100 MG tablet Take 100 mg by mouth 3 (three) times daily.        . Glucose Blood KIT by In Vitro route.        Marland Kitchen glucose blood test strip 1 each by Other route 3 (three) times daily. Use as instructed       . hydrALAZINE (APRESOLINE) 10 MG tablet Take 1 tablet (10 mg total) by  mouth 3 (three) times daily.  90 tablet  3  . HYDROcodone-acetaminophen (NORCO) 5-325 MG per tablet Take 1 tablet by mouth every 6 (six) hours as needed.  30 tablet  0  . insulin aspart (NOVOLOG) 100 UNIT/ML injection Inject 4-12 Units into the skin 3 (three) times daily before meals. Sliding scale        . insulin glargine (LANTUS) 100 UNIT/ML injection Inject 25-30 Units into the skin 2 (two) times daily. Inject 30 units in the am and 25 units in the pm daily        . isosorbide mononitrate (IMDUR) 30 MG 24 hr tablet Take 1 tablet (30 mg total) by mouth daily.  30 tablet  3  . Lancets MISC by Does not apply route 3 (three) times daily.        . metoprolol succinate (TOPROL-XL) 25 MG 24 hr tablet Take 1 tablet (25 mg total) by mouth daily.  30 tablet  3  . nitroGLYCERIN (NITROSTAT) 0.4 MG SL tablet Place 1 tablet (0.4 mg total) under the tongue every 5 (five) minutes x 3 doses as needed for chest pain (up to 3 doses).  25 tablet  3  . pantoprazole (PROTONIX) 40 MG tablet Take 40 mg by mouth daily.        . potassium chloride SA (K-DUR,KLOR-CON) 20 MEQ tablet Take 20 mEq by mouth daily as needed. For cramping      . rosuvastatin (CRESTOR) 10 MG tablet Take 1 tablet (10 mg total) by mouth at bedtime.  30 tablet  3    Allergies: Allergies  Allergen Reactions  . Codeine Other (See Comments)    unknown    History  Substance Use Topics  . Smoking status: Former Smoker    Types: Cigarettes  . Smokeless tobacco: Not on file   Comment: smoked a pack/week for about 20 yrs.  Quit about 25 y ago  . Alcohol Use: Yes     rare beer     ROS:  Please see the history of present illness.    All other systems reviewed and negative.   PHYSICAL EXAM: VS:  BP 118/68  Pulse 60  Ht 5\' 6"  (1.676 m)  Wt 178 lb (80.74 kg)  BMI 28.73 kg/m2 Well nourished, well developed, in no acute distress HEENT: normal Neck: + JVD Cardiac:  normal S1, S2; RRR; 1-2 systolic  murmur along LSB  Lungs:  Faint crackles  in the bases bilaterally R>L, no wheezing, rhonchi  Abd: soft, nontender, no hepatomegaly Ext: 1+ bilateral edema Skin: warm and dry Neuro:  CNs 2-12 intact, no focal abnormalities noted  EKG:  Sinus bradycardia, heart rate 59, down-sloping ST depression in leads 1, aVL and minimal ST segment changes in V6, no significant change from prior tracing  ASSESSMENT AND PLAN:  1. Chronic combined systolic and diastolic heart failure  She still has volume overload.  This is mainly driven by her chronic renal failure.  I will place her on Metolazone 2.5 mg on Mon, Wed, Fri.  Check a basic metabolic panel today.  She sees a nephrologist in 2 weeks.  Repeat basic metabolic panel can be obtained through her nephrologist.  Followup with Dr. Excell Seltzer in 4-6 weeks.   2. CAD  Stable.  No angina.  Continue aspirin and Plavix.  Followup in 4-6 weeks with Dr. Excell Seltzer.   3. CKD (chronic kidney disease) stage 4, GFR 15-29 ml/min  Followup with nephrology.  Hopefully dialysis can be held off until her graft matures.   4. HYPERLIPIDEMIA  Statin changed recently.  Check FLP and LFTs in 6 weeks.   5. HYPERTENSION, UNSPECIFIED  Controlled.  Continue current therapy.      Signed, Tereso Newcomer, PA-C  2:20 PM 11/28/2011     Labs have been reviewed and renal function worse. I called patient yesterday to check on her response to metolazone and left message for her to call back. Also called Dr Kathrene Bongo to discuss - she will follow-up closely with the patient.   Tonny Bollman 12/01/2011 5:23 AM

## 2011-12-01 ENCOUNTER — Encounter (INDEPENDENT_AMBULATORY_CARE_PROVIDER_SITE_OTHER): Payer: Medicare Other | Admitting: Ophthalmology

## 2011-12-01 DIAGNOSIS — H43819 Vitreous degeneration, unspecified eye: Secondary | ICD-10-CM

## 2011-12-01 DIAGNOSIS — E11359 Type 2 diabetes mellitus with proliferative diabetic retinopathy without macular edema: Secondary | ICD-10-CM

## 2011-12-01 DIAGNOSIS — E1139 Type 2 diabetes mellitus with other diabetic ophthalmic complication: Secondary | ICD-10-CM

## 2011-12-05 ENCOUNTER — Other Ambulatory Visit: Payer: Self-pay

## 2011-12-05 ENCOUNTER — Inpatient Hospital Stay (HOSPITAL_COMMUNITY)
Admission: EM | Admit: 2011-12-05 | Discharge: 2011-12-13 | DRG: 291 | Disposition: A | Discharge status: Home / Self Care | Payer: Medicare Other | Attending: Cardiology | Admitting: Cardiology

## 2011-12-05 ENCOUNTER — Emergency Department (HOSPITAL_COMMUNITY): Payer: Medicare Other

## 2011-12-05 ENCOUNTER — Encounter (HOSPITAL_COMMUNITY): Payer: Self-pay | Admitting: *Deleted

## 2011-12-05 DIAGNOSIS — E785 Hyperlipidemia, unspecified: Secondary | ICD-10-CM | POA: Insufficient documentation

## 2011-12-05 DIAGNOSIS — E1165 Type 2 diabetes mellitus with hyperglycemia: Secondary | ICD-10-CM | POA: Diagnosis present

## 2011-12-05 DIAGNOSIS — I120 Hypertensive chronic kidney disease with stage 5 chronic kidney disease or end stage renal disease: Secondary | ICD-10-CM | POA: Diagnosis present

## 2011-12-05 DIAGNOSIS — I10 Essential (primary) hypertension: Secondary | ICD-10-CM | POA: Insufficient documentation

## 2011-12-05 DIAGNOSIS — E871 Hypo-osmolality and hyponatremia: Secondary | ICD-10-CM | POA: Diagnosis present

## 2011-12-05 DIAGNOSIS — I509 Heart failure, unspecified: Secondary | ICD-10-CM | POA: Diagnosis present

## 2011-12-05 DIAGNOSIS — Z7902 Long term (current) use of antithrombotics/antiplatelets: Secondary | ICD-10-CM

## 2011-12-05 DIAGNOSIS — Z23 Encounter for immunization: Secondary | ICD-10-CM

## 2011-12-05 DIAGNOSIS — N186 End stage renal disease: Secondary | ICD-10-CM | POA: Diagnosis present

## 2011-12-05 DIAGNOSIS — E119 Type 2 diabetes mellitus without complications: Secondary | ICD-10-CM | POA: Insufficient documentation

## 2011-12-05 DIAGNOSIS — Z7982 Long term (current) use of aspirin: Secondary | ICD-10-CM

## 2011-12-05 DIAGNOSIS — N185 Chronic kidney disease, stage 5: Secondary | ICD-10-CM

## 2011-12-05 DIAGNOSIS — Z794 Long term (current) use of insulin: Secondary | ICD-10-CM

## 2011-12-05 DIAGNOSIS — D631 Anemia in chronic kidney disease: Secondary | ICD-10-CM | POA: Diagnosis present

## 2011-12-05 DIAGNOSIS — I252 Old myocardial infarction: Secondary | ICD-10-CM

## 2011-12-05 DIAGNOSIS — N058 Unspecified nephritic syndrome with other morphologic changes: Secondary | ICD-10-CM | POA: Diagnosis present

## 2011-12-05 DIAGNOSIS — I2510 Atherosclerotic heart disease of native coronary artery without angina pectoris: Secondary | ICD-10-CM | POA: Insufficient documentation

## 2011-12-05 DIAGNOSIS — Z87891 Personal history of nicotine dependence: Secondary | ICD-10-CM

## 2011-12-05 DIAGNOSIS — E1129 Type 2 diabetes mellitus with other diabetic kidney complication: Secondary | ICD-10-CM | POA: Diagnosis present

## 2011-12-05 DIAGNOSIS — G609 Hereditary and idiopathic neuropathy, unspecified: Secondary | ICD-10-CM | POA: Insufficient documentation

## 2011-12-05 DIAGNOSIS — K219 Gastro-esophageal reflux disease without esophagitis: Secondary | ICD-10-CM | POA: Insufficient documentation

## 2011-12-05 DIAGNOSIS — R0602 Shortness of breath: Secondary | ICD-10-CM

## 2011-12-05 DIAGNOSIS — N289 Disorder of kidney and ureter, unspecified: Secondary | ICD-10-CM

## 2011-12-05 DIAGNOSIS — Z833 Family history of diabetes mellitus: Secondary | ICD-10-CM

## 2011-12-05 DIAGNOSIS — I739 Peripheral vascular disease, unspecified: Secondary | ICD-10-CM | POA: Insufficient documentation

## 2011-12-05 DIAGNOSIS — N2581 Secondary hyperparathyroidism of renal origin: Secondary | ICD-10-CM | POA: Diagnosis present

## 2011-12-05 DIAGNOSIS — E1310 Other specified diabetes mellitus with ketoacidosis without coma: Secondary | ICD-10-CM | POA: Diagnosis present

## 2011-12-05 DIAGNOSIS — I5042 Chronic combined systolic (congestive) and diastolic (congestive) heart failure: Secondary | ICD-10-CM | POA: Insufficient documentation

## 2011-12-05 DIAGNOSIS — I5043 Acute on chronic combined systolic (congestive) and diastolic (congestive) heart failure: Principal | ICD-10-CM | POA: Diagnosis present

## 2011-12-05 HISTORY — DX: Chronic kidney disease, stage 5: N18.5

## 2011-12-05 LAB — CBC
MCH: 29.8 pg (ref 26.0–34.0)
MCHC: 31.9 g/dL (ref 30.0–36.0)
Platelets: 168 10*3/uL (ref 150–400)
Platelets: 153 10*3/uL (ref 150–400)
RBC: 3 MIL/uL — ABNORMAL LOW (ref 3.87–5.11)
RBC: 2.99 MIL/uL — ABNORMAL LOW (ref 3.87–5.11)
RDW: 16.7 % — ABNORMAL HIGH (ref 11.5–15.5)
WBC: 7.8 10*3/uL (ref 4.0–10.5)

## 2011-12-05 LAB — BASIC METABOLIC PANEL
BUN: 84 mg/dL — ABNORMAL HIGH (ref 6–23)
CO2: 19 mEq/L (ref 19–32)
Calcium: 9.4 mg/dL (ref 8.4–10.5)
Chloride: 87 mEq/L — ABNORMAL LOW (ref 96–112)
Creatinine, Ser: 4.65 mg/dL — ABNORMAL HIGH (ref 0.50–1.10)
GFR calc Af Amer: 10 mL/min — ABNORMAL LOW (ref >90)
GFR calc Af Amer: 10 mL/min — ABNORMAL LOW (ref >90)
GFR calc non Af Amer: 9 mL/min — ABNORMAL LOW (ref >90)
Potassium: 3.7 mEq/L (ref 3.5–5.1)
Potassium: 3.8 mEq/L (ref 3.5–5.1)
Sodium: 128 mEq/L — ABNORMAL LOW (ref 135–145)

## 2011-12-05 LAB — DIFFERENTIAL
Basophils Absolute: 0.1 10*3/uL (ref 0.0–0.1)
Eosinophils Relative: 0 % (ref 0–5)
Lymphocytes Relative: 7 % — ABNORMAL LOW (ref 12–46)
Monocytes Relative: 4 % (ref 3–12)
Neutrophils Relative %: 88 % — ABNORMAL HIGH (ref 43–77)

## 2011-12-05 LAB — CREATININE, SERUM: Creatinine, Ser: 4.77 mg/dL — ABNORMAL HIGH (ref 0.50–1.10)

## 2011-12-05 LAB — HEPATIC FUNCTION PANEL
Alkaline Phosphatase: 89 U/L (ref 39–117)
Indirect Bilirubin: 0.8 mg/dL (ref 0.3–0.9)
Total Bilirubin: 1.4 mg/dL — ABNORMAL HIGH (ref 0.3–1.2)

## 2011-12-05 LAB — GLUCOSE, CAPILLARY
Glucose-Capillary: 401 mg/dL — ABNORMAL HIGH (ref 70–99)
Glucose-Capillary: 475 mg/dL — ABNORMAL HIGH (ref 70–99)

## 2011-12-05 LAB — MRSA PCR SCREENING: MRSA by PCR: NEGATIVE

## 2011-12-05 LAB — PRO B NATRIURETIC PEPTIDE: Pro B Natriuretic peptide (BNP): 65608 pg/mL — ABNORMAL HIGH (ref 0–125)

## 2011-12-05 MED ORDER — IPRATROPIUM BROMIDE 0.02 % IN SOLN
0.5000 mg | Freq: Once | RESPIRATORY_TRACT | Status: AC
  Administered 2011-12-05: 0.5 mg via RESPIRATORY_TRACT
  Filled 2011-12-05: qty 2.5

## 2011-12-05 MED ORDER — METOPROLOL SUCCINATE ER 25 MG PO TB24
25.0000 mg | ORAL_TABLET | Freq: Every day | ORAL | Status: DC
  Administered 2011-12-06 – 2011-12-08 (×3): 25 mg via ORAL
  Filled 2011-12-05 (×4): qty 1

## 2011-12-05 MED ORDER — CLOPIDOGREL BISULFATE 75 MG PO TABS
75.0000 mg | ORAL_TABLET | Freq: Every day | ORAL | Status: DC
  Administered 2011-12-06 – 2011-12-13 (×8): 75 mg via ORAL
  Filled 2011-12-05 (×8): qty 1

## 2011-12-05 MED ORDER — SODIUM CHLORIDE 0.9 % IV SOLN: 250.0000 mL | INTRAVENOUS | Status: DC | PRN

## 2011-12-05 MED ORDER — INSULIN ASPART 100 UNIT/ML ~~LOC~~ SOLN
0.0000 [IU] | Freq: Three times a day (TID) | SUBCUTANEOUS | Status: DC
  Administered 2011-12-06: 7 [IU] via SUBCUTANEOUS
  Administered 2011-12-06: 1 [IU] via SUBCUTANEOUS

## 2011-12-05 MED ORDER — SODIUM CHLORIDE 0.9 % IJ SOLN
3.0000 mL | Freq: Two times a day (BID) | INTRAMUSCULAR | Status: DC
  Administered 2011-12-05 – 2011-12-13 (×16): 3 mL via INTRAVENOUS

## 2011-12-05 MED ORDER — ALBUTEROL SULFATE (5 MG/ML) 0.5% IN NEBU
5.0000 mg | INHALATION_SOLUTION | Freq: Once | RESPIRATORY_TRACT | Status: AC
  Administered 2011-12-05: 5 mg via RESPIRATORY_TRACT
  Filled 2011-12-05: qty 1

## 2011-12-05 MED ORDER — PREDNISONE 20 MG PO TABS
60.0000 mg | ORAL_TABLET | Freq: Once | ORAL | Status: AC
  Administered 2011-12-05: 60 mg via ORAL
  Filled 2011-12-05: qty 3

## 2011-12-05 MED ORDER — ONDANSETRON HCL 4 MG/2ML IJ SOLN: 4.0000 mg | Freq: Four times a day (QID) | INTRAMUSCULAR | Status: DC | PRN

## 2011-12-05 MED ORDER — HEPARIN SODIUM (PORCINE) 5000 UNIT/ML IJ SOLN
5000.0000 [IU] | Freq: Three times a day (TID) | INTRAMUSCULAR | Status: DC
  Administered 2011-12-05 – 2011-12-07 (×5): 5000 [IU] via SUBCUTANEOUS
  Filled 2011-12-05 (×8): qty 1

## 2011-12-05 MED ORDER — CALCITRIOL 0.25 MCG PO CAPS
0.2500 ug | ORAL_CAPSULE | Freq: Every day | ORAL | Status: DC
  Administered 2011-12-06: 0.25 ug via ORAL
  Filled 2011-12-05 (×2): qty 1

## 2011-12-05 MED ORDER — ASPIRIN 81 MG PO TBEC: 81.0000 mg | DELAYED_RELEASE_TABLET | Freq: Every day | ORAL | Status: DC

## 2011-12-05 MED ORDER — ONDANSETRON HCL 4 MG PO TABS: 4.0000 mg | ORAL_TABLET | Freq: Four times a day (QID) | ORAL | Status: DC | PRN

## 2011-12-05 MED ORDER — SODIUM CHLORIDE 0.9 % IJ SOLN: 3.0000 mL | INTRAMUSCULAR | Status: DC | PRN

## 2011-12-05 MED ORDER — ISOSORBIDE MONONITRATE ER 30 MG PO TB24
30.0000 mg | ORAL_TABLET | Freq: Every day | ORAL | Status: DC
  Administered 2011-12-06 – 2011-12-11 (×6): 30 mg via ORAL
  Filled 2011-12-05 (×6): qty 1

## 2011-12-05 MED ORDER — INSULIN ASPART 100 UNIT/ML ~~LOC~~ SOLN
10.0000 [IU] | SUBCUTANEOUS | Status: AC
  Administered 2011-12-05: 10 [IU] via SUBCUTANEOUS

## 2011-12-05 MED ORDER — INSULIN ASPART 100 UNIT/ML ~~LOC~~ SOLN
12.0000 [IU] | Freq: Once | SUBCUTANEOUS | Status: AC
  Administered 2011-12-05: 12 [IU] via SUBCUTANEOUS
  Filled 2011-12-05: qty 1

## 2011-12-05 MED ORDER — HYDRALAZINE HCL 10 MG PO TABS
10.0000 mg | ORAL_TABLET | Freq: Three times a day (TID) | ORAL | Status: DC
  Administered 2011-12-05 – 2011-12-08 (×8): 10 mg via ORAL
  Filled 2011-12-05 (×13): qty 1

## 2011-12-05 MED ORDER — ASPIRIN EC 81 MG PO TBEC
81.0000 mg | DELAYED_RELEASE_TABLET | Freq: Every day | ORAL | Status: DC
  Administered 2011-12-06 – 2011-12-13 (×8): 81 mg via ORAL
  Filled 2011-12-05 (×8): qty 1

## 2011-12-05 MED ORDER — PANTOPRAZOLE SODIUM 40 MG PO TBEC
40.0000 mg | DELAYED_RELEASE_TABLET | Freq: Every day | ORAL | Status: DC
  Administered 2011-12-06 – 2011-12-13 (×8): 40 mg via ORAL
  Filled 2011-12-05 (×8): qty 1

## 2011-12-05 NOTE — ED Notes (Signed)
Patient reports cough since Friday.  She denies fever,  Denies production.

## 2011-12-05 NOTE — Progress Notes (Signed)
D: received [facility] and she stated she would be up to she Pt and put some orders to cover pt's CBG. Pt stable at this time.

## 2011-12-05 NOTE — Progress Notes (Signed)
D: Paged Attending on-call and spoke with MD. She states that she would contact Hospitalist to manage pt Diabetes.

## 2011-12-05 NOTE — ED Provider Notes (Signed)
History     CSN: [id number]  Arrival date & time 12/05/11  1036   First MD Initiated Contact with Patient 12/05/11 1103      Chief Complaint  Patient presents with  . URI    (Consider location/radiation/quality/duration/timing/severity/associated sxs/prior treatment) HPI  Patient presents to ER complaining of a 5 day hx of gradual onset nasal congestion, sinus pressure that was followed by gradual onset cough with increasing SOB over the last 5 days with ongoing persistent cough. Patient states SOB is aggravated by exertion and lying flat at night to sleep. She denies associated CP, nausea, diaphoresis or swelling of LEs. Patient had N-STEMI approximately 2 weeks ago that she states was associated with CP but gain denies any current CP or CP over the last 5 days. She denies fevers, chills, HA, sore throat, CP, wheezing, abdominal pain, n/v/d, lower extremity pain or production with cough. She has taken no medication for symptoms PTA.    Office Note From Tereso Newcomer of [facility] on 11/28/11 PCP: Dr. Rudi Heap  Primary Cardiologist: Dr. Tonny Bollman  Primary Electrophysiologist: None  History of Present Illness:  [name] is a 66 y.o. female who presents for post hospital follow up. She has a history of severe three-vessel CAD that is not amenable to PCI or bypass surgery (cardiac catheterization 12/06) and is treated medically. She also has peripheral arterial disease, status post left carotid endarterectomy, abnormal ABIs and recent history of left foot ulcer. She has been followed by Dr. Darrick Penna. She was admitted 2/20-2/27 With an NSTEMI in the setting of acute on chronic combined systolic and diastolic congestive heart failure and acute on chronic renal failure with known 3 vessel CAD. No further ischemic workup was recommended due to her known severe disease in no options for intervention. Plus, with her chronic kidney disease she was felt to be high risk for  contrast-induced nephropathy and progression to end-stage renal disease. Her creatinine increased with diuresis. She was seen by nephrology. She had an AV fistula placed for access. Echocardiogram 11/16/11: Mild LVH, EF 45-50%, mild RAE. Labs: Hemoglobin 9.2, potassium 4.3, creatinine 4.03, ALT 11, total cholesterol 113, H. 21, LDL 72, treatments read 98, peak troponin 6.59, TSH 3.47.  Since discharge, she is stable. No chest pain. She notes chronic DOE. Probably describes Class 3. She notes increased cough last night with lying flat. Sleeps on 2 pillows typically. No PND. LE edema about the same. She denies syncope. She sees Dr. Kathrene Bongo in 2 weeks.   EKG: Sinus bradycardia, heart rate 59, down-sloping ST depression in leads 1, aVL and minimal ST segment changes in V6, no significant change from prior tracing  ASSESSMENT AND PLAN:  1.  Chronic combined systolic and diastolic heart failure  She still has volume overload. This is mainly driven by her chronic renal failure. I will place her on Metolazone 2.5 mg on Mon, Wed, Fri. Check a basic metabolic panel today. She sees a nephrologist in 2 weeks. Repeat basic metabolic panel can be obtained through her nephrologist. Followup with Dr. Excell Seltzer in 4-6 weeks.   2.  CAD  Stable. No angina. Continue aspirin and Plavix. Followup in 4-6 weeks with Dr. Excell Seltzer.   3.  CKD (chronic kidney disease) stage 4, GFR 15-29 ml/min  Followup with nephrology. Hopefully dialysis can be held off until her graft matures.   4.  HYPERLIPIDEMIA  Statin changed recently. Check FLP and LFTs in 6 weeks.   5.  HYPERTENSION, UNSPECIFIED  Controlled. Continue current therapy.   Signed,  Tereso Newcomer, PA-C 2:20 PM 11/28/2011    Past Medical History  Diagnosis Date  . IDDM (insulin dependent diabetes mellitus)   . CAD (coronary artery disease)     a. 08/2005 - NSTEMI - Cath: Severe diffuse 3vd w/ NL EF - poor revasc candidate.;  b. 07/2012 Myoview - large area of ant, dist  inf, & apical infarct w/ mild to mod peri-inf ischemia.  transient ischemic dilatation of the LV cavity  . Neuropathy   . Ulcer of foot     a. R foot - followed by Dr. Arbie Cookey & [facility]  . Hypertension   . DJD (degenerative joint disease)   . Cataracts, bilateral 07/15/10  . Hyperlipidemia   . CKD (chronic kidney disease) stage 4, GFR 15-29 ml/min     a. s/p AVG  . GERD (gastroesophageal reflux disease)   . Hiatal hernia   . Heart murmur   . Joint pain   . Dizziness   . Carotid artery occlusion     a. s/p left CEA 06/2007;  b. 07/2011 u/s - RICA 1-39%, LICA patent  . Diastolic CHF, chronic   . History of stroke   . Iron deficiency anemia   . History of retinal detachment     a. right eye - repair 11/2007  . History of osteomyelitis     a. Right foot  . PVD (peripheral vascular disease)     a. ABI's 06/2011: R-37, L - 84     Past Surgical History  Procedure Date  . Cataract extraction   . Arteriovenous graft placement 2009  . Carotid endarterectomy 2008  . Av fistula placement 11/21/2011    Procedure: ARTERIOVENOUS (AV) FISTULA CREATION;  Surgeon: Chuck Hint, MD;  Location: [facility] OR;  Service: Vascular;  Laterality: N/A;  Right basilic vein transposition    Family History  Problem Relation Age of Onset  . Hypertension Other   . Stroke Other   . Heart attack Other   . Cancer Other   . Heart disease Other   . Diabetes Mother   . Cancer Mother     lung - died 15  . Cirrhosis Father     died 47  . Cancer Sister     liver  . Cancer Son     lymphatic    History  Substance Use Topics  . Smoking status: Former Smoker    Types: Cigarettes  . Smokeless tobacco: Not on file   Comment: smoked a pack/week for about 20 yrs.  Quit about 25 y ago  . Alcohol Use: No     rare beer    OB History    Grav Para Term Preterm Abortions TAB SAB Ect Mult Living                  Review of Systems  All other systems reviewed and are  negative.    Allergies  Codeine  Home Medications   Current Outpatient Rx  Name Route Sig Dispense Refill  . ASPIRIN 81 MG PO TBEC Oral Take 81 mg by mouth daily.      Marland Kitchen PRODIGY AUTOCODE BLOOD GLUCOSE W/DEVICE KIT Does not apply by Does not apply route 3 (three) times daily.      Marland Kitchen CALCITRIOL 0.25 MCG PO CAPS Oral Take 0.25 mcg by mouth daily.      Marland Kitchen CLOPIDOGREL BISULFATE 75 MG PO TABS Oral Take 75 mg by mouth daily.      Marland Kitchen  FUROSEMIDE 80 MG PO TABS Oral Take 160 mg by mouth 2 (two) times daily.    Marland Kitchen GABAPENTIN 100 MG PO TABS Oral Take 100 mg by mouth 3 (three) times daily.      Marland Kitchen GLUCOSE BLOOD VI KIT In Vitro by In Vitro route.      Marland Kitchen GLUCOSE BLOOD VI STRP Other 1 each by Other route 3 (three) times daily. Use as instructed     . HYDRALAZINE HCL 10 MG PO TABS Oral Take 10 mg by mouth 3 (three) times daily.    . INSULIN ASPART 100 UNIT/ML Lane SOLN Subcutaneous Inject 4-12 Units into the skin 3 (three) times daily before meals. Sliding scale      . INSULIN GLARGINE 100 UNIT/ML [place] SOLN Subcutaneous Inject 25-30 Units into the skin 2 (two) times daily. Inject 30 units in the am and 25 units in the pm daily      . ISOSORBIDE MONONITRATE ER 30 MG PO TB24 Oral Take 30 mg by mouth daily.    Marland Kitchen LANCETS MISC Does not apply by Does not apply route 3 (three) times daily.      Marland Kitchen METOLAZONE 2.5 MG PO TABS Oral Take 2.5 mg by mouth See admin instructions. Take 1 tablet daily on Mon, Wed, Fri (take 30 mins before AM dose of Lasix)    . METOPROLOL SUCCINATE ER 25 MG PO TB24 Oral Take 25 mg by mouth daily.    Marland Kitchen PANTOPRAZOLE SODIUM 40 MG PO TBEC Oral Take 40 mg by mouth daily.       BP 145/66  Pulse 83  Temp(Src) 98.6 F (37 C) (Oral)  Resp 16  Ht 5\' 4"  (1.626 m)  Wt 180 lb (81.647 kg)  BMI 30.90 kg/m2  SpO2 93%  Physical Exam  Nursing note and vitals reviewed. Constitutional: She is oriented to person, place, and time. She appears well-developed and well-nourished. No distress.  HENT:  Head:  Normocephalic and atraumatic.  Eyes: Conjunctivae are normal.  Neck: Normal range of motion. Neck supple.  Cardiovascular: Normal rate, regular rhythm, normal heart sounds and intact distal pulses.  Exam reveals no gallop and no friction rub.   No murmur heard. Pulmonary/Chest: Effort normal and breath sounds normal. No respiratory distress. She has no wheezes. She has no rales. She exhibits no tenderness.  Abdominal: Soft. Bowel sounds are normal. She exhibits no distension and no mass. There is no tenderness. There is no rebound and no guarding.  Musculoskeletal: Normal range of motion. She exhibits edema. She exhibits no tenderness.       Trace edema of bilateral LEs.   Neurological: She is alert and oriented to person, place, and time.  Skin: Skin is warm and dry. No rash noted. She is not diaphoretic. No erythema.  Psychiatric: She has a normal mood and affect.    ED Course  Procedures (including critical care time)  Neb albuterol/atrovent.   Date: 12/05/2011  Rate: 83  Rhythm: normal sinus rhythm  QRS Axis: normal  Intervals: normal  ST/T Wave abnormalities: nonspecific T wave changes  Conduction Disutrbances:first-degree A-V block   Narrative Interpretation:   Old EKG Reviewed: non provocative compared to Nov 16, 2011 though flipped T waves laterally     Labs Reviewed  CBC - Abnormal; Notable for the following:    RBC 3.00 (*)    Hemoglobin 9.1 (*)    HCT 28.2 (*)    RDW 16.7 (*)    All other components within normal limits  DIFFERENTIAL  PRO B NATRIURETIC PEPTIDE  BASIC METABOLIC PANEL   Dg Chest 2 View  12/05/2011  *RADIOLOGY REPORT*  Clinical Data:  Upper respiratory tract infection  CHEST - 2 VIEW  Comparison: 11/15/2011  Findings:   Heart size is normal.  No pleural effusion or pulmonary edema.  Bilateral, basilar predominant increased interstitial markings appear unchanged from previous exam.  No new findings.  IMPRESSION:  1.  No change in prominent  interstitial markings as before. 2.  No superimposed airspace consolidation.  Original Report Authenticated By: Rosealee Albee, M.D.     1. Congestive heart failure   2. Renal insufficiency   3. Shortness of breath       MDM  [facility] to see patient in ER for concern of ongoing CHF with difficulty managing with diuresis given renal insufficiency.         [place], [place] 12/05/11 1249

## 2011-12-05 NOTE — Consult Note (Signed)
[name] is an 66 y.o. female referred by Dr Daleen Squibb   Chief Complaint: nasal congestion, decreased appetite and orthopnea since friday HPI: 66yo wf with hx of CKD 5 C/O cough, congestion in nose and SOB when lies flat.  Appetite has been variable for some time and energy level decreased for months.  Had AVF placed on 11/21/11.  Baseline Scr around 4 and now 4.6.  Past Medical History  Diagnosis Date  . IDDM (insulin dependent diabetes mellitus)   . CAD (coronary artery disease)     a. 08/2005 - NSTEMI - Cath: Severe diffuse 3vd w/ NL EF - poor revasc candidate.;  b. NSTEMI 10/2011 treated medically   . Neuropathy   . Ulcer of foot     a. R foot - followed by Dr. Arbie Cookey & [facility]  . Hypertension   . DJD (degenerative joint disease)   . Cataracts, bilateral 07/15/10  . Hyperlipidemia   . CKD (chronic kidney disease), stage V      s/p AV graft '09 that occluded. s/p RUE AV fistula 11/21/11  in anticipation of HD  . GERD (gastroesophageal reflux disease)   . Hiatal hernia   . Heart murmur   . Carotid artery occlusion     a. s/p left CEA 06/2007;  b. 07/2011 u/s - RICA 1-39%, LICA patent  . CHF (congestive heart failure)     Mixed, Echo 11/16/11 Hypokinesis mid/apical inferior and mid/apical inferoseptal, mild LVH, EF 45-50%, high LV filling pressure, mildly dilated RA . Felt due to worsening renal function. Not on ACEI/ARB secondary to renal insufficiency  . History of stroke   . Iron deficiency anemia     Hgb ~9 in February 2013  . History of retinal detachment     a. right eye - repair 11/2007  . History of osteomyelitis     a. Right foot  . PVD (peripheral vascular disease)     a. ABI's 06/2011: R-37, L - 84     Past Surgical History  Procedure Date  . Cataract extraction   . Arteriovenous graft placement 2009  . Carotid endarterectomy 2008  . Av fistula placement 11/21/2011    Procedure: ARTERIOVENOUS (AV) FISTULA CREATION;  Surgeon: Chuck Hint, MD;   Location: [facility] OR;  Service: Vascular;  Laterality: N/A;  Right basilic vein transposition    Family History  Problem Relation Age of Onset  . Hypertension Other   . Stroke Other   . Heart attack Other   . Cancer Other   . Heart disease Other   . Diabetes Mother   . Cancer Mother     lung - died 55  . Cirrhosis Father     died 41  . Cancer Sister     liver  . Cancer Son     lymphatic   Social History:  reports that she has quit smoking. Her smoking use included Cigarettes. She does not have any smokeless tobacco history on file. She reports that she does not drink alcohol or use illicit drugs.  Allergies:  Allergies  Allergen Reactions  . Codeine Other (See Comments)    unknown    Medications Prior to Admission  Medication Dose Route Frequency Provider Last Rate Last Dose  . albuterol (PROVENTIL) (5 MG/ML) 0.5% nebulizer solution 5 mg  5 mg Nebulization Once [employer], PA   5 mg at 12/05/11 1220  . insulin aspart (novoLOG) injection 12 Units  12 Units Subcutaneous Once [employer]  [place], [place]   12 Units at 12/05/11 1307  . ipratropium (ATROVENT) nebulizer solution 0.5 mg  0.5 mg Nebulization Once [employer]on [employer] [place], PA   0.5 mg at 12/05/11 1220  . predniSONE (DELTASONE) tablet 60 mg  60 mg Oral Once [employer], [employer]   60 mg at 12/05/11 1218   Medications Prior to Admission  Medication Sig Dispense Refill  . aspirin (ASPIRIN LOW DOSE) 81 MG EC tablet Take 81 mg by mouth daily.        . Blood Glucose Monitoring Suppl (PRODIGY AUTOCODE BLOOD GLUCOSE) W/DEVICE KIT by Does not apply route 3 (three) times daily.        . calcitRIOL (ROCALTROL) 0.25 MCG capsule Take 0.25 mcg by mouth daily.        . clopidogrel (PLAVIX) 75 MG tablet Take 75 mg by mouth daily.        . furosemide (LASIX) 80 MG tablet Take 160 mg by mouth 2 (two) times daily.      Marland Kitchen gabapentin (NEURONTIN) 100 MG tablet Take 100 mg by mouth 3 (three) times daily.        . Glucose Blood KIT by In Vitro route.        Marland Kitchen  glucose blood test strip 1 each by Other route 3 (three) times daily. Use as instructed       . hydrALAZINE (APRESOLINE) 10 MG tablet Take 10 mg by mouth 3 (three) times daily.      . insulin aspart (NOVOLOG) 100 UNIT/ML injection Inject 4-12 Units into the skin 3 (three) times daily before meals. Sliding scale        . insulin glargine (LANTUS) 100 UNIT/ML injection Inject 25-30 Units into the skin 2 (two) times daily. Inject 30 units in the am and 25 units in the pm daily        . isosorbide mononitrate (IMDUR) 30 MG 24 hr tablet Take 30 mg by mouth daily.      . Lancets MISC by Does not apply route 3 (three) times daily.        . metolazone (ZAROXOLYN) 2.5 MG tablet Take 2.5 mg by mouth See admin instructions. Take 1 tablet daily on Mon, Wed, Fri (take 30 mins before AM dose of Lasix)      . metoprolol succinate (TOPROL-XL) 25 MG 24 hr tablet Take 25 mg by mouth daily.      . pantoprazole (PROTONIX) 40 MG tablet Take 40 mg by mouth daily.          Lab Results: UA: ND  Basename 12/05/11 1108  WBC 7.8  HGB 9.1*  HCT 28.2*  PLT 168   BMET  Basename 12/05/11 1108  NA 128*  K 3.7  CL 87*  CO2 19  GLUCOSE 418*  BUN 75*  CREATININE 4.65*  CALCIUM 9.6  PHOS --   LFT No results found for this basename: PROT,ALBUMIN,AST,ALT,ALKPHOS,BILITOT,BILIDIR,IBILI in the last 72 hours Dg Chest 2 View  12/05/2011  *RADIOLOGY REPORT*  Clinical Data:  Upper respiratory tract infection  CHEST - 2 VIEW  Comparison: 11/15/2011  Findings:   Heart size is normal.  No pleural effusion or pulmonary edema.  Bilateral, basilar predominant increased interstitial markings appear unchanged from previous exam.  No new findings.  IMPRESSION:  1.  No change in prominent interstitial markings as before. 2.  No superimposed airspace consolidation.  Original Report Authenticated By: Rosealee Albee, M.D.    ROS: No change in vision No CP No  change in bowels Edema improved No new neuropathic sxs  PHYSICAL  EXAM: Blood pressure 103/67, pulse 88, temperature 99.6 F (37.6 C), temperature source Oral, resp. rate 19, height 5\' 4"  (1.626 m), weight 81.647 kg (180 lb), SpO2 99.00%. HEENT: PERRLA EOMI NECK:no JVD LUNGS:rare rhonchi CARDIAC:RRR no rub ABD:+BS NTND no HSM EXT:no sig edema.  Rt UA AVF + bruit NEURO:CNI  OX2  + asterixis  Assessment: 1. CKD 5 with some uremic sxs 2. Hyperglycemia 3. Anemia on outpt procrit 4. Sec HPTH on calcitriol PLAN: 1. DC neurontin for now and see if confusion improves.  If not then will need catheter for HD 2. Recheck labs in AM 3. Check PTH   Tracey Stewart T 12/05/2011, 4:26 PM

## 2011-12-05 NOTE — Progress Notes (Signed)
D: Paged Nephrologis oncall and He stated that I needed to speak to Attending about Cbg's Will repage attending

## 2011-12-05 NOTE — H&P (Signed)
History and Physical  Patient ID: [name] MRN: [id number], SOB: 1946/02/16 66 y.o. Date of Encounter: 12/05/2011, 3:17 PM  Primary Physician: Rudi Heap, MD, MD Primary Cardiologist: Dr. Excell Seltzer  Chief Complaint: shortness of breath  HPI: 66 yo female with hx of stage V renal failure nearing dialysis, CAD, mixed CHF, DM II, and PVD who presents to [facility] with complaints of worsened SOB and cough X4 days. Pt presents with her husband who provides some details of the HPI.  Pt notes that SOB is persistent but waxes and wanes in severity. It is worse with laying flat, has been using a wedge pillow plus an extra pillow; improved with sitting up or standing up. She also notes increased DOE; at baseline she is able to do daily household chores with use of walker without SOB. She also endorses PND and increased swelling in lower extremities. She is not aware of any weight changes but does not weigh herself at home. She denies chest pain, no palpitations. She reports a recent URI for which she was seen yesterday and given cough syrup and an antibiotic which she cannot recall. She has been unable to sleep well secondary to cough which is non-productive and associated with head congestion. She denies sore throat, no fever, no chills. No sick contacts. She does endorse some diffuse lower abdominal pain and had some nausea and one episode of vomiting for which pt's husband believes is secondary to use of cough syrup. She has not had an appetite X2 days. She has also felt recent increase in fatigue. She has not had any chest pain.  Pt's husband notes decrease in urine output even with the initiation of Metolazone. The patient was seen on 11/28/11 at [facility] for post-hospital evaluation s/p medically treated NSTEMI where Metolazone was initiated in addition to Lasix regimen; see d/c note 11/22/11. Cr was 4.7 at that time on 11/28/11, is 4.65 today. She continues to see Dr. Kathrene Bongo for CKD management.  She denies numbness or tingling in her distal extremities. She has not noticed a change in her blood sugars which she checks 3 times per day and typically run between 200-240. ProBNP [id number]. CXR shows chronic interstitial changes, but no acute disease. CBG is 418. EKG is abnormal but without acute change from prior.  Past Medical History  Diagnosis Date  . IDDM (insulin dependent diabetes mellitus)   . CAD (coronary artery disease)     a. 08/2005 - NSTEMI - Cath: Severe diffuse 3vd w/ NL EF - poor revasc candidate.;  b. NSTEMI 10/2011 treated medically   . Neuropathy   . Ulcer of foot     a. R foot - followed by Dr. Arbie Cookey & [facility]  . Hypertension   . DJD (degenerative joint disease)   . Cataracts, bilateral 07/15/10  . Hyperlipidemia   . CKD (chronic kidney disease), stage V      s/p AV graft '09 that occluded. s/p RUE AV fistula 11/21/11  in anticipation of HD  . GERD (gastroesophageal reflux disease)   . Hiatal hernia   . Heart murmur   . Carotid artery occlusion     a. s/p left CEA 06/2007;  b. 07/2011 u/s - RICA 1-39%, LICA patent  . CHF (congestive heart failure)     Mixed, Echo 11/16/11 Hypokinesis mid/apical inferior and mid/apical inferoseptal, mild LVH, EF 45-50%, high LV filling pressure, mildly dilated RA . Felt due to worsening renal function. Not on ACEI/ARB secondary to renal  insufficiency  . History of stroke   . Iron deficiency anemia     Hgb ~9 in February 2013  . History of retinal detachment     a. right eye - repair 11/2007  . History of osteomyelitis     a. Right foot  . PVD (peripheral vascular disease)     a. ABI's 06/2011: R-37, L - 84      Surgical History:  Past Surgical History  Procedure Date  . Cataract extraction   . Arteriovenous graft placement 2009  . Carotid endarterectomy 2008  . Av fistula placement 11/21/2011    Procedure: ARTERIOVENOUS (AV) FISTULA CREATION;  Surgeon: Chuck Hint, MD;  Location: [facility] OR;  Service:  Vascular;  Laterality: N/A;  Right basilic vein transposition     Home Meds: Prior to Admission medications   Medication Sig Start Date End Date Taking? Authorizing Provider  aspirin (ASPIRIN LOW DOSE) 81 MG EC tablet Take 81 mg by mouth daily.     Yes Historical Provider, MD  Blood Glucose Monitoring Suppl (PRODIGY AUTOCODE BLOOD GLUCOSE) W/DEVICE KIT by Does not apply route 3 (three) times daily.     Yes Historical Provider, MD  calcitRIOL (ROCALTROL) 0.25 MCG capsule Take 0.25 mcg by mouth daily.     Yes Historical Provider, MD  clopidogrel (PLAVIX) 75 MG tablet Take 75 mg by mouth daily.     Yes Historical Provider, MD  furosemide (LASIX) 80 MG tablet Take 160 mg by mouth 2 (two) times daily. 11/22/11 11/21/12 Yes Jessica A Hope, PA-C  gabapentin (NEURONTIN) 100 MG tablet Take 100 mg by mouth 3 (three) times daily.     Yes Historical Provider, MD  Glucose Blood KIT by In Vitro route.     Yes Historical Provider, MD  glucose blood test strip 1 each by Other route 3 (three) times daily. Use as instructed    Yes Historical Provider, MD  hydrALAZINE (APRESOLINE) 10 MG tablet Take 10 mg by mouth 3 (three) times daily. 11/22/11  Yes Jessica A Hope, PA-C  insulin aspart (NOVOLOG) 100 UNIT/ML injection Inject 4-12 Units into the skin 3 (three) times daily before meals. Sliding scale     Yes Historical Provider, MD  insulin glargine (LANTUS) 100 UNIT/ML injection Inject 25-30 Units into the skin 2 (two) times daily. Inject 30 units in the am and 25 units in the pm daily     Yes Historical Provider, MD  isosorbide mononitrate (IMDUR) 30 MG 24 hr tablet Take 30 mg by mouth daily. 11/22/11 11/21/12 Yes Jessica A Hope, PA-C  Lancets MISC by Does not apply route 3 (three) times daily.     Yes Historical Provider, MD  metolazone (ZAROXOLYN) 2.5 MG tablet Take 2.5 mg by mouth See admin instructions. Take 1 tablet daily on Mon, Wed, Fri (take 30 mins before AM dose of Lasix) 11/28/11  Yes Beatrice Lecher, PA    metoprolol succinate (TOPROL-XL) 25 MG 24 hr tablet Take 25 mg by mouth daily. 11/22/11 11/21/12 Yes Jessica A Hope, PA-C  pantoprazole (PROTONIX) 40 MG tablet Take 40 mg by mouth daily.    Yes Historical Provider, MD    Allergies:  Allergies  Allergen Reactions  . Codeine Other (See Comments)    unknown    History   Social History  . Marital Status: Widowed    Spouse Name: N/A    Number of Children: N/A  . Years of Education: N/A   Occupational History  . Not on file.  Social History Main Topics  . Smoking status: Former Smoker    Types: Cigarettes  . Smokeless tobacco: Not on file   Comment: smoked a pack/week for about 20 yrs.  Quit about 25 y ago  . Alcohol Use: No     rare beer  . Drug Use: No  . Sexually Active: Not on file   Other Topics Concern  . Not on file   Social History Narrative   Lives at home with husband in [place].  She is retired; used to work as a Lawyer. Has 2 sons and 1 daughter, all healthy.     Family History  Problem Relation Age of Onset  . Hypertension Other   . Stroke Other   . Heart attack Other   . Cancer Other   . Heart disease Other   . Diabetes Mother   . Cancer Mother     lung - died 13  . Cirrhosis Father     died 17  . Cancer Sister     liver  . Cancer Son     lymphatic    Review of Systems: General: negative for chills, fever, night sweats or weight changes, +++fatigue  Cardiovascular: negative for chest pain or palpitations, +++ edema, +++ orthopnea, +++ paroxysmal nocturnal dyspnea, +++ shortness of breath, +++ dyspnea on exertion Dermatological: negative for rash Respiratory: +++ cough, no wheezing Urologic: negative for hematuria Abdominal: +++ nausea, 1 episode vomiting, no diarrhea, bright red blood per rectum, melena, or hematemesis Neurologic: negative for visual changes, syncope, or dizziness All other systems reviewed and are otherwise negative except as noted above.  Labs:   Lab Results  Component  Value Date   WBC 7.8 12/05/2011   HGB 9.1* 12/05/2011   HCT 28.2* 12/05/2011   MCV 94.0 12/05/2011   PLT 168 12/05/2011     Lab 12/05/11 1108  NA 128*  K 3.7  CL 87*  CO2 19  BUN 75*  CREATININE 4.65*  CALCIUM 9.6  PROT --  BILITOT --  ALKPHOS --  ALT --  AST --  GLUCOSE 418*   Lab Results  Component Value Date   CHOL 113 11/16/2011   HDL 21* 11/16/2011   LDLCALC 72 11/16/2011   TRIG 98 11/16/2011   Radiology/Studies:  1. Chest 2 View 12/05/2011  *RADIOLOGY REPORT*  Clinical Data:  Upper respiratory tract infection  CHEST - 2 VIEW  Comparison: 11/15/2011  Findings:   Heart size is normal.  No pleural effusion or pulmonary edema.  Bilateral, basilar predominant increased interstitial markings appear unchanged from previous exam.  No new findings.  IMPRESSION:  1.  No change in prominent interstitial markings as before. 2.  No superimposed airspace consolidation.  Original Report Authenticated By: Rosealee Albee, M.D.    EKG: NSR 1st degree AVB 81bpm poor R wave progression V3, ST depression/TWI I, avL, V5-V6   Physical Exam: Blood pressure 103/67, pulse 88, temperature 99.6 F (37.6 C), temperature source Oral, resp. rate 19, height 5\' 4"  (1.626 m), weight 180 lb (81.647 kg), SpO2 99.00%. General: Well developed middle-aged WF with sallow complexion in mild distress. Head: Normocephalic, atraumatic, sclera non-icteric, no xanthomas, nares are without discharge.  Neck: Negative for carotid bruits. + mild JVD. Lungs: Limited air flow, mild bilateral wheezing but no rhonchi, rales. Breathing is slightly labored. Heart: RRR with S1 S2. No murmurs, rubs, or gallops appreciated. Abdomen: Soft, obese, diffuse mild lower quadrant tenderness, non-distended with normoactive bowel sounds. No hepatomegaly. No rebound/guarding. No  obvious abdominal masses. Msk:  Strength and tone appear normal for age. Extremities: No clubbing or cyanosis. Tr edema up to mid calf.  Distal pedal pulses are 1+  and equal bilaterally. Neuro: Alert and oriented to person and place. States year as 2012, states Reagan as president, able to provide names of family dogs. Moves all extremities spontaneously. Psych:  Responds to questions appropriately with some encouragement. Somewhat lethargic, slow to respond.    ASSESSMENT AND PLAN:   1. Dyspnea felt multifactorial at this time. She may have a component of volume overload as evidenced by proBNP elevation, (although this lab is not always reliable in renal failure). CXR does not show any acute disease but she may require further diuresis so we have contacted Dr. Briant Cedar with the nephrology service for assistance with management. Question if there is a degree of acidosis contributing to her dyspnea. Will defer Lasix/metolazone dosing to their team, appreciate assistance. From the standpoint of acute on chronic mixed CHF, will continue BB, Imdur/hydralazine (no ACEI/ARB secondary to renal insufficiency). Follow strict I&Os and daily weights.  2. CKD stage V, nearing dialysis - BUN is quite elevated; it may be possible that some of her general malaise complaints are related to uremia. Cr 4.65 today (3.98 on discharge 10/2011, 4.7 as OP on 11/28/11), appreciate renal assistance.   3. 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 Anion gap is 22 so wonder if acidosis is driving dyspnea. Will check UA. Dr. Daleen Squibb has discussed this with Dr. Briant Cedar who will assist [place] in treatment of her hyperglycemia.  4. Underlying coronary artery disease - recent NSTEMI treated medically. EKG is abnormal but nonacute. Continue ASA, BB, Plavix. Will not follow cardiac enzymes as she has multiple reasons why these would be elevated regardless of ischemic event - since this would not change management at this time given underlying comorbidities, will continue to treat medically.  Signed, Dayna Dunn PA-C 12/05/2011, 3:17 PM  I have taken a  history, reviewed medications, allergies, PMH, SH, FH, and reviewed ROS and examined the patient.  I agree with the assessment and plan as discussed and outlined by Dunn PA-C. She has significant metabolic issues and needs Nephrology input. She is close to dialysis. Her restlessness and confusion most likely secondary to metabolic acidosis from renal insufficiency.  Eila Runyan C. Daleen Squibb, MD, [facility] Pager:  [phone]

## 2011-12-05 NOTE — ED Provider Notes (Signed)
Patient has history of coronary artery disease with a recent non-STEMI. She also has renal failure and recently had a fistula placed in her right arm in anticipation of starting dialysis. She has been having congestive heart failure. She relates shortness of breath but has URI type symptoms with nasal congestion and cough. She has mild swelling of her feet.  Patient is alert and cooperative. Her skin color is of hand consistent with history of renal disease. Her lungs show diminished breath sounds diffusely with a few rales at bases. She appears to At rest and is having frequent coughing and at times feels like she's having mild bronchospasm when she coughs. She has mild swelling around her ankles and feet.  Medical screening examination/treatment/procedure(s) were conducted as a shared visit with non-physician practitioner(s) and myself.  I personally evaluated the patient during the encounter Devoria Albe, MD, Franz Dell, MD 12/05/11 1255

## 2011-12-05 NOTE — Progress Notes (Signed)
D: Pt's Cbg 475, Paged Attending and e with PA for cardiology and stated that the nephrologist would managing Pt's blood sugars.m Will page Oncall Nephrologist.

## 2011-12-05 NOTE — ED Provider Notes (Signed)
See prior note   Ward Givens, MD 12/05/11 1317

## 2011-12-06 ENCOUNTER — Encounter (HOSPITAL_COMMUNITY): Payer: Self-pay | Admitting: Neurology

## 2011-12-06 DIAGNOSIS — I5042 Chronic combined systolic (congestive) and diastolic (congestive) heart failure: Secondary | ICD-10-CM

## 2011-12-06 LAB — GLUCOSE, CAPILLARY
Glucose-Capillary: 362 mg/dL — ABNORMAL HIGH (ref 70–99)
Glucose-Capillary: 332 mg/dL — ABNORMAL HIGH (ref 70–99)
Glucose-Capillary: 139 mg/dL — ABNORMAL HIGH (ref 70–99)
Glucose-Capillary: 144 mg/dL — ABNORMAL HIGH (ref 70–99)

## 2011-12-06 LAB — BASIC METABOLIC PANEL
Calcium: 9.5 mg/dL (ref 8.4–10.5)
Calcium: 9.6 mg/dL (ref 8.4–10.5)
Calcium: 9.4 mg/dL (ref 8.4–10.5)
Chloride: 89 mEq/L — ABNORMAL LOW (ref 96–112)
Creatinine, Ser: 4.75 mg/dL — ABNORMAL HIGH (ref 0.50–1.10)
GFR calc Af Amer: 10 mL/min — ABNORMAL LOW (ref >90)
GFR calc Af Amer: 10 mL/min — ABNORMAL LOW (ref >90)
GFR calc Af Amer: 10 mL/min — ABNORMAL LOW (ref >90)
GFR calc non Af Amer: 9 mL/min — ABNORMAL LOW (ref >90)
GFR calc non Af Amer: 8 mL/min — ABNORMAL LOW (ref >90)
GFR calc non Af Amer: 9 mL/min — ABNORMAL LOW (ref >90)
Potassium: 3.9 mEq/L (ref 3.5–5.1)
Sodium: 130 mEq/L — ABNORMAL LOW (ref 135–145)
Sodium: 130 mEq/L — ABNORMAL LOW (ref 135–145)

## 2011-12-06 LAB — CBC
Hemoglobin: 9.2 g/dL — ABNORMAL LOW (ref 12.0–15.0)
MCHC: 31.7 g/dL (ref 30.0–36.0)
RBC: 3.07 MIL/uL — ABNORMAL LOW (ref 3.87–5.11)

## 2011-12-06 LAB — COMPREHENSIVE METABOLIC PANEL
ALT: 137 U/L — ABNORMAL HIGH (ref 0–35)
CO2: 24 mEq/L (ref 19–32)
Calcium: 9.6 mg/dL (ref 8.4–10.5)
Creatinine, Ser: 4.93 mg/dL — ABNORMAL HIGH (ref 0.50–1.10)
GFR calc Af Amer: 10 mL/min — ABNORMAL LOW (ref >90)
GFR calc non Af Amer: 8 mL/min — ABNORMAL LOW (ref >90)
Glucose, Bld: 337 mg/dL — ABNORMAL HIGH (ref 70–99)

## 2011-12-06 LAB — PHOSPHORUS: Phosphorus: 6.6 mg/dL — ABNORMAL HIGH (ref 2.3–4.6)

## 2011-12-06 LAB — HEPATITIS B SURFACE ANTIGEN: Hepatitis B Surface Ag: NEGATIVE

## 2011-12-06 MED ORDER — PNEUMOCOCCAL VAC POLYVALENT 25 MCG/0.5ML IJ INJ
0.5000 mL | INJECTION | INTRAMUSCULAR | Status: AC
  Administered 2011-12-07: 0.5 mL via INTRAMUSCULAR
  Filled 2011-12-06: qty 0.5

## 2011-12-06 MED ORDER — INSULIN ASPART 100 UNIT/ML ~~LOC~~ SOLN
0.0000 [IU] | Freq: Every day | SUBCUTANEOUS | Status: DC
  Administered 2011-12-07: 2 [IU] via SUBCUTANEOUS
  Administered 2011-12-09: 5 [IU] via SUBCUTANEOUS

## 2011-12-06 MED ORDER — INSULIN ASPART 100 UNIT/ML ~~LOC~~ SOLN
5.0000 [IU] | Freq: Once | SUBCUTANEOUS | Status: AC
  Administered 2011-12-06: 5 [IU] via SUBCUTANEOUS

## 2011-12-06 MED ORDER — INSULIN GLARGINE 100 UNIT/ML ~~LOC~~ SOLN
10.0000 [IU] | Freq: Every day | SUBCUTANEOUS | Status: DC
  Administered 2011-12-06: 10 [IU] via SUBCUTANEOUS

## 2011-12-06 MED ORDER — INSULIN ASPART 100 UNIT/ML ~~LOC~~ SOLN
0.0000 [IU] | Freq: Three times a day (TID) | SUBCUTANEOUS | Status: DC
  Administered 2011-12-06 – 2011-12-08 (×3): 3 [IU] via SUBCUTANEOUS
  Administered 2011-12-09 (×2): 4 [IU] via SUBCUTANEOUS
  Administered 2011-12-09: 3 [IU] via SUBCUTANEOUS
  Administered 2011-12-10 (×2): 4 [IU] via SUBCUTANEOUS

## 2011-12-06 MED ORDER — INSULIN GLARGINE 100 UNIT/ML ~~LOC~~ SOLN
30.0000 [IU] | Freq: Every day | SUBCUTANEOUS | Status: DC
  Administered 2011-12-09: 30 [IU] via SUBCUTANEOUS

## 2011-12-06 MED ORDER — INSULIN GLARGINE 100 UNIT/ML ~~LOC~~ SOLN
25.0000 [IU] | Freq: Every evening | SUBCUTANEOUS | Status: DC
  Administered 2011-12-06 – 2011-12-09 (×3): 25 [IU] via SUBCUTANEOUS

## 2011-12-06 MED ORDER — INSULIN ASPART 100 UNIT/ML ~~LOC~~ SOLN: 4.0000 [IU] | Freq: Three times a day (TID) | SUBCUTANEOUS | Status: DC

## 2011-12-06 MED ORDER — INSULIN GLARGINE 100 UNIT/ML ~~LOC~~ SOLN: 25.0000 [IU] | Freq: Two times a day (BID) | SUBCUTANEOUS | Status: DC

## 2011-12-06 MED ORDER — INSULIN ASPART 100 UNIT/ML ~~LOC~~ SOLN
5.0000 [IU] | Freq: Three times a day (TID) | SUBCUTANEOUS | Status: DC
  Administered 2011-12-06 – 2011-12-11 (×10): 5 [IU] via SUBCUTANEOUS

## 2011-12-06 NOTE — Progress Notes (Signed)
Patient Name: [name] Date of Encounter: 12/06/2011     Principal Problem:  *Chronic combined systolic and diastolic heart failure Active Problems:  DIABETES MELLITUS, TYPE II  HYPERLIPIDEMIA  PERIPHERAL NEUROPATHY  HYPERTENSION, UNSPECIFIED  CAD  PERIPHERAL VASCULAR DISEASE  GERD  Chronic kidney disease (CKD), stage V    SUBJECTIVE  Pt notes marked improvement in general feeling and SOB from yesterday; she continues to have trouble with lying flat. Denies chest pain. Continued feeling of head congestion with cough which remains unproductive. Pt OOB with assistance, denies dizziness with standing. Husband notes that pt is acting much more like herself today.  CURRENT MEDS    . albuterol  5 mg Nebulization Once  . aspirin EC  81 mg Oral Daily  . calcitRIOL  0.25 mcg Oral Daily  . clopidogrel  75 mg Oral Daily  . heparin  5,000 Units Subcutaneous Q8H  . hydrALAZINE  10 mg Oral TID  . insulin aspart  0-9 Units Subcutaneous TID WC  . insulin glargine  10 Units Subcutaneous QHS  . ipratropium  0.5 mg Nebulization Once  . isosorbide mononitrate  30 mg Oral Daily  . metoprolol succinate  25 mg Oral Daily  . pantoprazole  40 mg Oral Daily  . predniSONE  60 mg Oral Once  . sodium chloride  3 mL Intravenous Q12H    OBJECTIVE  Filed Vitals:   12/06/11 0308 12/06/11 0500 12/06/11 0800 12/06/11 0908  BP: 122/72  126/64 122/87  Pulse: 69  73 72  Temp: 97.6 F (36.4 C)  97.1 F (36.2 C)   TempSrc: Oral  Oral   Resp: 35  27   Height:      Weight:  172 lb 6.4 oz (78.2 kg)    SpO2: 100%  99%    No intake or output data in the 24 hours ending 12/06/11 1143 Filed Weights   12/05/11 1058 12/05/11 1930 12/06/11 0500  Weight: 180 lb (81.647 kg) 172 lb 13.5 oz (78.4 kg) 172 lb 6.4 oz (78.2 kg)    PHYSICAL EXAM  General: Pleasant, NAD. Neuro: Alert and oriented X 3. Moves all extremities spontaneously. Psych: Normal affect. HEENT:  Normal  Neck: Supple without  bruits/  JVP approx 7cm. Lungs:  Resp regular and unlabored, CTA. Heart: RRR no s3, s4, 2/6 systolic murmur heard best at upper LSB. Abdomen: Soft, non-tender, non-distended, BS + x 4.  Extremities: No clubbing or cyanosis, bilateral 1+ edema up to mid calf. DP/PT/Radials 1+ and equal bilaterally.  Accessory Clinical Findings  CBC  Basename 12/06/11 0640 12/05/11 1943 12/05/11 1108  WBC 7.0 7.5 --  NEUTROABS -- -- 6.9  HGB 9.2* 8.9* --  HCT 29.0* 27.9* --  MCV 94.5 93.3 --  PLT 142* 153 --   Basic Metabolic Panel  Basename 12/06/11 0640 12/06/11 0515  NA 131* 130*  K 3.8 3.9  CL 89* 89*  CO2 24 18*  GLUCOSE 337* 336*  BUN 88* 87*  CREATININE 4.93* 4.80*  CALCIUM 9.6 9.4  MG -- --  PHOS 6.6* --   Liver Function Tests  [facility] 12/06/11 0640 12/05/11 1943  AST 225* 193*  ALT 137* 97*  ALKPHOS 84 89  BILITOT 0.8 1.4*  PROT 6.9 7.1  ALBUMIN 3.8 4.0   TELE  NSR, rate 71; rare pvc.  ECG  NSR, rate 74; 1st degree AV block; QTc 486; ST depression/T wave inversion I, aVL, V5/V6  Radiology/Studies  Dg Chest 2 View  12/05/2011  *  RADIOLOGY REPORT*  Clinical Data:  Upper respiratory tract infection  CHEST - 2 VIEW  Comparison: 11/15/2011  Findings:   Heart size is normal.  No pleural effusion or pulmonary edema.  Bilateral, basilar predominant increased interstitial markings appear unchanged from previous exam.  No new findings.  IMPRESSION:  1.  No change in prominent interstitial markings as before. 2.  No superimposed airspace consolidation.  Original Report Authenticated By: Rosealee Albee, M.D.    ASSESSMENT AND PLAN  1. Acute on chronic CHF mixed:  Improvement in dyspnea.  Weight of 172 is down 5 lbs from wt upon discharge in Feb.  JVP relatively flat and lung exam is nl.  Not clear that this is driving force behind Ss.  ? URI with low pulm reserve resulting in more severe dyspnea.  No chest pain.  Diuretics on hold - will defer to nephrology (home regimen  includes 160 mg Lasix bid with Metolazone 2.5 M/W/F).  Will continue BB, Imdur, Hydralazine; no ACE/ARB given renal insufficiency. Follow strict I/Os and daily weights.   Prob ok to go to floor.   2. CAD:  Recent NSTEMI treated medically with ASA, BB, Plavix; continue regimen.  No chest pain.  3. CKD stage V:  Creatinine continues to increase from 4.65 on admission to 4.93 this AM. Confusion much improved from yesterday's interview and exam with holding of neurontin.  Appreciate Nephrology assistance.   4. Type II DM, uncontrolled with nephropathy:  Sugars starting to trend down with initiation of Lantus and Novolog, monitoring BMet q2 hours due to anion gap and possibility of DKA component; CO2 wnl after lab re-draw. Internal Medicine assisting with management, appreciate recommendations.    Signed, Nicolasa Ducking NP As above; patient states dyspnea has improved. Patient not volume overloaded on exam; diuretics on hold. Follow renal function; nephrology adjusting diuretics. She is nearing dialysis. Olga Millers

## 2011-12-06 NOTE — Progress Notes (Addendum)
[facility] - CONSULT F/U NOTE [place] TEAM 1 - Stepdown/ICU TEAM  Subjective: 66 yo female with hx of stage V renal failure nearing dialysis, CAD, mixed CHF, DM II, and PVD who presents to [facility] with complaints of worsened SOB and cough X4 days.  [facility] has been asked to follow as a consulting service to provide DM management.  This morning the pt is sitting at the side of her bed resting comfortably.  She denies f/c, cp, n/v, abdom pain, or ha.  She reports that her SOB has much improved.  Objective: Weight change:  No intake or output data in the 24 hours ending 12/06/11 1110 Blood pressure 122/87, pulse 72, temperature 97.1 F (36.2 C), temperature source Oral, resp. rate 27, height 5\' 6"  (1.676 m), weight 78.2 kg (172 lb 6.4 oz), SpO2 99.00%.  Physical Exam: General: No acute respiratory distress at rest Lungs: Clear to auscultation bilaterally with only minor bibasilar crackles Cardiovascular: Regular rate without murmur gallop or rub normal S1 and S2 Abdomen: Nontender, nondistended, soft, bowel sounds positive, no rebound, no ascites, no appreciable mass Extremities: No significant cyanosis, clubbing, or edema bilateral lower extremities  Lab Results:  [facility] 12/06/11 0640 12/06/11 0515 12/06/11 0332  NA 131* 130* 130*  K 3.8 3.9 3.7  CL 89* 89* 89*  CO2 24 18* 23  GLUCOSE 337* 336* 342*  BUN 88* 87* 87*  CREATININE 4.93* 4.80* 4.90*  CALCIUM 9.6 9.4 9.6  MG -- -- --  PHOS 6.6* -- --    Basename 12/06/11 0640 12/05/11 1943  AST 225* 193*  ALT 137* 97*  ALKPHOS 84 89  BILITOT 0.8 1.4*  PROT 6.9 7.1  ALBUMIN 3.8 4.0    Basename 12/06/11 0640 12/05/11 1943 12/05/11 1108  WBC 7.0 7.5 7.8  NEUTROABS -- -- 6.9  HGB 9.2* 8.9* 9.1*  HCT 29.0* 27.9* 28.2*  MCV 94.5 93.3 94.0  PLT 142* 153 168   Micro Results: Recent Results (from the past 240 hour(s))  MRSA PCR SCREENING     Status: Normal   Collection Time   12/05/11  7:12 PM      Component Value Range  Status Comment   MRSA by PCR NEGATIVE  NEGATIVE  Final     Studies/Results: All recent x-ray/radiology reports have been reviewed in detail.   Medications: I have reviewed the patient's complete medication list.  Reccomendations:  DM Very poorly controlled - no convincing evidence of severe DKA at present - gap acidosis most likely related to progressive renal disease - urine ketone assay ordered, but pt has not been able to produce a urine sample at this time - in absence of acute clinical decline, will simply adjust DM tx regimen to gain control of DM - no ACE/ARB due to renal failure - already on ASA  hyponatremia Likely a component of pseudohyponatremia due to hyperglycemia - tx and follow  Multifactorial dyspnea As per primary service   CKD Stage IV Nephrology is following  CAD w/ recent NSTEMI Cards is the primary service  Mixed systolic and diastolic CHF As per primary service  HTN As per primary service and Nephrology  Hyperlipidemia  GERD/HH  Chronic Fe deficiency anemia  Disposition At discretion of primary service - pt is medically appropriate for tele bed from DM standpoint - we will be happy to continue to follow along with you  Lonia Blood, MD [facility] Office  [phone] Pager [phone]  On-Call/Text Page:      [email or web address]  password [facility]

## 2011-12-06 NOTE — Consult Note (Signed)
Reason for Consult: Management of sugars/Diabetes Referring Physician: Armanda Magic, MD  [name] is an 66 y.o. female with a PMHX of DM II for 30 yrs, on insulin for 2 yrs. She has multiple comorbidities and is admitted by cardiology this visit for CHF. She also has Stage V CKD and has had a recent NSTEMI 11/28/11 with a hx of CAD.    HPI: [name] was admitted to hospital by cardiology for CHF decompensation. Her proBNP was elevated and she had a recent hx of symptoms associated with decompensation, including cough, SOB, and orthopnea. She has been a Diabetic x 30 yrs and insulin dependent for about 2 yrs. Upon admission, she reported decreased appetite for past few days and one episode of n/v. She denies diaphoresis, mental status changes, excessive thirst, or polyuria lately. She did endorse lower urinary output for past day or so. Nephrology has seen her here secondary to her CKD.  [facility] are asked to help manage her DM while in the hospital.   Past Medical History  Diagnosis Date  . IDDM (insulin dependent diabetes mellitus)   . CAD (coronary artery disease)     a. 08/2005 - NSTEMI - Cath: Severe diffuse 3vd w/ NL EF - poor revasc candidate.;  b. NSTEMI 10/2011 treated medically   . Neuropathy   . Ulcer of foot     a. R foot - followed by Dr. Arbie Cookey & [facility]  . Hypertension   . DJD (degenerative joint disease)   . Cataracts, bilateral 07/15/10  . Hyperlipidemia   . CKD (chronic kidney disease), stage V      s/p AV graft '09 that occluded. s/p RUE AV fistula 11/21/11  in anticipation of HD  . GERD (gastroesophageal reflux disease)   . Hiatal hernia   . Heart murmur   . Carotid artery occlusion     a. s/p left CEA 06/2007;  b. 07/2011 u/s - RICA 1-39%, LICA patent  . CHF (congestive heart failure)     Mixed, Echo 11/16/11 Hypokinesis mid/apical inferior and mid/apical inferoseptal, mild LVH, EF 45-50%, high LV filling pressure, mildly dilated RA . Felt due to  worsening renal function. Not on ACEI/ARB secondary to renal insufficiency  . History of stroke   . Iron deficiency anemia     Hgb ~9 in February 2013  . History of retinal detachment     a. right eye - repair 11/2007  . History of osteomyelitis     a. Right foot  . PVD (peripheral vascular disease)     a. ABI's 06/2011: R-37, L - 84     Past Surgical History  Procedure Date  . Cataract extraction   . Arteriovenous graft placement 2009  . Carotid endarterectomy 2008  . Av fistula placement 11/21/2011    Procedure: ARTERIOVENOUS (AV) FISTULA CREATION;  Surgeon: Chuck Hint, MD;  Location: [facility] OR;  Service: Vascular;  Laterality: N/A;  Right basilic vein transposition    Family History  Problem Relation Age of Onset  . Hypertension Other   . Stroke Other   . Heart attack Other   . Cancer Other   . Heart disease Other   . Diabetes Mother   . Cancer Mother     lung - died 24  . Cirrhosis Father     died 81  . Cancer Sister     liver  . Cancer Son     lymphatic    Social History:  reports that  she has quit smoking. Her smoking use included Cigarettes. She does not have any smokeless tobacco history on file. She reports that she does not drink alcohol or use illicit drugs.  Allergies:  Allergies  Allergen Reactions  . Codeine Other (See Comments)    unknown    Medications: I have reviewed the patient's medications.  Results for orders placed during the hospital encounter of 12/05/11 (from the past 48 hour(s))  PRO B NATRIURETIC PEPTIDE     Status: Abnormal   Collection Time   12/05/11 11:08 AM      Component Value Range Comment   Pro B Natriuretic peptide (BNP) 65608.0 (*) 0 - 125 (pg/mL)   CBC     Status: Abnormal   Collection Time   12/05/11 11:08 AM      Component Value Range Comment   WBC 7.8  4.0 - 10.5 (K/uL)    RBC 3.00 (*) 3.87 - 5.11 (MIL/uL)    Hemoglobin 9.1 (*) 12.0 - 15.0 (g/dL)    HCT 16.1 (*) 09.6 - 46.0 (%)    MCV 94.0  78.0 - 100.0 (fL)     MCH 30.3  26.0 - 34.0 (pg)    MCHC 32.3  30.0 - 36.0 (g/dL)    RDW 04.5 (*) 40.9 - 15.5 (%)    Platelets 168  150 - 400 (K/uL)   DIFFERENTIAL     Status: Abnormal   Collection Time   12/05/11 11:08 AM      Component Value Range Comment   Neutrophils Relative 88 (*) 43 - 77 (%)    Lymphocytes Relative 7 (*) 12 - 46 (%)    Monocytes Relative 4  3 - 12 (%)    Eosinophils Relative 0  0 - 5 (%)    Basophils Relative 1  0 - 1 (%)    Neutro Abs 6.9  1.7 - 7.7 (K/uL)    Lymphs Abs 0.5 (*) 0.7 - 4.0 (K/uL)    Monocytes Absolute 0.3  0.1 - 1.0 (K/uL)    Eosinophils Absolute 0.0  0.0 - 0.7 (K/uL)    Basophils Absolute 0.1  0.0 - 0.1 (K/uL)    Smear Review MORPHOLOGY UNREMARKABLE     BASIC METABOLIC PANEL     Status: Abnormal   Collection Time   12/05/11 11:08 AM      Component Value Range Comment   Sodium 128 (*) 135 - 145 (mEq/L)    Potassium 3.7  3.5 - 5.1 (mEq/L)    Chloride 87 (*) 96 - 112 (mEq/L)    CO2 19  19 - 32 (mEq/L)    Glucose, Bld 418 (*) 70 - 99 (mg/dL)    BUN 75 (*) 6 - 23 (mg/dL)    Creatinine, Ser 8.11 (*) 0.50 - 1.10 (mg/dL)    Calcium 9.6  8.4 - 10.5 (mg/dL)    GFR calc non Af Amer 9 (*) >90 (mL/min)    GFR calc Af Amer 10 (*) >90 (mL/min)   GLUCOSE, CAPILLARY     Status: Abnormal   Collection Time   12/05/11  6:49 PM      Component Value Range Comment   Glucose-Capillary 401 (*) 70 - 99 (mg/dL)   GLUCOSE, CAPILLARY     Status: Abnormal   Collection Time   12/05/11  7:09 PM      Component Value Range Comment   Glucose-Capillary 405 (*) 70 - 99 (mg/dL)   MRSA PCR SCREENING     Status: Normal  Collection Time   12/05/11  7:12 PM      Component Value Range Comment   MRSA by PCR NEGATIVE  NEGATIVE    CBC     Status: Abnormal   Collection Time   12/05/11  7:43 PM      Component Value Range Comment   WBC 7.5  4.0 - 10.5 (K/uL)    RBC 2.99 (*) 3.87 - 5.11 (MIL/uL)    Hemoglobin 8.9 (*) 12.0 - 15.0 (g/dL)    HCT 29.5 (*) 62.1 - 46.0 (%)    MCV 93.3  78.0 - 100.0  (fL)    MCH 29.8  26.0 - 34.0 (pg)    MCHC 31.9  30.0 - 36.0 (g/dL)    RDW 30.8 (*) 65.7 - 15.5 (%)    Platelets 153  150 - 400 (K/uL)   CREATININE, SERUM     Status: Abnormal   Collection Time   12/05/11  7:43 PM      Component Value Range Comment   Creatinine, Ser 4.77 (*) 0.50 - 1.10 (mg/dL)    GFR calc non Af Amer 9 (*) >90 (mL/min)    GFR calc Af Amer 10 (*) >90 (mL/min)   HEPATIC FUNCTION PANEL     Status: Abnormal   Collection Time   12/05/11  7:43 PM      Component Value Range Comment   Total Protein 7.1  6.0 - 8.3 (g/dL)    Albumin 4.0  3.5 - 5.2 (g/dL)    AST 846 (*) 0 - 37 (U/L)    ALT 97 (*) 0 - 35 (U/L)    Alkaline Phosphatase 89  39 - 117 (U/L)    Total Bilirubin 1.4 (*) 0.3 - 1.2 (mg/dL)    Bilirubin, Direct 0.6 (*) 0.0 - 0.3 (mg/dL)    Indirect Bilirubin 0.8  0.3 - 0.9 (mg/dL)   PROTIME-INR     Status: Abnormal   Collection Time   12/05/11  7:43 PM      Component Value Range Comment   Prothrombin Time 17.9 (*) 11.6 - 15.2 (seconds)    INR 1.45  0.00 - 1.49    APTT     Status: Normal   Collection Time   12/05/11  7:43 PM      Component Value Range Comment   aPTT 31  24 - 37 (seconds)   GLUCOSE, CAPILLARY     Status: Abnormal   Collection Time   12/05/11  9:51 PM      Component Value Range Comment   Glucose-Capillary 475 (*) 70 - 99 (mg/dL)    Comment 1 Notify RN      Comment 2 Documented in Chart       Dg Chest 2 View  12/05/2011  *RADIOLOGY REPORT*  Clinical Data:  Upper respiratory tract infection  CHEST - 2 VIEW  Comparison: 11/15/2011  Findings:   Heart size is normal.  No pleural effusion or pulmonary edema.  Bilateral, basilar predominant increased interstitial markings appear unchanged from previous exam.  No new findings.  IMPRESSION:  1.  No change in prominent interstitial markings as before. 2.  No superimposed airspace consolidation.  Original Report Authenticated By: Rosealee Albee, M.D.    Review of Systems  Constitutional: Positive for  malaise/fatigue. Negative for fever, chills, weight loss and diaphoresis.  Eyes: Negative for blurred vision and double vision.  Respiratory: Negative for cough.   Cardiovascular: Positive for orthopnea. Negative for chest pain.  Gastrointestinal: Positive for nausea (one  episode in ED on admission) and vomiting.  Genitourinary: Negative for dysuria.  Neurological: Negative for dizziness, tingling, sensory change, speech change, focal weakness, weakness and headaches.   Blood pressure 135/73, pulse 74, temperature 98.5 F (36.9 C), temperature source Oral, resp. rate 28, height 5\' 6"  (1.676 m), weight 78.4 kg (172 lb 13.5 oz), SpO2 97.00%. Physical Exam  Constitutional: She is oriented to person, place, and time. She appears well-developed and well-nourished. No distress.  HENT:  Head: Normocephalic and atraumatic.  Eyes: Pupils are equal, round, and reactive to light.  Neck: Normal range of motion. No tracheal deviation present. No thyromegaly present.  Cardiovascular: Normal rate and regular rhythm.   No murmur heard. Respiratory: Effort normal. No respiratory distress.  GI: Soft.  Musculoskeletal: Normal range of motion.  Neurological: She is alert and oriented to person, place, and time.  Skin: Skin is warm and dry. She is not diaphoretic.  Psychiatric: She has a normal mood and affect.    Assessment/Plan: 1. Hyperglycemia, perhaps borderline DKA with anion gap of 22. However, her CKD is likely affecting this as well. Her sugars are around 400, so we will initiate Lantus at a portion of her home dose until we see how she is eating. Novolog 10U given now and will cont AC TID Novolog SSI. I don't want to give her IVF b/c of her CKD. We will monitor her BMP q2hrs tonight to keep a close eye on her anion gap. RN to check CBG qhr tonight and text page results to me. Further tx plan depends on results of labs and CBGs tonight.   Thank you for the opportunity to participate in this nice  lady's care. We will cont to follow along.   30 minutes on consult.   KIRBY, Collie Wernick 12/06/2011, 12:01 AM

## 2011-12-06 NOTE — Progress Notes (Signed)
Update. Pt given doses of Novolog overnight. Sugars are coming down. In review of past BMPs, it seems she lives around a 20-25 CO2. Maren Reamer, NP [facility]

## 2011-12-06 NOTE — Progress Notes (Signed)
S:feels better and slept well O:BP 122/87  Pulse 72  Temp(Src) 97.1 F (36.2 C) (Oral)  Resp 27  Ht 5\' 6"  (1.676 m)  Wt 78.2 kg (172 lb 6.4 oz)  BMI 27.83 kg/m2  SpO2 99% No intake or output data in the 24 hours ending 12/06/11 0947 Weight change:  ZOX:WRUEA and alert CVS:RRR Resp:clear Abd:+ bs NTND Ext:no edema NEURO:CNI Ox3  Asterixis less      . albuterol  5 mg Nebulization Once  . aspirin EC  81 mg Oral Daily  . calcitRIOL  0.25 mcg Oral Daily  . clopidogrel  75 mg Oral Daily  . heparin  5,000 Units Subcutaneous Q8H  . hydrALAZINE  10 mg Oral TID  . insulin aspart  0-9 Units Subcutaneous TID WC  . insulin aspart  10 Units Subcutaneous NOW  . insulin aspart  12 Units Subcutaneous Once  . insulin aspart  5 Units Subcutaneous Once  . insulin aspart  5 Units Subcutaneous Once  . insulin glargine  10 Units Subcutaneous QHS  . ipratropium  0.5 mg Nebulization Once  . isosorbide mononitrate  30 mg Oral Daily  . metoprolol succinate  25 mg Oral Daily  . pantoprazole  40 mg Oral Daily  . predniSONE  60 mg Oral Once  . sodium chloride  3 mL Intravenous Q12H  . DISCONTD: aspirin  81 mg Oral Daily   Dg Chest 2 View  12/05/2011  *RADIOLOGY REPORT*  Clinical Data:  Upper respiratory tract infection  CHEST - 2 VIEW  Comparison: 11/15/2011  Findings:   Heart size is normal.  No pleural effusion or pulmonary edema.  Bilateral, basilar predominant increased interstitial markings appear unchanged from previous exam.  No new findings.  IMPRESSION:  1.  No change in prominent interstitial markings as before. 2.  No superimposed airspace consolidation.  Original Report Authenticated By: Rosealee Albee, M.D.   BMET    Component Value Date/Time   NA 131* 12/06/2011 0640   K 3.8 12/06/2011 0640   CL 89* 12/06/2011 0640   CO2 24 12/06/2011 0640   GLUCOSE 337* 12/06/2011 0640   BUN 88* 12/06/2011 0640   CREATININE 4.93* 12/06/2011 0640   CALCIUM 9.6 12/06/2011 0640   CALCIUM 9.6 08/28/2011  0937   GFRNONAA 8* 12/06/2011 0640   GFRAA 10* 12/06/2011 0640   CBC    Component Value Date/Time   WBC 7.0 12/06/2011 0640   RBC 3.07* 12/06/2011 0640   HGB 9.2* 12/06/2011 0640   HCT 29.0* 12/06/2011 0640   PLT 142* 12/06/2011 0640   MCV 94.5 12/06/2011 0640   MCH 30.0 12/06/2011 0640   MCHC 31.7 12/06/2011 0640   RDW 16.4* 12/06/2011 0640   LYMPHSABS 0.5* 12/05/2011 1108   MONOABS 0.3 12/05/2011 1108   EOSABS 0.0 12/05/2011 1108   BASOSABS 0.1 12/05/2011 1108     Assessment:  1. CKD 5  2.DM 3.Anemia 4 Sec HPTH on calcitriol   Plan: 1. She is feeling better and wants to hold off on HD.  I told her she needs to start eating and she assures me that she will.  She is close to the need for HD but it sounds like she contracted a viral URI which has made her feel worse than usual.  Will follow closely.  Recheck labs in am   Delmus Warwick T

## 2011-12-07 ENCOUNTER — Inpatient Hospital Stay (HOSPITAL_COMMUNITY): Payer: Medicare Other | Admitting: Anesthesiology

## 2011-12-07 ENCOUNTER — Encounter (HOSPITAL_COMMUNITY): Payer: Self-pay | Admitting: Anesthesiology

## 2011-12-07 ENCOUNTER — Encounter (HOSPITAL_COMMUNITY)
Admission: EM | Disposition: A | Discharge status: Home / Self Care | Payer: Self-pay | Source: Home / Self Care | Attending: Cardiology

## 2011-12-07 ENCOUNTER — Inpatient Hospital Stay (HOSPITAL_COMMUNITY): Payer: Medicare Other

## 2011-12-07 DIAGNOSIS — N186 End stage renal disease: Secondary | ICD-10-CM

## 2011-12-07 HISTORY — PX: INSERTION OF DIALYSIS CATHETER: SHX1324

## 2011-12-07 LAB — GLUCOSE, CAPILLARY
Glucose-Capillary: 81 mg/dL (ref 70–99)
Glucose-Capillary: 123 mg/dL — ABNORMAL HIGH (ref 70–99)
Glucose-Capillary: 242 mg/dL — ABNORMAL HIGH (ref 70–99)

## 2011-12-07 LAB — RENAL FUNCTION PANEL
BUN: 99 mg/dL — ABNORMAL HIGH (ref 6–23)
CO2: 22 mEq/L (ref 19–32)
Calcium: 9.6 mg/dL (ref 8.4–10.5)
GFR calc Af Amer: 9 mL/min — ABNORMAL LOW (ref >90)
Glucose, Bld: 82 mg/dL (ref 70–99)
Sodium: 131 mEq/L — ABNORMAL LOW (ref 135–145)

## 2011-12-07 LAB — URINE MICROSCOPIC-ADD ON

## 2011-12-07 LAB — URINALYSIS, ROUTINE W REFLEX MICROSCOPIC
Ketones, ur: 15 mg/dL — AB
Nitrite: NEGATIVE
Protein, ur: 30 mg/dL — AB

## 2011-12-07 LAB — HEMOGLOBIN A1C: Hgb A1c MFr Bld: 7.7 % — ABNORMAL HIGH (ref <5.7)

## 2011-12-07 SURGERY — INSERTION OF DIALYSIS CATHETER
Anesthesia: General | Site: Neck | Laterality: Right | Wound class: Clean

## 2011-12-07 MED ORDER — FENTANYL CITRATE 0.05 MG/ML IJ SOLN: 25.0000 ug | INTRAMUSCULAR | Status: DC | PRN

## 2011-12-07 MED ORDER — SODIUM CHLORIDE 0.9 % IV SOLN
INTRAVENOUS | Status: DC | PRN
  Administered 2011-12-07: 14:00:00 via INTRAVENOUS

## 2011-12-07 MED ORDER — GUAIFENESIN-DM 100-10 MG/5ML PO SYRP
5.0000 mL | ORAL_SOLUTION | ORAL | Status: DC | PRN
  Administered 2011-12-07 – 2011-12-09 (×4): 5 mL via ORAL
  Filled 2011-12-07 (×4): qty 5

## 2011-12-07 MED ORDER — CALCIUM ACETATE 667 MG PO CAPS
667.0000 mg | ORAL_CAPSULE | Freq: Three times a day (TID) | ORAL | Status: DC
  Administered 2011-12-08 – 2011-12-13 (×15): 667 mg via ORAL
  Filled 2011-12-07 (×21): qty 1

## 2011-12-07 MED ORDER — HEPARIN SODIUM (PORCINE) 5000 UNIT/ML IJ SOLN
INTRAMUSCULAR | Status: DC | PRN
  Administered 2011-12-07: 14:00:00

## 2011-12-07 MED ORDER — ONDANSETRON HCL 4 MG/2ML IJ SOLN
INTRAMUSCULAR | Status: DC | PRN
  Administered 2011-12-07: 4 mg via INTRAVENOUS

## 2011-12-07 MED ORDER — PROMETHAZINE HCL 25 MG/ML IJ SOLN: 6.2500 mg | INTRAMUSCULAR | Status: DC | PRN

## 2011-12-07 MED ORDER — DARBEPOETIN ALFA-POLYSORBATE 100 MCG/0.5ML IJ SOLN
100.0000 ug | INTRAMUSCULAR | Status: DC
  Administered 2011-12-08: 100 ug via INTRAVENOUS
  Filled 2011-12-07: qty 0.5

## 2011-12-07 MED ORDER — CEFAZOLIN SODIUM 1-5 GM-% IV SOLN
INTRAVENOUS | Status: DC | PRN
  Administered 2011-12-07: 1 g via INTRAVENOUS

## 2011-12-07 MED ORDER — HEPARIN SODIUM (PORCINE) 1000 UNIT/ML IJ SOLN
INTRAMUSCULAR | Status: DC | PRN
  Administered 2011-12-07: 4.6 mL

## 2011-12-07 MED ORDER — RENA-VITE PO TABS: 1.0000 | ORAL_TABLET | Freq: Every day | ORAL | Status: DC

## 2011-12-07 MED ORDER — LIDOCAINE-EPINEPHRINE (PF) 1 %-1:200000 IJ SOLN
INTRAMUSCULAR | Status: DC | PRN
  Administered 2011-12-07: 21 mL

## 2011-12-07 MED ORDER — ACETAMINOPHEN 325 MG PO TABS: 325.0000 mg | ORAL_TABLET | ORAL | Status: DC | PRN

## 2011-12-07 MED ORDER — PARICALCITOL 5 MCG/ML IV SOLN
2.0000 ug | INTRAVENOUS | Status: DC
  Administered 2011-12-08 – 2011-12-11 (×2): 2 ug via INTRAVENOUS
  Filled 2011-12-07 (×3): qty 0.4

## 2011-12-07 MED ORDER — CEFAZOLIN SODIUM 1-5 GM-% IV SOLN
INTRAVENOUS | Status: AC
  Filled 2011-12-07: qty 50

## 2011-12-07 MED ORDER — RENA-VITE PO TABS
1.0000 | ORAL_TABLET | Freq: Every day | ORAL | Status: DC
  Administered 2011-12-07 – 2011-12-12 (×6): 1 via ORAL
  Filled 2011-12-07 (×7): qty 1

## 2011-12-07 MED ORDER — SODIUM CHLORIDE 0.9 % IR SOLN
Status: DC | PRN
  Administered 2011-12-07: 1000 mL

## 2011-12-07 SURGICAL SUPPLY — 40 items
BAG DECANTER FOR FLEXI CONT (MISCELLANEOUS) ×2 IMPLANT
CATH CANNON HEMO 15F 50CM (CATHETERS) IMPLANT
CATH CANNON HEMO 15FR 19 (HEMODIALYSIS SUPPLIES) IMPLANT
CATH CANNON HEMO 15FR 23CM (HEMODIALYSIS SUPPLIES) ×2 IMPLANT
CATH CANNON HEMO 15FR 31CM (HEMODIALYSIS SUPPLIES) IMPLANT
CATH CANNON HEMO 15FR 32CM (HEMODIALYSIS SUPPLIES) IMPLANT
CLOTH BEACON ORANGE TIMEOUT ST (SAFETY) ×2 IMPLANT
COVER PROBE W GEL 5X96 (DRAPES) ×2 IMPLANT
COVER SURGICAL LIGHT HANDLE (MISCELLANEOUS) ×2 IMPLANT
DRAPE C-ARM 42X72 X-RAY (DRAPES) ×2 IMPLANT
DRAPE CHEST BREAST 15X10 FENES (DRAPES) ×2 IMPLANT
GAUZE SPONGE 2X2 8PLY STRL LF (GAUZE/BANDAGES/DRESSINGS) ×1 IMPLANT
GAUZE SPONGE 4X4 16PLY XRAY LF (GAUZE/BANDAGES/DRESSINGS) ×2 IMPLANT
GLOVE BIO SURGEON STRL SZ7 (GLOVE) ×2 IMPLANT
GLOVE BIOGEL PI IND STRL 6.5 (GLOVE) ×1 IMPLANT
GLOVE BIOGEL PI IND STRL 7.5 (GLOVE) ×2 IMPLANT
GLOVE BIOGEL PI INDICATOR 6.5 (GLOVE) ×1
GLOVE BIOGEL PI INDICATOR 7.5 (GLOVE) ×2
GLOVE SURG SS PI 7.5 STRL IVOR (GLOVE) ×2 IMPLANT
GOWN STRL NON-REIN LRG LVL3 (GOWN DISPOSABLE) ×4 IMPLANT
KIT BASIN OR (CUSTOM PROCEDURE TRAY) ×2 IMPLANT
KIT ROOM TURNOVER OR (KITS) ×2 IMPLANT
NEEDLE 18GX1X1/2 (RX/OR ONLY) (NEEDLE) ×2 IMPLANT
NEEDLE HYPO 25GX1X1/2 BEV (NEEDLE) ×2 IMPLANT
NS IRRIG 1000ML POUR BTL (IV SOLUTION) ×2 IMPLANT
PACK SURGICAL SETUP 50X90 (CUSTOM PROCEDURE TRAY) ×2 IMPLANT
PAD ARMBOARD 7.5X6 YLW CONV (MISCELLANEOUS) ×4 IMPLANT
SOAP 2 % CHG 4 OZ (WOUND CARE) ×2 IMPLANT
SPONGE GAUZE 2X2 STER 10/PKG (GAUZE/BANDAGES/DRESSINGS) ×1
SUT ETHILON 3 0 PS 1 (SUTURE) ×2 IMPLANT
SUT MNCRL AB 4-0 PS2 18 (SUTURE) ×2 IMPLANT
SYR 20CC LL (SYRINGE) ×4 IMPLANT
SYR 30ML LL (SYRINGE) IMPLANT
SYR 3ML LL SCALE MARK (SYRINGE) ×2 IMPLANT
SYR 5ML LL (SYRINGE) ×2 IMPLANT
SYR CONTROL 10ML LL (SYRINGE) ×2 IMPLANT
SYRINGE 10CC LL (SYRINGE) ×2 IMPLANT
TOWEL OR 17X24 6PK STRL BLUE (TOWEL DISPOSABLE) ×2 IMPLANT
TOWEL OR 17X26 10 PK STRL BLUE (TOWEL DISPOSABLE) ×2 IMPLANT
WATER STERILE IRR 1000ML POUR (IV SOLUTION) ×2 IMPLANT

## 2011-12-07 NOTE — Op Note (Signed)
OPERATIVE NOTE  PROCEDURE: 1. Right internal jugular vein tunneled dialysis catheter placement 2. Right internal jugular vein  cannulation under ultrasound guidance  PRE-OPERATIVE DIAGNOSIS: end-stage renal failure  POST-OPERATIVE DIAGNOSIS: same as above  SURGEON: Nilda Simmer, MD  ANESTHESIA: local  ESTIMATED BLOOD LOSS: minimal  FINDING(S): 1.  Tips of the catheter in the right atrium on fluoroscopy 2.  No obvious pneumothorax on fluoroscopy  SPECIMEN(S):  none  INDICATIONS:   [name] is a 66 y.o. female who presents with end stage renal disease.  The patient presents for tunneled dialysis catheter placement.  The patient is aware the risks of tunneled dialysis catheter placement include but are not limited to: bleeding, infection, central venous injury, pneumothorax, possible venous stenosis, possible malpositioning in the venous system, and possible infections related to long-term catheter presence.  The patient was aware of these risks and agreed to proceed.  DESCRIPTION: After written full informed consent was obtained from the patient, the patient was taken back to the operating room.  Prior to induction, the patient was given IV antibiotics.  After obtaining adequate sedation, the patient was prepped and draped in the standard fashion for a chest or neck tunneled dialysis catheter placement.  I anesthesized the neck cannulation site with local anesthetic, then under ultrasound guidance, the right internal jugular vein vein was cannulated with the 18 gauge needle.  A J-wire was then placed down in the IVC under fluroscopic guidance.  The wire was then secured in place with a clamp to the drapes.  The cannulation site, the catheter exit site, and tract for the subcutaneous tunnel were then anesthesized with a total of 20 cc of a 1:1 mixture of 0.5% Marcaine without epinepherine and 1% Lidocaine with epinepherine.  I then made stab incisions are the neck and exit sites.   I dissected from the chest to the neck and dilated the subcutaneous tunnel with a plastic dilator.  The wire was then unclamped and I removed the needle.  The skin tract and venotomy was dilated serially with dilators.  Finally, the dilator-sheath was placed under fluroscopic guidance into the superior vena cava.  The dilator and wire were removed.  A 23 cm Diatek catheter was placed under fluoroscopic guidance down into the right atrium.  The sheath was broken and peeled away while holding the catheter cuff at the level of the skin.  The back end of this catheter was transected, revealing the two lumens of this catheter.  The ports were docked onto these two lumens.  The catheter hub was then screwed into place.  Each port was tested by aspirating and flushing.  No resistance was noted.  Each port was then thoroughly flushed with heparinized saline.  The catheter was secured in placed with two interrupted stitches of 3-0 Nylon tied to the catheter.  The neck incision was closed with a U-stitch of 4-0 Monocryl.  The neck and chest incision were cleaned and sterile bandages applied.  Each port was then loaded with concentrated heparin (1000 Units/mL) at the manufacturer recommended volumes to each port.  Sterile caps were applied to each port.  On completion fluoroscopy, the tips of the catheter were in the right atrium, and there was no evidence of pneumothorax.  COMPLICATIONS: none  CONDITION: stable   Nilda Simmer, MD 12/07/2011 2:43 PM

## 2011-12-07 NOTE — Progress Notes (Signed)
Pt. Come as transfer from 3300,pt. Went to OR today and then transfer to 6700.Pt. Is alert and oriented,from home,some bruises around abdomen,legs and arms.Pt. Has small bruises on her sacrum,the skin is pink and intact,Keep monitoring pt. Closely.Pt. Was place on telemetry

## 2011-12-07 NOTE — Progress Notes (Signed)
S:feels worse today  Appetite poor O:BP 121/89  Pulse 82  Temp(Src) 98.5 F (36.9 C) (Oral)  Resp 15  Ht 5\' 6"  (1.676 m)  Wt 80.3 kg (177 lb 0.5 oz)  BMI 28.57 kg/m2  SpO2 96%  Intake/Output Summary (Last 24 hours) at 12/07/11 0906 Last data filed at 12/06/11 2244  Gross per 24 hour  Intake      3 ml  Output      0 ml  Net      3 ml   Weight change: 5.91 kg (13 lb 0.5 oz) ZOX:WRUEA and alert CVS:RRR Resp:clear Abd:+ bs NTND Ext:no edema NEURO:CNI Ox3  Asterixis less      . aspirin EC  81 mg Oral Daily  . calcitRIOL  0.25 mcg Oral Daily  . clopidogrel  75 mg Oral Daily  . heparin  5,000 Units Subcutaneous Q8H  . hydrALAZINE  10 mg Oral TID  . insulin aspart  0-20 Units Subcutaneous TID WC  . insulin aspart  0-5 Units Subcutaneous QHS  . insulin aspart  5 Units Subcutaneous TID WC  . insulin glargine  25 Units Subcutaneous QPM  . insulin glargine  30 Units Subcutaneous Daily  . isosorbide mononitrate  30 mg Oral Daily  . metoprolol succinate  25 mg Oral Daily  . pantoprazole  40 mg Oral Daily  . pneumococcal 23 valent vaccine  0.5 mL Intramuscular Tomorrow-1000  . sodium chloride  3 mL Intravenous Q12H  . DISCONTD: insulin aspart  0-9 Units Subcutaneous TID WC  . DISCONTD: insulin aspart  4 Units Subcutaneous TID WC  . DISCONTD: insulin glargine  10 Units Subcutaneous QHS  . DISCONTD: insulin glargine  25-30 Units Subcutaneous BID   Dg Chest 2 View  12/05/2011  *RADIOLOGY REPORT*  Clinical Data:  Upper respiratory tract infection  CHEST - 2 VIEW  Comparison: 11/15/2011  Findings:   Heart size is normal.  No pleural effusion or pulmonary edema.  Bilateral, basilar predominant increased interstitial markings appear unchanged from previous exam.  No new findings.  IMPRESSION:  1.  No change in prominent interstitial markings as before. 2.  No superimposed airspace consolidation.  Original Report Authenticated By: Rosealee Albee, M.D.   BMET    Component Value  Date/Time   NA 131* 12/07/2011 0505   K 3.6 12/07/2011 0505   CL 91* 12/07/2011 0505   CO2 22 12/07/2011 0505   GLUCOSE 82 12/07/2011 0505   BUN 99* 12/07/2011 0505   CREATININE 5.27* 12/07/2011 0505   CALCIUM 9.6 12/07/2011 0505   CALCIUM 9.6 08/28/2011 0937   GFRNONAA 8* 12/07/2011 0505   GFRAA 9* 12/07/2011 0505   CBC    Component Value Date/Time   WBC 7.0 12/06/2011 0640   RBC 3.07* 12/06/2011 0640   HGB 9.2* 12/06/2011 0640   HCT 29.0* 12/06/2011 0640   PLT 142* 12/06/2011 0640   MCV 94.5 12/06/2011 0640   MCH 30.0 12/06/2011 0640   MCHC 31.7 12/06/2011 0640   RDW 16.4* 12/06/2011 0640   LYMPHSABS 0.5* 12/05/2011 1108   MONOABS 0.3 12/05/2011 1108   EOSABS 0.0 12/05/2011 1108   BASOSABS 0.1 12/05/2011 1108     Assessment:  1. CKD 5 with uremic sxs of poor appetite and fatigue 2.DM 3.Anemia 4 Sec HPTH on calcitriol   Plan: 1.I think she needs to start HD as a lot of her symptomatology is chronic and suspicious of uremia.  She is agreeable.   2.  [facility] notified to  place perm cath..will do either today or in AM 3. Plan HD in AM 4. Could be transferred to floor 5. Will start clip process to get outpt HD arranged  Windsor Zirkelbach T

## 2011-12-07 NOTE — Transfer of Care (Signed)
Immediate Anesthesia Transfer of Care Note  Patient: [name]  Procedure(s) Performed: Procedure(s) (LRB): INSERTION OF DIALYSIS CATHETER (Right)  Patient Location: PACU  Anesthesia Type: MAC  Level of Consciousness: awake, alert  and oriented  Airway & Oxygen Therapy: Patient Spontanous Breathing and Patient connected to nasal cannula oxygen  Post-op Assessment: Report given to PACU RN and Post -op Vital signs reviewed and stable  Post vital signs: Reviewed  Complications: No apparent anesthesia complications

## 2011-12-07 NOTE — Preoperative (Signed)
Beta Blockers   Reason not to administer Beta Blockers:Not Applicable 

## 2011-12-07 NOTE — Anesthesia Preprocedure Evaluation (Signed)
Anesthesia Evaluation  Patient identified by MRN, date of birth, ID band Patient awake    Reviewed: Allergy & Precautions, H&P , NPO status , Patient's Chart, lab work & pertinent test results, reviewed documented beta blocker date and time   History of Anesthesia Complications Negative for: history of anesthetic complications  Airway Mallampati: II  Neck ROM: Full  Mouth opening: Limited Mouth Opening  Dental  (+) Edentulous Upper and Edentulous Lower   Pulmonary shortness of breath and with exertion, pneumonia ,    + decreased breath sounds      Cardiovascular hypertension, Pt. on medications and Pt. on home beta blockers + CAD, + Past MI and +CHF + Valvular Problems/Murmurs Rhythm:Regular Rate:Normal + Systolic murmurs    Neuro/Psych  Neuromuscular disease    GI/Hepatic hiatal hernia, GERD-  Medicated and Controlled,  Endo/Other  Diabetes mellitus-, Poorly Controlled, Type 2  Renal/GU Dialysis and ESRFRenal disease     Musculoskeletal   Abdominal (+) + obese,   Peds  Hematology   Anesthesia Other Findings IDDM (insulin dependent diabetes mellitus)     CAD (coronary artery disease)   a. 08/2005 - NSTEMI - Cath: Severe diffuse 3vd w/ NL EF - poor revasc candidate.;  b. NSTEMI 10/2011 treated medically     Neuropathy     Ulcer of foot   a. R foot - followed by Dr. Arbie Cookey & [facility]    Hypertension     DJD (degenerative joint disease)        Cataracts, bilateral 07/15/10   Hyperlipidemia        CKD (chronic kidney disease), stage V    s/p AV graft '09 that occluded. s/p RUE AV fistula 11/21/11  in anticipation of HD GERD (gastroesophageal reflux disease)        Hiatal hernia     Heart murmur        Carotid artery occlusion   a. s/p left CEA 06/2007;  b. 07/2011 u/s - RICA 1-39%, LICA patent CHF (congestive heart failure)   Mixed, Echo 11/16/11 Hypokinesis mid/apical inferior and mid/apical inferoseptal, mild LVH,  EF 45-50%, high LV filling pressure, mildly dilated RA . Felt due to worsening renal function. Not on ACEI/ARB secondary to renal insufficiency    History of stroke     Iron deficiency anemia   Hgb ~9 in February 2013    History of retinal detachment   a. right eye - repair 11/2007 History of osteomyelitis   a. Right foot    PVD (peripheral vascular disease)   a. ABI's 06/2011: R-37, L - 84     Reproductive/Obstetrics                           Anesthesia Physical  Anesthesia Plan  ASA: IV  Anesthesia Plan: MAC   Post-op Pain Management:    Induction: Intravenous  Airway Management Planned: Simple Face Mask and Natural Airway  Additional Equipment:   Intra-op Plan:   Post-operative Plan:   Informed Consent: I have reviewed the patients History and Physical, chart, labs and discussed the procedure including the risks, benefits and alternatives for the proposed anesthesia with the patient or authorized representative who has indicated his/her understanding and acceptance.     Plan Discussed with: CRNA and Surgeon  Anesthesia Plan Comments:         Anesthesia Quick Evaluation

## 2011-12-07 NOTE — Progress Notes (Signed)
SUBJECTIVE:  No acute SOB.  No chest pain   PHYSICAL EXAM Filed Vitals:   12/07/11 0000 12/07/11 0400 12/07/11 0500 12/07/11 0800  BP: 136/80 121/89  117/51  Pulse: 82   61  Temp: 98.7 F (37.1 C) 98.5 F (36.9 C)  98.4 F (36.9 C)  TempSrc: Oral Oral  Oral  Resp: 15   22  Height:      Weight:   80.3 kg (177 lb 0.5 oz)   SpO2: 96%   100%   General:  Weak appearing but no distress Lungs:  Clear Heart:  RRR Abdomen:  Positive bowel sounds, no rebound no guarding Extremities:  Diffuse mild edema.  LABS:  Results for orders placed during the hospital encounter of 12/05/11 (from the past 24 hour(s))  GLUCOSE, CAPILLARY     Status: Abnormal   Collection Time   12/06/11 12:43 PM      Component Value Range   Glucose-Capillary 139 (*) 70 - 99 (mg/dL)   Comment 1 Notify RN     Comment 2 Documented in Chart    GLUCOSE, CAPILLARY     Status: Abnormal   Collection Time   12/06/11  4:54 PM      Component Value Range   Glucose-Capillary 144 (*) 70 - 99 (mg/dL)   Comment 1 Documented in Chart     Comment 2 Notify RN    GLUCOSE, CAPILLARY     Status: Normal   Collection Time   12/06/11  9:42 PM      Component Value Range   Glucose-Capillary 79  70 - 99 (mg/dL)   Comment 1 Documented in Chart     Comment 2 Notify RN    URINALYSIS, ROUTINE W REFLEX MICROSCOPIC     Status: Abnormal   Collection Time   12/07/11  4:55 AM      Component Value Range   Color, Urine YELLOW  YELLOW    APPearance CLOUDY (*) CLEAR    Specific Gravity, Urine 1.016  1.005 - 1.030    pH 5.0  5.0 - 8.0    Glucose, UA NEGATIVE  NEGATIVE (mg/dL)   Hgb urine dipstick NEGATIVE  NEGATIVE    Bilirubin Urine NEGATIVE  NEGATIVE    Ketones, ur 15 (*) NEGATIVE (mg/dL)   Protein, ur 30 (*) NEGATIVE (mg/dL)   Urobilinogen, UA 0.2  0.0 - 1.0 (mg/dL)   Nitrite NEGATIVE  NEGATIVE    Leukocytes, UA LARGE (*) NEGATIVE   URINE MICROSCOPIC-ADD ON     Status: Abnormal   Collection Time   12/07/11  4:55 AM      Component  Value Range   Squamous Epithelial / LPF FEW (*) RARE    WBC, UA TOO NUMEROUS TO COUNT  <3 (WBC/hpf)   RBC / HPF 0-2  <3 (RBC/hpf)   Bacteria, UA FEW (*) RARE    Urine-Other MANY YEAST    RENAL FUNCTION PANEL     Status: Abnormal   Collection Time   12/07/11  5:05 AM      Component Value Range   Sodium 131 (*) 135 - 145 (mEq/L)   Potassium 3.6  3.5 - 5.1 (mEq/L)   Chloride 91 (*) 96 - 112 (mEq/L)   CO2 22  19 - 32 (mEq/L)   Glucose, Bld 82  70 - 99 (mg/dL)   BUN 99 (*) 6 - 23 (mg/dL)   Creatinine, Ser 1.61 (*) 0.50 - 1.10 (mg/dL)   Calcium 9.6  8.4 - 09.6 (mg/dL)  Phosphorus 6.2 (*) 2.3 - 4.6 (mg/dL)   Albumin 3.6  3.5 - 5.2 (g/dL)   GFR calc non Af Amer 8 (*) >90 (mL/min)   GFR calc Af Amer 9 (*) >90 (mL/min)  GLUCOSE, CAPILLARY     Status: Normal   Collection Time   12/07/11  8:03 AM      Component Value Range   Glucose-Capillary 81  70 - 99 (mg/dL)   Comment 1 Notify RN     Comment 2 Documented in Chart      Intake/Output Summary (Last 24 hours) at 12/07/11 1043 Last data filed at 12/06/11 2244  Gross per 24 hour  Intake      3 ml  Output      0 ml  Net      3 ml     ASSESSMENT AND PLAN:  1) 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    2) CAD: Recent NSTEMI.  No new symptoms.  Plan continued medical management.   3) CKD stage V: Creatinine continues to increase.  Plans for dialysis this admission noted.    4)Type II DM, uncontrolled with nephropathy: Blood sugars are now controlled.  We greatly appreciate the help of Dr. Dolphus Jenny et al.   Rollene Rotunda 12/07/2011 10:43 AM

## 2011-12-07 NOTE — H&P (Signed)
[facility] [place]  Brief Access History and Physical  History of Present Illness  [name] ABBRUZZESE is a 66 y.o. female who presents with chief complaint: need for temporary access.  The patient presents today for tunneled dialysis catheter placement.  She underwent right basilic vein transposition on 11/15/11.  Unfortunately, her BVT is not ready for use, so pt now needs tunneled dialysis catheter placement for initiation of hemodialysis.    Past Medical History  Diagnosis Date  . IDDM (insulin dependent diabetes mellitus)   . CAD (coronary artery disease)     a. 08/2005 - NSTEMI - Cath: Severe diffuse 3vd w/ NL EF - poor revasc candidate.;  b. NSTEMI 10/2011 treated medically   . Neuropathy   . Ulcer of foot     a. R foot - followed by Dr. Arbie Cookey & [facility]  . Hypertension   . DJD (degenerative joint disease)   . Cataracts, bilateral 07/15/10  . Hyperlipidemia   . CKD (chronic kidney disease), stage V      s/p AV graft '09 that occluded. s/p RUE AV fistula 11/21/11  in anticipation of HD  . GERD (gastroesophageal reflux disease)   . Hiatal hernia   . Heart murmur   . Carotid artery occlusion     a. s/p left CEA 06/2007;  b. 07/2011 u/s - RICA 1-39%, LICA patent  . CHF (congestive heart failure)     Mixed, Echo 11/16/11 Hypokinesis mid/apical inferior and mid/apical inferoseptal, mild LVH, EF 45-50%, high LV filling pressure, mildly dilated RA . Felt due to worsening renal function. Not on ACEI/ARB secondary to renal insufficiency  . History of stroke   . Iron deficiency anemia     Hgb ~9 in February 2013  . History of retinal detachment     a. right eye - repair 11/2007  . History of osteomyelitis     a. Right foot  . PVD (peripheral vascular disease)     a. ABI's 06/2011: R-37, L - 84     Past Surgical History  Procedure Date  . Cataract extraction   . Arteriovenous graft placement 2009  . Carotid endarterectomy 2008  . Av fistula placement  11/21/2011    Procedure: ARTERIOVENOUS (AV) FISTULA CREATION;  Surgeon: Chuck Hint, MD;  Location: [facility] OR;  Service: Vascular;  Laterality: N/A;  Right basilic vein transposition    History   Social History  . Marital Status: Widowed    Spouse Name: N/A    Number of Children: N/A  . Years of Education: N/A   Occupational History  . Not on file.   Social History Main Topics  . Smoking status: Former Smoker    Types: Cigarettes  . Smokeless tobacco: Not on file   Comment: smoked a pack/week for about 20 yrs.  Quit about 25 y ago  . Alcohol Use: No     rare beer  . Drug Use: No  . Sexually Active: Not on file   Other Topics Concern  . Not on file   Social History Narrative   Lives at home with husband in [place].  She is retired; used to work as a Lawyer. Has 2 sons and 1 daughter, all healthy.    Family History  Problem Relation Age of Onset  . Hypertension Other   . Stroke Other   . Heart attack Other   . Cancer Other   . Heart disease Other   . Diabetes Mother   .  Cancer Mother     lung - died 42  . Cirrhosis Father     died 54  . Cancer Sister     liver  . Cancer Son     lymphatic    No current facility-administered medications on file prior to encounter.   Current Outpatient Prescriptions on File Prior to Encounter  Medication Sig Dispense Refill  . aspirin (ASPIRIN LOW DOSE) 81 MG EC tablet Take 81 mg by mouth daily.        . Blood Glucose Monitoring Suppl (PRODIGY AUTOCODE BLOOD GLUCOSE) W/DEVICE KIT by Does not apply route 3 (three) times daily.        . calcitRIOL (ROCALTROL) 0.25 MCG capsule Take 0.25 mcg by mouth daily.        . clopidogrel (PLAVIX) 75 MG tablet Take 75 mg by mouth daily.        Marland Kitchen gabapentin (NEURONTIN) 100 MG tablet Take 100 mg by mouth 3 (three) times daily.        . Glucose Blood KIT by In Vitro route.        Marland Kitchen glucose blood test strip 1 each by Other route 3 (three) times daily. Use as instructed       . insulin aspart  (NOVOLOG) 100 UNIT/ML injection Inject 4-12 Units into the skin 3 (three) times daily before meals. Sliding scale        . insulin glargine (LANTUS) 100 UNIT/ML injection Inject 25-30 Units into the skin 2 (two) times daily. Inject 30 units in the am and 25 units in the pm daily        . Lancets MISC by Does not apply route 3 (three) times daily.        . pantoprazole (PROTONIX) 40 MG tablet Take 40 mg by mouth daily.         Allergies  Allergen Reactions  . Codeine Other (See Comments)    unknown    Review of Systems: Kidney Disease, As listed above, otherwise negative.  Physical Examination  Filed Vitals:   12/07/11 0000 12/07/11 0400 12/07/11 0500 12/07/11 0800  BP: 136/80 121/89  117/51  Pulse: 82   61  Temp: 98.7 F (37.1 C) 98.5 F (36.9 C)  98.4 F (36.9 C)  TempSrc: Oral Oral  Oral  Resp: 15   22  Height:      Weight:   177 lb 0.5 oz (80.3 kg)   SpO2: 96%   100%   Body mass index is 28.57 kg/(m^2).  General: A&O x 3, WDWN  Pulmonary: Sym exp, good air movt, CTAB, no rales, rhonchi, & wheezing  Cardiac: RRR, Nl S1, S2, no Murmurs, rubs or gallops  Gastrointestinal: soft, NTND, -G/R, - HSM, - masses, - CVAT B  Musculoskeletal: M/S 5/5 throughout , Extremities without ischemic changes , inc c/d/i in right arm, palpable thrill in right arm  Laboratory See iStat  Medical Decision Making  [name] is a 66 y.o. female who presents with: end stage renal disease requiring tunneled dialysis catheter placement.   The patient is scheduled for: tunneled dialysis catheter placement  The patient is aware the risks of tunneled dialysis catheter placement include but are not limited to: bleeding, infection, central venous injury, pneumothorax, possible venous stenosis, possible malpositioning in the venous system, and possible infections related to long-term catheter presence.   The patient is aware of the risks and agrees to proceed.  Leonides Sake, MD [facility] Office:  [phone] Pager: [phone]  12/07/2011, 1:47 PM

## 2011-12-07 NOTE — Anesthesia Procedure Notes (Signed)
Procedure Name: MAC Date/Time: 12/07/2011 2:07 PM Performed by: Marena Chancy Pre-anesthesia Checklist: Patient identified, Emergency Drugs available, Suction available and Patient being monitored Patient Re-evaluated:Patient Re-evaluated prior to inductionOxygen Delivery Method: Nasal cannula

## 2011-12-07 NOTE — Progress Notes (Signed)
[facility] - CONSULT F/U NOTE [place] TEAM 1 - Stepdown/ICU TEAM  Subjective: 66 yo female with hx of stage V renal failure nearing dialysis, CAD, mixed CHF, DM II, and PVD who presents to [facility] with complaints of worsened SOB and cough X4 days.  [facility] has been asked to follow as a consulting service to provide DM management.  Patient is laying in bed and appears comfortable She states she is breathing somewhat better. No other complaints.   Objective: Weight change: 5.91 kg (13 lb 0.5 oz)  Intake/Output Summary (Last 24 hours) at 12/07/11 1546 Last data filed at 12/07/11 1435  Gross per 24 hour  Intake    343 ml  Output      0 ml  Net    343 ml   Blood pressure 123/56, pulse 70, temperature 98.9 F (37.2 C), temperature source Oral, resp. rate 31, height 5\' 6"  (1.676 m), weight 80.3 kg (177 lb 0.5 oz), SpO2 98.00%.  Physical Exam: General: No acute respiratory distress at rest Lungs: Clear to auscultation bilaterally with only minor bibasilar crackles Cardiovascular: Regular rate without murmur gallop or rub normal S1 and S2 Abdomen: Nontender, nondistended, soft, bowel sounds positive, no rebound, no ascites, no appreciable mass Extremities: No significant cyanosis, clubbing, or edema bilateral lower extremities  Lab Results:  [facility] 12/07/11 0505 12/06/11 0640 12/06/11 0515  NA 131* 131* 130*  K 3.6 3.8 3.9  CL 91* 89* 89*  CO2 22 24 18*  GLUCOSE 82 337* 336*  BUN 99* 88* 87*  CREATININE 5.27* 4.93* 4.80*  CALCIUM 9.6 9.6 9.4  MG -- -- --  PHOS 6.2* 6.6* --    Basename 12/07/11 0505 12/06/11 0640 12/05/11 1943  AST -- 225* 193*  ALT -- 137* 97*  ALKPHOS -- 84 89  BILITOT -- 0.8 1.4*  PROT -- 6.9 7.1  ALBUMIN 3.6 3.8 --    Basename 12/06/11 0640 12/05/11 1943 12/05/11 1108  WBC 7.0 7.5 7.8  NEUTROABS -- -- 6.9  HGB 9.2* 8.9* 9.1*  HCT 29.0* 27.9* 28.2*  MCV 94.5 93.3 94.0  PLT 142* 153 168   Micro Results: Recent Results (from the past 240  hour(s))  MRSA PCR SCREENING     Status: Normal   Collection Time   12/05/11  7:12 PM      Component Value Range Status Comment   MRSA by PCR NEGATIVE  NEGATIVE  Final     Studies/Results: All recent x-ray/radiology reports have been reviewed in detail.   Medications: I have reviewed the patient's complete medication list.  Reccomendations:  DM Very poorly controlled - no convincing evidence of severe DKA at present - gap acidosis most likely related to progressive renal disease -  Medications adjusted with noted improvement in sugars- she is, however, NPO today. Will cont to follow and further adjust medications as needed.   hyponatremia Possibly from fluid overload; will be managed with dialysis  Multifactorial dyspnea As per primary service   CKD Stage IV Nephrology is following  CAD w/ recent NSTEMI Cards is the primary service  Mixed systolic and diastolic CHF As per primary service  HTN As per primary service and Nephrology  Hyperlipidemia  GERD/HH  Chronic Fe deficiency anemia  Disposition At discretion of primary service - pt is medically appropriate for tele bed from DM standpoint - we will be happy to continue to follow along with you  Calvert Cantor, MD [facility] Office  [phone] Pager [phone]  On-Call/Text Page:  [email or web address]      password [email or web address]

## 2011-12-08 ENCOUNTER — Encounter (HOSPITAL_COMMUNITY): Payer: Medicare Other

## 2011-12-08 LAB — GLUCOSE, CAPILLARY: Glucose-Capillary: 115 mg/dL — ABNORMAL HIGH (ref 70–99)

## 2011-12-08 LAB — BASIC METABOLIC PANEL
BUN: 98 mg/dL — ABNORMAL HIGH (ref 6–23)
Chloride: 90 mEq/L — ABNORMAL LOW (ref 96–112)
GFR calc Af Amer: 9 mL/min — ABNORMAL LOW (ref >90)
Glucose, Bld: 205 mg/dL — ABNORMAL HIGH (ref 70–99)
Potassium: 3.4 mEq/L — ABNORMAL LOW (ref 3.5–5.1)

## 2011-12-08 MED ORDER — DARBEPOETIN ALFA-POLYSORBATE 100 MCG/0.5ML IJ SOLN
INTRAMUSCULAR | Status: AC
  Administered 2011-12-08: 100 ug via INTRAVENOUS
  Filled 2011-12-08: qty 0.5

## 2011-12-08 MED ORDER — PARICALCITOL 5 MCG/ML IV SOLN
INTRAVENOUS | Status: AC
  Administered 2011-12-08: 2 ug via INTRAVENOUS
  Filled 2011-12-08: qty 1

## 2011-12-08 NOTE — Progress Notes (Addendum)
Patient: [name] Date of Encounter: 12/08/2011, 6:49 AM Admit date: 12/05/2011     Subjective   Pt notes generalized fatigue and continued SOB with laying flat; had to sleep in chair for a portion of the night. Persistent cough, no production. She denies chest pain. Continued poor appetite though she was able to eat some dinner yesterday evening.   Plan for dialysis this AM; pt received dialysis catheter yesterday without complication.   Objective   Telemetry: NSR, rate 60s, 1st degree HB  Physical Exam: Filed Vitals:   12/08/11 0602  BP: 126/55  Pulse: 68  Temp: 98.5 F (36.9 C)  Resp: 20   General: Chronically ill-appearing but more alert than prior, in no acute distress. Head: Normocephalic, atraumatic, sclera non-icteric, no xanthomas, nares are without discharge.  Neck: Negative for carotid bruits. No JVD. Lungs: Bilateral, mild, diffuse rales; diminished at bases, rare end expiratory wheeze. Breathing is unlabored. Heart: RRR, 2/6 systolic murmur heard best at upper RUSB. Abdomen: Soft, non-tender, non-distended with normoactive bowel sounds. No hepatomegaly. No rebound/guarding. No obvious abdominal masses. Msk:  Strength and tone appear normal for age. Extremities: No clubbing or cyanosis. Tr-1+ lower extremity edema up to mid shin bilaterally.  Distal pedal pulses are 1+ and equal bilaterally. Neuro: Alert and oriented X 3. Moves all extremities spontaneously. Psych:  Responds to questions appropriately though somewhat slowly, somewhat flat affect but much more engaged than before    Intake/Output Summary (Last 24 hours) at 12/08/11 0649 Last data filed at 12/08/11 0100  Gross per 24 hour  Intake    340 ml  Output    300 ml  Net     40 ml    Labs:  [facility] 12/07/11 0505 12/06/11 0640  NA 131* 131*  K 3.6 3.8  CL 91* 89*  CO2 22 24  GLUCOSE 82 337*  BUN 99* 88*  CREATININE 5.27* 4.93*  CALCIUM 9.6 9.6  MG -- --  PHOS 6.2* 6.6*    Basename  12/07/11 0505 12/06/11 0640 12/05/11 1943  AST -- 225* 193*  ALT -- 137* 97*  ALKPHOS -- 84 89  BILITOT -- 0.8 1.4*  PROT -- 6.9 7.1  ALBUMIN 3.6 3.8 --     Basename 12/06/11 0640 12/05/11 1943 12/05/11 1108  WBC 7.0 7.5 --  NEUTROABS -- -- 6.9  HGB 9.2* 8.9* --  HCT 29.0* 27.9* --  MCV 94.5 93.3 --  PLT 142* 153 --    Basename 12/07/11 0505  HGBA1C 7.7*    Radiology/Studies:  1. Chest 2 View 12/05/2011  *RADIOLOGY REPORT*  Clinical Data:  Upper respiratory tract infection  CHEST - 2 VIEW  Comparison: 11/15/2011  Findings:   Heart size is normal.  No pleural effusion or pulmonary edema.  Bilateral, basilar predominant increased interstitial markings appear unchanged from previous exam.  No new findings.  IMPRESSION:  1.  No change in prominent interstitial markings as before. 2.  No superimposed airspace consolidation.  Original Report Authenticated By: Rosealee Albee, M.D.   2.Dg Chest Port 1 View 12/07/2011  *RADIOLOGY REPORT*  Clinical Data: Renal failure.  Dialysis catheter placement.  PORTABLE CHEST - 1 VIEW  Comparison: 12/05/2011  Findings: A new dual lumen right jugular center venous catheter is seen, with distal tips at the cavoatrial junction and in the proximal to mid right atrium.  There is no evidence of pneumothorax.  Cardiomegaly stable.  Mild perihilar interstitial edema pattern is stable.  Peripheral lung fields are  clear.  No evidence of pulmonary consolidation or pleural effusion.  IMPRESSION:  1.  New right jugular dual lumen center venous catheter in appropriate position.  No evidence of pneumothorax. 2.  Stable cardiomegaly and mild perihilar interstitial edema.  Original Report Authenticated By: Danae Orleans, M.D.    Assessment and Plan   1. Acute on chronic mixed CHF (EF 45-50% on 10/2011 ECHO) : Clinically seems to have some extra fluid on board; will be managed by dialysis this AM. Suspect she will feel better with this. Follow strict I&O's and daily weights.  Continue Imdur, BB, hydralazine (not on ACEI/ARB secondary to renal insufficiency).  2. CAD s/p recent NSTEMI 10/2011: No chest pain. Continue regimen of ASA, BB, Plavix. Holding off on statin given elevated LFTs; transaminitis noted with no clear etiology, will follow up LFTs in AM (? Hepatic congestion). No chest pain.  3. CKD Stage V -> now ESRD: Dialysis catheter in place, plan for dialysis this AM. Appreciate Nephrology assistance. She is interested in a dietician consult to discuss proper diet for being on dialysis and this order is already in place.  4. Hyponatremia: Possibly secondary to fluid overload; will be managed by dialysis.   5. Type II DM, uncontrolled with nephropathy: Sugars within normal limits with Lantus and Novolog. Appreciate Internal Medicine assistance.   6. HTN: BPs well controlled; continue BB, Hydralazine.  Patient initially seen by Latina Craver, PA-S, later precepted by myself. Dr. Graciela Husbands to follow.  Signed, Ronie Spies PA-C

## 2011-12-08 NOTE — Anesthesia Postprocedure Evaluation (Signed)
Anesthesia Post Note  Patient: [name]  Procedure(s) Performed: Procedure(s) (LRB): INSERTION OF DIALYSIS CATHETER (Right)  Anesthesia type: MAC  Patient location: PACU  Post pain: Pain level controlled  Post assessment: Post-op Vital signs reviewed  Last Vitals:  Filed Vitals:   12/08/11 0602  BP: 126/55  Pulse: 68  Temp: 36.9 C  Resp: 20    Post vital signs: Reviewed  Level of consciousness: sedated  Complications: No apparent anesthesia complications

## 2011-12-08 NOTE — Progress Notes (Signed)
[facility] - CONSULT F/U NOTE [place] TEAM 1 - Stepdown/ICU TEAM  Subjective: 66 yo female with hx of stage V renal failure nearing dialysis, CAD, mixed CHF, DM II, and PVD who presents to [facility] with complaints of worsened SOB and cough X4 days.  [facility] has been asked to follow as a consulting service to provide DM management.  Evaluated patient after dialysis. She is feeling weak. Not eating much yet. Had a good dinner last night though.   Objective: Weight change: -1.147 kg (-2 lb 8.5 oz)  Intake/Output Summary (Last 24 hours) at 12/08/11 1629 Last data filed at 12/08/11 1500  Gross per 24 hour  Intake    600 ml  Output   1300 ml  Net   -700 ml   Blood pressure 118/63, pulse 70, temperature 98 F (36.7 C), temperature source Oral, resp. rate 16, height 5\' 6"  (1.676 m), weight 77.2 kg (170 lb 3.1 oz), SpO2 90.00%.  Physical Exam: General: No acute respiratory distress at rest Lungs: Clear to auscultation bilaterally with only minor bibasilar crackles Cardiovascular: Regular rate without murmur gallop or rub normal S1 and S2 Abdomen: Nontender, nondistended, soft, bowel sounds positive, no rebound, no ascites, no appreciable mass Extremities: No significant cyanosis, clubbing, or edema bilateral lower extremities  Lab Results:  [facility] 12/08/11 1113 12/07/11 0505 12/06/11 0640  NA 131* 131* 131*  K 3.4* 3.6 3.8  CL 90* 91* 89*  CO2 24 22 24   GLUCOSE 205* 82 337*  BUN 98* 99* 88*  CREATININE 5.07* 5.27* 4.93*  CALCIUM 8.9 9.6 9.6  MG -- -- --  PHOS -- 6.2* 6.6*    Basename 12/07/11 0505 12/06/11 0640 12/05/11 1943  AST -- 225* 193*  ALT -- 137* 97*  ALKPHOS -- 84 89  BILITOT -- 0.8 1.4*  PROT -- 6.9 7.1  ALBUMIN 3.6 3.8 --    Basename 12/06/11 0640 12/05/11 1943  WBC 7.0 7.5  NEUTROABS -- --  HGB 9.2* 8.9*  HCT 29.0* 27.9*  MCV 94.5 93.3  PLT 142* 153   Micro Results: Recent Results (from the past 240 hour(s))  MRSA PCR SCREENING     Status: Normal    Collection Time   12/05/11  7:12 PM      Component Value Range Status Comment   MRSA by PCR NEGATIVE  NEGATIVE  Final     Studies/Results: All recent x-ray/radiology reports have been reviewed in detail.   Medications: I have reviewed the patient's complete medication list.  Reccomendations:  DM Very poorly controlled - no convincing evidence of severe DKA at present - gap acidosis most likely related to progressive renal disease -  Medications adjusted with noted improvement in sugars-No further adjustment today - expect sugars to rise when eating well. Will follow.   hyponatremia Possibly from fluid overload; will be managed with dialysis  Multifactorial dyspnea As per primary service   CKD Stage IV Nephrology is following  CAD w/ recent NSTEMI Cards is the primary service  Mixed systolic and diastolic CHF As per primary service  HTN As per primary service and Nephrology  Hyperlipidemia  GERD/HH  Chronic Fe deficiency anemia  Calvert Cantor, MD [facility] Office  [phone] Pager [phone]  On-Call/Text Page:      [email or web address]      password [facility]

## 2011-12-08 NOTE — Progress Notes (Signed)
Nutrition Education  Nutrition dx:  Nutrition-related knowledge deficit r/t renal diet on HD AEB pt request.  Intervention:  Brief education;  Provided while pt received HD treatment (Renal Booklet provided and discussed).  Goals of nutrition therapy discussed.  Understanding confirmed.    Monitoring:  Knowledge; for questions.  Please consult RD if new questions present.

## 2011-12-08 NOTE — Progress Notes (Signed)
S: feels a little better.  + cough though only used cough supp once O:BP 128/70  Pulse 75  Temp(Src) 98.1 F (36.7 C) (Oral)  Resp 20  Ht 5\' 6"  (1.676 m)  Wt 79.153 kg (174 lb 8 oz)  BMI 28.17 kg/m2  SpO2 95%  Intake/Output Summary (Last 24 hours) at 12/08/11 0914 Last data filed at 12/08/11 1610  Gross per 24 hour  Intake    220 ml  Output    300 ml  Net    -80 ml   Weight change: -1.147 kg (-2 lb 8.5 oz) RUE:AVWUJ and alert CVS:RRR Resp:scattered rhonchi and rare wheeze Abd:+ bs NTND Ext:no edema NEURO:CNI Ox3  Asterixis less Rt perm cath     . aspirin EC  81 mg Oral Daily  . calcium acetate  667 mg Oral TID WC  . clopidogrel  75 mg Oral Daily  . darbepoetin (ARANESP) injection - DIALYSIS  100 mcg Intravenous Q Fri-HD  . hydrALAZINE  10 mg Oral TID  . insulin aspart  0-20 Units Subcutaneous TID WC  . insulin aspart  0-5 Units Subcutaneous QHS  . insulin aspart  5 Units Subcutaneous TID WC  . insulin glargine  25 Units Subcutaneous QPM  . insulin glargine  30 Units Subcutaneous Daily  . isosorbide mononitrate  30 mg Oral Daily  . metoprolol succinate  25 mg Oral Daily  . multivitamin  1 tablet Oral QHS  . pantoprazole  40 mg Oral Daily  . paricalcitol  2 mcg Intravenous 3 times weekly  . pneumococcal 23 valent vaccine  0.5 mL Intramuscular Tomorrow-1000  . sodium chloride  3 mL Intravenous Q12H  . DISCONTD: calcitRIOL  0.25 mcg Oral Daily  . DISCONTD: heparin  5,000 Units Subcutaneous Q8H  . DISCONTD: multivitamin  1 tablet Oral Daily   Dg Chest Port 1 View  12/07/2011  *RADIOLOGY REPORT*  Clinical Data: Renal failure.  Dialysis catheter placement.  PORTABLE CHEST - 1 VIEW  Comparison: 12/05/2011  Findings: A new dual lumen right jugular center venous catheter is seen, with distal tips at the cavoatrial junction and in the proximal to mid right atrium.  There is no evidence of pneumothorax.  Cardiomegaly stable.  Mild perihilar interstitial edema pattern is stable.   Peripheral lung fields are clear.  No evidence of pulmonary consolidation or pleural effusion.  IMPRESSION:  1.  New right jugular dual lumen center venous catheter in appropriate position.  No evidence of pneumothorax. 2.  Stable cardiomegaly and mild perihilar interstitial edema.  Original Report Authenticated By: Danae Orleans, M.D.   Dg Fluoro Guide Cv Line-no Report  12/07/2011  CLINICAL DATA: intraoperative placement   FLOURO GUIDE CV LINE  Fluoroscopy was utilized by the requesting physician.  No radiographic  interpretation.     BMET    Component Value Date/Time   NA 131* 12/07/2011 0505   K 3.6 12/07/2011 0505   CL 91* 12/07/2011 0505   CO2 22 12/07/2011 0505   GLUCOSE 82 12/07/2011 0505   BUN 99* 12/07/2011 0505   CREATININE 5.27* 12/07/2011 0505   CALCIUM 9.6 12/07/2011 0505   CALCIUM 9.6 08/28/2011 0937   GFRNONAA 8* 12/07/2011 0505   GFRAA 9* 12/07/2011 0505   CBC    Component Value Date/Time   WBC 7.0 12/06/2011 0640   RBC 3.07* 12/06/2011 0640   HGB 9.2* 12/06/2011 0640   HCT 29.0* 12/06/2011 0640   PLT 142* 12/06/2011 0640   MCV 94.5 12/06/2011 0640  MCH 30.0 12/06/2011 0640   MCHC 31.7 12/06/2011 0640   RDW 16.4* 12/06/2011 0640   LYMPHSABS 0.5* 12/05/2011 1108   MONOABS 0.3 12/05/2011 1108   EOSABS 0.0 12/05/2011 1108   BASOSABS 0.1 12/05/2011 1108     Assessment:  1. CKD 5 with uremic sxs of poor appetite and fatigue 2.DM 3.Anemia 4 Sec HPTH on calcitriol   Plan: 1.HD today  [name]

## 2011-12-09 ENCOUNTER — Inpatient Hospital Stay (HOSPITAL_COMMUNITY): Payer: Medicare Other

## 2011-12-09 LAB — CBC
Hemoglobin: 8.9 g/dL — ABNORMAL LOW (ref 12.0–15.0)
MCH: 30.4 pg (ref 26.0–34.0)
MCHC: 31.4 g/dL (ref 30.0–36.0)

## 2011-12-09 LAB — HEPATITIS B CORE ANTIBODY, TOTAL: Hep B Core Total Ab: NEGATIVE

## 2011-12-09 LAB — COMPREHENSIVE METABOLIC PANEL
AST: 159 U/L — ABNORMAL HIGH (ref 0–37)
Albumin: 3.4 g/dL — ABNORMAL LOW (ref 3.5–5.2)
Calcium: 9.2 mg/dL (ref 8.4–10.5)
Creatinine, Ser: 3.96 mg/dL — ABNORMAL HIGH (ref 0.50–1.10)
GFR calc non Af Amer: 11 mL/min — ABNORMAL LOW (ref >90)

## 2011-12-09 LAB — GLUCOSE, CAPILLARY
Glucose-Capillary: 132 mg/dL — ABNORMAL HIGH (ref 70–99)
Glucose-Capillary: 173 mg/dL — ABNORMAL HIGH (ref 70–99)
Glucose-Capillary: 165 mg/dL — ABNORMAL HIGH (ref 70–99)

## 2011-12-09 MED ORDER — INSULIN GLARGINE 100 UNIT/ML ~~LOC~~ SOLN
30.0000 [IU] | Freq: Two times a day (BID) | SUBCUTANEOUS | Status: DC
  Administered 2011-12-10 – 2011-12-11 (×3): 30 [IU] via SUBCUTANEOUS

## 2011-12-09 NOTE — Progress Notes (Addendum)
S: First HD yest, 1000 cc off, + SOB today, coughing O:BP 95/62  Pulse 62  Temp(Src) 98.2 F (36.8 C) (Oral)  Resp 20  Ht 5\' 6"  (1.676 m)  Wt 77.1 kg (169 lb 15.6 oz)  BMI 27.43 kg/m2  SpO2 92%  Intake/Output Summary (Last 24 hours) at 12/09/11 1245 Last data filed at 12/09/11 0900  Gross per 24 hour  Intake   1200 ml  Output      0 ml  Net   1200 ml   Weight change: -0.353 kg (-12.4 oz) ZOX:WRUEA and alert CVS:RRR Resp: + rhonchi, bilat rales 1/3 up post Abd:+ bs NTND Ext:  1+ edema LE's NEURO:CNI Ox3  Asterixis less Rt perm cath     . aspirin EC  81 mg Oral Daily  . calcium acetate  667 mg Oral TID WC  . clopidogrel  75 mg Oral Daily  . darbepoetin (ARANESP) injection - DIALYSIS  100 mcg Intravenous Q Fri-HD  . insulin aspart  0-20 Units Subcutaneous TID WC  . insulin aspart  0-5 Units Subcutaneous QHS  . insulin aspart  5 Units Subcutaneous TID WC  . insulin glargine  25 Units Subcutaneous QPM  . insulin glargine  30 Units Subcutaneous Daily  . isosorbide mononitrate  30 mg Oral Daily  . multivitamin  1 tablet Oral QHS  . pantoprazole  40 mg Oral Daily  . paricalcitol  2 mcg Intravenous 3 times weekly  . sodium chloride  3 mL Intravenous Q12H  . DISCONTD: hydrALAZINE  10 mg Oral TID  . DISCONTD: metoprolol succinate  25 mg Oral Daily   Dg Chest Port 1 View  12/07/2011  *RADIOLOGY REPORT*  Clinical Data: Renal failure.  Dialysis catheter placement.  PORTABLE CHEST - 1 VIEW  Comparison: 12/05/2011  Findings: A new dual lumen right jugular center venous catheter is seen, with distal tips at the cavoatrial junction and in the proximal to mid right atrium.  There is no evidence of pneumothorax.  Cardiomegaly stable.  Mild perihilar interstitial edema pattern is stable.  Peripheral lung fields are clear.  No evidence of pulmonary consolidation or pleural effusion.  IMPRESSION:  1.  New right jugular dual lumen center venous catheter in appropriate position.  No evidence of  pneumothorax. 2.  Stable cardiomegaly and mild perihilar interstitial edema.  Original Report Authenticated By: Danae Orleans, M.D.   Dg Fluoro Guide Cv Line-no Report  12/07/2011  CLINICAL DATA: intraoperative placement   FLOURO GUIDE CV LINE  Fluoroscopy was utilized by the requesting physician.  No radiographic  interpretation.     BMET    Component Value Date/Time   NA 136 12/09/2011 0948   K 3.7 12/09/2011 0948   CL 98 12/09/2011 0948   CO2 25 12/09/2011 0948   GLUCOSE 201* 12/09/2011 0948   BUN 64* 12/09/2011 0948   CREATININE 3.96* 12/09/2011 0948   CALCIUM 9.2 12/09/2011 0948   CALCIUM 9.6 08/28/2011 0937   GFRNONAA 11* 12/09/2011 0948   GFRAA 13* 12/09/2011 0948   CBC    Component Value Date/Time   WBC 6.3 12/09/2011 0948   RBC 2.93* 12/09/2011 0948   HGB 8.9* 12/09/2011 0948   HCT 28.3* 12/09/2011 0948   PLT 138* 12/09/2011 0948   MCV 96.6 12/09/2011 0948   MCH 30.4 12/09/2011 0948   MCHC 31.4 12/09/2011 0948   RDW 16.5* 12/09/2011 0948   LYMPHSABS 0.5* 12/05/2011 1108   MONOABS 0.3 12/05/2011 1108   EOSABS 0.0 12/05/2011 1108  BASOSABS 0.1 12/05/2011 1108     Assessment: 1. CKD 5 with uremic sxs of poor appetite and fatigue- HD started yest, + vol overload with symptoms, inc UF today with 2nd HD. BP's soft, will d/c BB and hydralazine to afford better BP 's for vol removal. 2.DM 3.Anemia 4 Sec HPTH on calcitriol   Vinson Moselle  MD [facility] [phone] pgr    [phone] cell 12/09/2011, 12:47 PM

## 2011-12-09 NOTE — Progress Notes (Signed)
Patient ID: [name], female   DOB: Jun 13, 1946, 66 y.o.   MRN: [id number] Subjective:  Dyspnea improved. No chest pain  Objective:  Vital Signs in the last 24 hours: Temp:  [98 F (36.7 C)-98.6 F (37 C)] 98.6 F (37 C) (03/16 0536) Pulse Rate:  [64-75] 66  (03/16 0536) Resp:  [16-21] 20  (03/16 0536) BP: (107-128)/(50-70) 107/50 mmHg (03/16 0536) SpO2:  [90 %-99 %] 95 % (03/16 0536) Weight:  [77.1 kg (169 lb 15.6 oz)-78.8 kg (173 lb 11.6 oz)] 77.1 kg (169 lb 15.6 oz) (03/15 2105)  Intake/Output from previous day: 03/15 0701 - 03/16 0700 In: 1200 [P.O.:1200] Out: 1000  Intake/Output from this shift:    Physical Exam: Well appearing NAD HEENT: Unremarkable Neck:  7 cm JVD, no thyromegally Lymphatics:  No adenopathy Back:  No CVA tenderness Lungs:  Clear except for basilar rales. HEART:  Regular rate rhythm, no murmurs, no rubs, no clicks Abd:  Flat, positive bowel sounds, no organomegally, no rebound, no guarding Ext:  2 plus pulses, trace edema, no cyanosis, no clubbing Skin:  No rashes no nodules Neuro:  CN II through XII intact, motor grossly intact  Lab Results: No results found for this basename: WBC:2,HGB:2,PLT:2 in the last 72 hours  Basename 12/08/11 1113 12/07/11 0505  NA 131* 131*  K 3.4* 3.6  CL 90* 91*  CO2 24 22  GLUCOSE 205* 82  BUN 98* 99*  CREATININE 5.07* 5.27*   No results found for this basename: TROPONINI:2,CK,MB:2 in the last 72 hours Hepatic Function Panel  Basename 12/07/11 0505  PROT --  ALBUMIN 3.6  AST --  ALT --  ALKPHOS --  BILITOT --  BILIDIR --  IBILI --   No results found for this basename: CHOL in the last 72 hours No results found for this basename: PROTIME in the last 72 hours  Imaging: Dg Chest Port 1 View  12/07/2011  *RADIOLOGY REPORT*  Clinical Data: Renal failure.  Dialysis catheter placement.  PORTABLE CHEST - 1 VIEW  Comparison: 12/05/2011  Findings: A new dual lumen right jugular center venous catheter is  seen, with distal tips at the cavoatrial junction and in the proximal to mid right atrium.  There is no evidence of pneumothorax.  Cardiomegaly stable.  Mild perihilar interstitial edema pattern is stable.  Peripheral lung fields are clear.  No evidence of pulmonary consolidation or pleural effusion.  IMPRESSION:  1.  New right jugular dual lumen center venous catheter in appropriate position.  No evidence of pneumothorax. 2.  Stable cardiomegaly and mild perihilar interstitial edema.  Original Report Authenticated By: Danae Orleans, M.D.   Dg Fluoro Guide Cv Line-no Report  12/07/2011  CLINICAL DATA: intraoperative placement   FLOURO GUIDE CV LINE  Fluoroscopy was utilized by the requesting physician.  No radiographic  interpretation.      Cardiac Studies:  Assessment/Plan:  1. Acute/chronic systolic/Diastolic CHF 2. ESRD newly started HD 3. CAD, no angina Rec: Volume status improving. Follow daily weights. Low sodium diet. Continue CHF meds. Dyspnea should improve with more dialysis. Please call for additional questions.   LOS: 4 days    Lewayne Bunting 12/09/2011, 8:12 AM

## 2011-12-09 NOTE — Progress Notes (Signed)
[facility] - CONSULT F/U NOTE [place] TEAM 1 - Stepdown/ICU TEAM  Subjective: 66 yo female with hx of stage V renal failure nearing dialysis, CAD, mixed CHF, DM II, and PVD who presents to [facility] with complaints of worsened SOB and cough X4 days.  [facility] has been asked to follow as a consulting service to provide DM management.  Evaluated patient after dialysis. She is feeling weak. Not eating much yet. Had a good dinner last night though.   Objective: Weight change: -0.353 kg (-12.4 oz)  Intake/Output Summary (Last 24 hours) at 12/09/11 1657 Last data filed at 12/09/11 1300  Gross per 24 hour  Intake   1440 ml  Output      0 ml  Net   1440 ml   Blood pressure 145/84, pulse 69, temperature 98.7 F (37.1 C), temperature source Oral, resp. rate 18, height 5\' 6"  (1.676 m), weight 77.1 kg (169 lb 15.6 oz), SpO2 95.00%.  Physical Exam: General: No acute respiratory distress at rest Lungs: Clear to auscultation bilaterally with only minor bibasilar crackles Cardiovascular: Regular rate without murmur gallop or rub normal S1 and S2 Abdomen: Nontender, nondistended, soft, bowel sounds positive, no rebound, no ascites, no appreciable mass Extremities: No significant cyanosis, clubbing, or edema bilateral lower extremities  Lab Results:  [facility] 12/09/11 0948 12/08/11 1113 12/07/11 0505  NA 136 131* 131*  K 3.7 3.4* 3.6  CL 98 90* 91*  CO2 25 24 22   GLUCOSE 201* 205* 82  BUN 64* 98* 99*  CREATININE 3.96* 5.07* 5.27*  CALCIUM 9.2 8.9 9.6  MG -- -- --  PHOS -- -- 6.2*    Basename 12/09/11 0948 12/07/11 0505  AST 159* --  ALT 134* --  ALKPHOS 68 --  BILITOT 0.5 --  PROT 6.4 --  ALBUMIN 3.4* 3.6    Basename 12/09/11 0948  WBC 6.3  NEUTROABS --  HGB 8.9*  HCT 28.3*  MCV 96.6  PLT 138*   Micro Results: Recent Results (from the past 240 hour(s))  MRSA PCR SCREENING     Status: Normal   Collection Time   12/05/11  7:12 PM      Component Value Range Status Comment     MRSA by PCR NEGATIVE  NEGATIVE  Final     Studies/Results: All recent x-ray/radiology reports have been reviewed in detail.   Medications: I have reviewed the patient's complete medication list.  Reccomendations:  DM -Blood sugar well controlled for the last 36 hours. Patient hemoglobin A1c 7.7 (no demonstrating poor control diabetes as an outpatient); at this moment higher CBG into 170s range. Will continue a sliding scale insulin, Lantus 30 units BID and also 5 units of NovoLog for meal coverage 3 times a day. Patient eating better.   hyponatremia Possibly from fluid overload; to be managed with dialysis  Multifactorial dyspnea As per primary service   CKD Stage IV Per renal service.  CAD w/ recent NSTEMI Cards is the primary service and will decide further treatment  Mixed systolic and diastolic CHF As per primary service (cardiology)  HTN As per primary service and Nephrology  GERD/HH: Continue PPIs  Anemia of Chronic disease: Per renal.   At this moment internal medicine will sign off; please call if any further assistance needed managing patient diabetes.  Vassie Loll, MD [facility] Office  [phone] Pager [phone]

## 2011-12-10 LAB — GLUCOSE, CAPILLARY
Glucose-Capillary: 79 mg/dL (ref 70–99)
Glucose-Capillary: 154 mg/dL — ABNORMAL HIGH (ref 70–99)

## 2011-12-10 MED ORDER — HEPARIN SODIUM (PORCINE) 1000 UNIT/ML DIALYSIS
20.0000 [IU]/kg | INTRAMUSCULAR | Status: DC | PRN
Start: 2011-12-10 — End: 2011-12-13
  Administered 2011-12-11: 1500 [IU] via INTRAVENOUS_CENTRAL
  Filled 2011-12-10: qty 2

## 2011-12-10 NOTE — Progress Notes (Signed)
Patient ID: [name], female   DOB: [birth year], 66 y.o.   MRN: [id number] Subjective:  Dyspnea improved. No chest pain. sleepy  Objective:  Vital Signs in the last 24 hours: Temp:  [97.8 F (36.6 C)-98.7 F (37.1 C)] 97.8 F (36.6 C) (03/17 0512) Pulse Rate:  [59-69] 60  (03/17 0512) Resp:  [12-20] 20  (03/17 0512) BP: (95-145)/(47-84) 103/60 mmHg (03/17 0512) SpO2:  [92 %-98 %] 95 % (03/17 0512) Weight:  [75.4 kg (166 lb 3.6 oz)-77.3 kg (170 lb 6.7 oz)] 75.4 kg (166 lb 3.6 oz) (03/16 2204)  Intake/Output from previous day: 03/16 0701 - 03/17 0700 In: 1450 [P.O.:1450] Out: 2600 [Urine:100] Intake/Output from this shift:    Physical Exam: Chronically ill appearing NAD HEENT: Unremarkable Neck:  6 cm JVD, no thyromegally Lungs:  Clear except for basilar rales. HEART:  Regular rate rhythm, no murmurs, no rubs, no clicks Abd:  Flat, positive bowel sounds, no organomegally, no rebound, no guarding Ext:  2 plus pulses, trace edema, no cyanosis, no clubbing Skin:  No rashes no nodules Neuro:  CN II through XII intact, motor grossly intact  Lab Results:  Basename 12/09/11 0948  WBC 6.3  HGB 8.9*  PLT 138*    Basename 12/09/11 0948 12/08/11 1113  NA 136 131*  K 3.7 3.4*  CL 98 90*  CO2 25 24  GLUCOSE 201* 205*  BUN 64* 98*  CREATININE 3.96* 5.07*   No results found for this basename: TROPONINI:2,CK,MB:2 in the last 72 hours Hepatic Function Panel  Basename 12/09/11 0948  PROT 6.4  ALBUMIN 3.4*  AST 159*  ALT 134*  ALKPHOS 68  BILITOT 0.5  BILIDIR --  IBILI --   No results found for this basename: CHOL in the last 72 hours No results found for this basename: PROTIME in the last 72 hours  Imaging: No results found.  Cardiac Studies: Tele - nsr Assessment/Plan:  1. Acute/chronic systolic/Diastolic CHF 2. ESRD newly started HD 3. CAD, no angina Rec: Volume status improving. Follow daily weights. Low sodium diet. Continue CHF meds. Dyspnea should  improve with more dialysis. Consider discharge after dialysis tomorrow.  LOS: 5 days    Lewayne Bunting 12/10/2011, 8:37 AM

## 2011-12-10 NOTE — Progress Notes (Signed)
S: SEcond HD yest, 2.5 kg off, still a little dyspneic at night, but better.   O:BP 127/70  Pulse 64  Temp(Src) 98 F (36.7 C) (Oral)  Resp 20  Ht 5\' 6"  (1.676 m)  Wt 75.4 kg (166 lb 3.6 oz)  BMI 26.83 kg/m2  SpO2 99%  Intake/Output Summary (Last 24 hours) at 12/10/11 1119 Last data filed at 12/10/11 0900  Gross per 24 hour  Intake   1350 ml  Output   2600 ml  Net  -1250 ml   Weight change: -1.5 kg (-3 lb 4.9 oz) WUJ:WJXBJ and alert CVS:RRR Resp: decreased rales, now faint at bases Abd:+ bs NTND Ext:  1+ edema LE's NEURO:CNI Ox3  Asterixis gone Rt perm cath     . aspirin EC  81 mg Oral Daily  . calcium acetate  667 mg Oral TID WC  . clopidogrel  75 mg Oral Daily  . darbepoetin (ARANESP) injection - DIALYSIS  100 mcg Intravenous Q Fri-HD  . insulin aspart  0-20 Units Subcutaneous TID WC  . insulin aspart  0-5 Units Subcutaneous QHS  . insulin aspart  5 Units Subcutaneous TID WC  . insulin glargine  30 Units Subcutaneous BID  . isosorbide mononitrate  30 mg Oral Daily  . multivitamin  1 tablet Oral QHS  . pantoprazole  40 mg Oral Daily  . paricalcitol  2 mcg Intravenous 3 times weekly  . sodium chloride  3 mL Intravenous Q12H  . DISCONTD: hydrALAZINE  10 mg Oral TID  . DISCONTD: insulin glargine  25 Units Subcutaneous QPM  . DISCONTD: insulin glargine  30 Units Subcutaneous Daily  . DISCONTD: metoprolol succinate  25 mg Oral Daily   No results found. BMET    Component Value Date/Time   NA 136 12/09/2011 0948   K 3.7 12/09/2011 0948   CL 98 12/09/2011 0948   CO2 25 12/09/2011 0948   GLUCOSE 201* 12/09/2011 0948   BUN 64* 12/09/2011 0948   CREATININE 3.96* 12/09/2011 0948   CALCIUM 9.2 12/09/2011 0948   CALCIUM 9.6 08/28/2011 0937   GFRNONAA 11* 12/09/2011 0948   GFRAA 13* 12/09/2011 0948   CBC    Component Value Date/Time   WBC 6.3 12/09/2011 0948   RBC 2.93* 12/09/2011 0948   HGB 8.9* 12/09/2011 0948   HCT 28.3* 12/09/2011 0948   PLT 138* 12/09/2011 0948   MCV  96.6 12/09/2011 0948   MCH 30.4 12/09/2011 0948   MCHC 31.4 12/09/2011 0948   RDW 16.5* 12/09/2011 0948   LYMPHSABS 0.5* 12/05/2011 1108   MONOABS 0.3 12/05/2011 1108   EOSABS 0.0 12/05/2011 1108   BASOSABS 0.1 12/05/2011 1108     Assessment/Rec's: 1. CKD 5, new ESRD (with uremic sxs of poor appetite and fatigue, asterixis and pulm edema)-  Second HD yesterday, doing better. Still vol overloaded, HD again in am on Monday with 3-4 kg UF.  BP's soft, d/c'd BB and hydralazine to afford better BP 's for vol removal.  Has new TDC and recently placed RUA BVT fistula. Needs CLIP process to start Monday. 2. DM 3. Anemia 4. Sec HPTH on calcitriol, phoslo 5. Anemia of CKD- Hb around 9, on aranesp 100  [name] Moselle  MD [facility] [phone] pgr    [phone] cell 12/10/2011, 11:19 AM

## 2011-12-11 ENCOUNTER — Inpatient Hospital Stay (HOSPITAL_COMMUNITY): Payer: Medicare Other

## 2011-12-11 LAB — RENAL FUNCTION PANEL
CO2: 27 mEq/L (ref 19–32)
Chloride: 99 mEq/L (ref 96–112)
GFR calc Af Amer: 17 mL/min — ABNORMAL LOW (ref >90)
GFR calc non Af Amer: 15 mL/min — ABNORMAL LOW (ref >90)
Sodium: 137 mEq/L (ref 135–145)

## 2011-12-11 LAB — CBC
Platelets: 187 10*3/uL (ref 150–400)
RBC: 3.13 MIL/uL — ABNORMAL LOW (ref 3.87–5.11)
WBC: 8.5 10*3/uL (ref 4.0–10.5)

## 2011-12-11 LAB — GLUCOSE, CAPILLARY
Glucose-Capillary: 77 mg/dL (ref 70–99)
Glucose-Capillary: 63 mg/dL — ABNORMAL LOW (ref 70–99)

## 2011-12-11 MED ORDER — GLUCOSE 40 % PO GEL
ORAL | Status: AC
  Filled 2011-12-11: qty 1

## 2011-12-11 MED ORDER — PARICALCITOL 5 MCG/ML IV SOLN
INTRAVENOUS | Status: AC
  Administered 2011-12-11: 2 ug via INTRAVENOUS
  Filled 2011-12-11: qty 1

## 2011-12-11 MED ORDER — GABAPENTIN 100 MG PO CAPS
100.0000 mg | ORAL_CAPSULE | Freq: Three times a day (TID) | ORAL | Status: DC | PRN
  Administered 2011-12-11: 100 mg via ORAL
  Filled 2011-12-11 (×2): qty 1

## 2011-12-11 NOTE — Progress Notes (Signed)
CBG: 63  Treatment: 15 GM carbohydrate snack  Symptoms: None  Follow-up CBG: Time: 1245 CBG Result: 107  Possible Reasons for Event: Inadequate meal intake  Comments/MD notified: Dr. John Giovanni, [place] Favor

## 2011-12-11 NOTE — Progress Notes (Signed)
    Subjective:  Patient feels fatigued. She denies chest pain or dyspnea.  Objective:  Vital Signs in the last 24 hours: Temp:  [97 F (36.1 C)-98.6 F (37 C)] 97 F (36.1 C) (03/18 1056) Pulse Rate:  [59-71] 70  (03/18 1056) Resp:  [13-23] 13  (03/18 1056) BP: (80-126)/(43-69) 126/60 mmHg (03/18 1056) SpO2:  [95 %-98 %] 96 % (03/18 1056) Weight:  [73 kg (160 lb 15 oz)-75.841 kg (167 lb 3.2 oz)] 73 kg (160 lb 15 oz) (03/18 1056)  Intake/Output from previous day: 03/17 0701 - 03/18 0700 In: 1240 [P.O.:1240] Out: 0   Physical Exam: Pt is alert and oriented, NAD HEENT: normal Neck: JVP - normal Lungs: CTA bilaterally CV: RRR without murmur or gallop Abd: soft, NT, Positive BS, no hepatomegaly Ext: no C/C/E, distal pulses intact and equal Skin: warm/dry no rash   Lab Results:  Basename 12/11/11 0759 12/09/11 0948  WBC 8.5 6.3  HGB 9.4* 8.9*  PLT 187 138*    Basename 12/11/11 0800 12/09/11 0948  NA 137 136  K 3.6 3.7  CL 99 98  CO2 27 25  GLUCOSE 74 201*  BUN 33* 64*  CREATININE 3.12* 3.96*   No results found for this basename: TROPONINI:2,CK,MB:2 in the last 72 hours  Assessment/Plan:  1. Acute/chronic systolic/Diastolic CHF  2. ESRD newly started HD  3. CAD, no angina  The patient's volume status is improved with hemodialysis. Her pulmonary congestion is also improved. Her breathing is much better. She is awaiting outpatient dialysis to be arranged and she will be ready for discharge. Will discontinue isosorbide as her blood pressure has been running well since dialysis was started.  Tonny Bollman, M.D. 12/11/2011, 2:13 PM

## 2011-12-11 NOTE — Progress Notes (Signed)
I have seen and examined this patient and agree with the assessment/plan as outlined above by [facility] PA student. Agree with above A and P- continue HD on MWF schedule for now as we await placement to an out-patient HD unit - likely in the next 24-48hr ([facility] unit) Clinically feeling better with less dyspnea and improving appetite. Some LE edema on exam and minimal rales. Kennedie Pardoe K.,MD 12/11/2011 9:39 AM

## 2011-12-11 NOTE — Progress Notes (Signed)
PA Student Daily Progress Note [facility] Subjective: Pt states she is not feeling quite as good as she did yesterday. She states she had a hypoglycemic event last night that was corrected with oral glucose and that she did not sleep well because of this. She states some improvement in her SOB and states she has not required as much supplemental O2. She denies any chest pain. She states her cough is still present but that it is improving.  Objective:  Filed Vitals:   12/11/11 0530 12/11/11 0715 12/11/11 0731 12/11/11 0734  BP: 122/53 117/66 118/60 111/64  Pulse: 68 71 69 64  Temp: 98 F (36.7 C) 97.3 F (36.3 C)    TempSrc: Oral Oral    Resp: 18 17 23 18   Height:      Weight:  75.5 kg (166 lb 7.2 oz)    SpO2: 96% 95%     Physical Exam: NWG:NFAOZ and alert, currently on HD CVS:RRR  Resp: Rhonchi bilaterally that improves with cough, some faint rales noted in the bases HYQ:MVHQ, non-tender, +BS Ext: 1+ edema LE's  NEURO: Oriented X 3, I can not appreciate any asterixis  Rt perm cath  Assessment/Plan: 1. CKD 5, new ESRD- Currently on HD, tolerating well. Pre-dialysis wt 75.5 kg decreased from 75.8kg yesterday. Still vol overloaded, +1 edema in lower extremities and bibasilar rales still present. SOB has improved.  Has new TDC and recently placed RUA BVT fistula.   2. Acute/Chronic, Systolic/ Diastolic CHF-  EF- 46-96% per echo on 11-16-11. Cards following 3. DM  4. Anemia- HGB improved to 9.4 from 8.9 on 3-16. Pt on Aranesp 5. Sec HPTH- on calcitriol, phoslo    Labs: Basic Metabolic Panel:  Lab 12/09/11 2952 12/08/11 1113 12/07/11 0505 12/06/11 0640  NA 136 131* 131* --  K 3.7 3.4* 3.6 --  CL 98 90* 91* --  CO2 25 24 22  --  GLUCOSE 201* 205* 82 --  BUN 64* 98* 99* --  CREATININE 3.96* 5.07* 5.27* --  CALCIUM 9.2 8.9 9.6 --  ALB -- -- -- --  PHOS -- -- 6.2* 6.6*   Liver Function Tests:  Lab 12/09/11 0948 12/07/11 0505 12/06/11 0640 12/05/11 1943    AST 159* -- 225* 193*  ALT 134* -- 137* 97*  ALKPHOS 68 -- 84 89  BILITOT 0.5 -- 0.8 1.4*  PROT 6.4 -- 6.9 7.1  ALBUMIN 3.4* 3.6 3.8 --    CBC:  Lab 12/11/11 0759 12/09/11 0948 12/06/11 0640 12/05/11 1943 12/05/11 1108  WBC 8.5 6.3 7.0 -- --  NEUTROABS -- -- -- -- 6.9  HGB 9.4* 8.9* 9.2* -- --  HCT 29.7* 28.3* 29.0* -- --  MCV 94.9 96.6 94.5 93.3 94.0  PLT 187 138* 142* -- --   CBG:  Lab 12/11/11 0121 12/11/11 0058 12/10/11 2040 12/10/11 1710 12/10/11 1159  GLUCAP 77 52* 106* 154* 177*   Micro Results: Recent Results (from the past 240 hour(s))  MRSA PCR SCREENING     Status: Normal   Collection Time   12/05/11  7:12 PM      Component Value Range Status Comment   MRSA by PCR NEGATIVE  NEGATIVE  Final    Studies/Results: No results found. Medications: Scheduled Meds:   . aspirin EC  81 mg Oral Daily  . calcium acetate  667 mg Oral TID WC  . clopidogrel  75 mg Oral Daily  . darbepoetin (ARANESP) injection - DIALYSIS  100 mcg Intravenous Q Fri-HD  .  dextrose      . insulin aspart  0-20 Units Subcutaneous TID WC  . insulin aspart  0-5 Units Subcutaneous QHS  . insulin aspart  5 Units Subcutaneous TID WC  . insulin glargine  30 Units Subcutaneous BID  . isosorbide mononitrate  30 mg Oral Daily  . multivitamin  1 tablet Oral QHS  . pantoprazole  40 mg Oral Daily  . paricalcitol  2 mcg Intravenous 3 times weekly  . sodium chloride  3 mL Intravenous Q12H   Continuous Infusions:  PRN Meds:.sodium chloride, guaiFENesin-dextromethorphan, heparin, ondansetron (ZOFRAN) IV, ondansetron, sodium chloride   This is a Psychologist, occupational Note.  The care of the patient was discussed with Dr.Patel and the assessment and plan formulated with their assistance.  Please see their attached note for official documentation of the daily encounter.  Morrell Riddle PA-S2 12/11/2011, 8:20 AM

## 2011-12-11 NOTE — Progress Notes (Signed)
   CARE MANAGEMENT NOTE 12/11/2011  Patient:  Paolillo,Nema S   Account Number:  [id number]  Date Initiated:  12/11/2011  Documentation initiated by:  GRAVES-BIGELOW,Malon Branton  Subjective/Objective Assessment:   Pt with hx of severe CAD, CKD 4-5, DM, who presented to ED today from PCP's office 2/2 a 2 day h/o orthopnea and chest pain. Pt is from home with husband. In process of being Clipped.     Action/Plan:   Per pt she will not need HH services at this time. No needs assessed by CM.   Anticipated DC Date:  12/13/2011   Anticipated DC Plan:  HOME/SELF CARE      DC Planning Services  CM consult      Choice offered to / List presented to:             Status of service:  Completed, signed off Medicare Important Message given?   (If response is "NO", the following Medicare IM given date fields will be blank) Date Medicare IM given:   Date Additional Medicare IM given:    Discharge Disposition:  HOME/SELF CARE  Per UR Regulation:    If discussed at Long Length of Stay Meetings, dates discussed:    Comments:

## 2011-12-12 ENCOUNTER — Encounter (HOSPITAL_COMMUNITY): Payer: Self-pay | Admitting: Vascular Surgery

## 2011-12-12 ENCOUNTER — Inpatient Hospital Stay (HOSPITAL_COMMUNITY): Payer: Medicare Other

## 2011-12-12 LAB — GLUCOSE, CAPILLARY
Glucose-Capillary: 49 mg/dL — ABNORMAL LOW (ref 70–99)
Glucose-Capillary: 89 mg/dL (ref 70–99)
Glucose-Capillary: 118 mg/dL — ABNORMAL HIGH (ref 70–99)
Glucose-Capillary: 170 mg/dL — ABNORMAL HIGH (ref 70–99)

## 2011-12-12 LAB — RENAL FUNCTION PANEL
Albumin: 3.2 g/dL — ABNORMAL LOW (ref 3.5–5.2)
BUN: 15 mg/dL (ref 6–23)
Creatinine, Ser: 2.48 mg/dL — ABNORMAL HIGH (ref 0.50–1.10)
Phosphorus: 2.9 mg/dL (ref 2.3–4.6)

## 2011-12-12 MED ORDER — BISACODYL 10 MG RE SUPP: 10.0000 mg | Freq: Every day | RECTAL | Status: DC | PRN

## 2011-12-12 MED ORDER — HEPARIN SODIUM (PORCINE) 1000 UNIT/ML DIALYSIS
40.0000 [IU]/kg | INTRAMUSCULAR | Status: DC | PRN
  Administered 2011-12-12: 2900 [IU] via INTRAVENOUS_CENTRAL
  Filled 2011-12-12: qty 3

## 2011-12-12 MED ORDER — PARICALCITOL 5 MCG/ML IV SOLN
INTRAVENOUS | Status: AC
  Filled 2011-12-12: qty 1

## 2011-12-12 MED ORDER — INSULIN GLARGINE 100 UNIT/ML ~~LOC~~ SOLN
15.0000 [IU] | Freq: Two times a day (BID) | SUBCUTANEOUS | Status: DC
  Administered 2011-12-12 – 2011-12-13 (×3): 15 [IU] via SUBCUTANEOUS

## 2011-12-12 MED ORDER — DEXTROSE 50 % IV SOLN
INTRAVENOUS | Status: AC
  Administered 2011-12-12: 20 mL
  Filled 2011-12-12: qty 50

## 2011-12-12 NOTE — Progress Notes (Signed)
PA Student Daily Progress Note [facility] Subjective: Pt states she is feeling well. She denies any SOB or CP. She did not require O2 at all yesterday after her dialysis tx. Objective:  Filed Vitals:   12/11/11 1056 12/11/11 1200 12/11/11 2100 12/12/11 0531  BP: 126/60 118/52 120/56 122/66  Pulse: 70 66 62 65  Temp: 97 F (36.1 C) 98.1 F (36.7 C) 98.2 F (36.8 C) 98.9 F (37.2 C)  TempSrc: Oral Oral Oral Oral  Resp: 13 20 18 18   Height:   5\' 6"  (1.676 m)   Weight: 73 kg (160 lb 15 oz)  73.1 kg (161 lb 2.5 oz)   SpO2: 96% 99% 99% 93%   Physical Exam: General- Pt is alert and oriented, NAD  HEENT: normal, no JVP noted Lungs: CTA bilaterally  CV: RRR without murmur or gallop  Abd: soft, NT, Positive BS, no hepatomegaly  Ext: trace pedal edema noted, distal pulses intact and equal  Skin: warm/dry no rash  Assessment/Plan: 1. CKD 5, new ESRD- Dialysis yesterday. On MWF schedule. Pre-dialysis wt 75.5, post dialysis wt 73.1. I&O yesterday O= 2394, net= -1854 Volume status significantly inproved. Pt denies any SOB and has not required supplemental O2.  No acute electrolyte abnormalities. Waiting placement at an out-patient HD unit. Has new TDC and recently placed RUA BVT fistula.  2. Acute/Chronic, Systolic/ Diastolic CHF- EF- 45-50% per echo on 11-16-11. Cards following  3. DM  4. Anemia- HGB improved to 9.4 yesterday from 8.9 on 3-16. Pt on Aranesp . No CBC for review this Am.  5. Sec HPTH- on calcitriol, phoslo   Labs: Basic Metabolic Panel:  Lab 12/12/11 1610 12/11/11 0800 12/09/11 0948 12/07/11 0505  NA 136 137 136 --  K 4.5 3.6 3.7 --  CL 99 99 98 --  CO2 26 27 25  --  GLUCOSE 66* 74 201* --  BUN 15 33* 64* --  CREATININE 2.48* 3.12* 3.96* --  CALCIUM 9.5 9.4 9.2 --  ALB -- -- -- --  PHOS 2.9 2.4 -- 6.2*   Liver Function Tests:  Lab 12/12/11 0500 12/11/11 0800 12/09/11 0948 12/06/11 0640 12/05/11 1943  AST -- -- 159* 225* 193*  ALT -- -- 134*  137* 97*  ALKPHOS -- -- 68 84 89  BILITOT -- -- 0.5 0.8 1.4*  PROT -- -- 6.4 6.9 7.1  ALBUMIN 3.2* 3.1* 3.4* -- --   No results found for this basename: LIPASE:3,AMYLASE:3 in the last 168 hours No results found for this basename: AMMONIA:3 in the last 168 hours INR: @resultsinr3 @ CBC:  Lab 12/11/11 0759 12/09/11 0948 12/06/11 0640 12/05/11 1943 12/05/11 1108  WBC 8.5 6.3 7.0 -- --  NEUTROABS -- -- -- -- 6.9  HGB 9.4* 8.9* 9.2* -- --  HCT 29.7* 28.3* 29.0* -- --  MCV 94.9 96.6 94.5 93.3 94.0  PLT 187 138* 142* -- --   Blood Culture No results found for this basename: sdes, specrequest, cult, reptstatus    Cardiac Enzymes: No results found for this basename: CKTOTAL:5,CKMB:5,CKMBINDEX:5,TROPONINI:5 in the last 168 hours CBG:  Lab 12/11/11 2059 12/11/11 1654 12/11/11 1241 12/11/11 1151 12/11/11 0121  GLUCAP 77 107* 107* 63* 77   Iron Studies: No results found for this basename: IRON,TIBC,TRANSFERRIN,FERRITIN in the last 72 hours  Micro Results: Recent Results (from the past 240 hour(s))  MRSA PCR SCREENING     Status: Normal   Collection Time   12/05/11  7:12 PM      Component Value  Range Status Comment   MRSA by PCR NEGATIVE  NEGATIVE  Final    Studies/Results: No results found. Medications: Scheduled Meds:   . aspirin EC  81 mg Oral Daily  . calcium acetate  667 mg Oral TID WC  . clopidogrel  75 mg Oral Daily  . darbepoetin (ARANESP) injection - DIALYSIS  100 mcg Intravenous Q Fri-HD  . dextrose      . insulin aspart  0-20 Units Subcutaneous TID WC  . insulin aspart  0-5 Units Subcutaneous QHS  . insulin aspart  5 Units Subcutaneous TID WC  . insulin glargine  30 Units Subcutaneous BID  . multivitamin  1 tablet Oral QHS  . pantoprazole  40 mg Oral Daily  . paricalcitol  2 mcg Intravenous 3 times weekly  . sodium chloride  3 mL Intravenous Q12H  . DISCONTD: isosorbide mononitrate  30 mg Oral Daily   Continuous Infusions:  PRN Meds:.sodium chloride,  gabapentin, guaiFENesin-dextromethorphan, heparin, ondansetron (ZOFRAN) IV, ondansetron, sodium chloride   This is a Psychologist, occupational Note.  The care of the patient was discussed with Dr. Allena Katz and the assessment and plan formulated with their assistance.  Please see their attached note for official documentation of the daily encounter.  Morrell Riddle PA-S2 12/12/2011, 7:35 AM  I have seen and examined this patient and agree with the assessment/plan as outlined above by [facility] PA student. The patient has a spot for dialysis at [facility] on a Tuesday, Thursday, saturday 1st shift schedule- plan for HD today (shorter) in anticipation of DC tomorrow.  Lorelei Heikkila K.,MD 12/12/2011 9:52 AM

## 2011-12-12 NOTE — Progress Notes (Signed)
Pt off unit

## 2011-12-12 NOTE — Progress Notes (Addendum)
    Subjective:  Feels tired but no specific complaints. No chest pain or dyspnea. Slept well without O2 last night.  Objective:  Vital Signs in the last 24 hours: Temp:  [97 F (36.1 C)-98.9 F (37.2 C)] 98.7 F (37.1 C) (03/19 0900) Pulse Rate:  [62-70] 69  (03/19 0900) Resp:  [13-21] 21  (03/19 0900) BP: (118-133)/(52-66) 133/60 mmHg (03/19 0900) SpO2:  [93 %-99 %] 99 % (03/19 0900) Weight:  [73 kg (160 lb 15 oz)-73.1 kg (161 lb 2.5 oz)] 73.1 kg (161 lb 2.5 oz) (03/18 2100)  Intake/Output from previous day: 03/18 0701 - 03/19 0700 In: 360 [P.O.:360] Out: 2394   Physical Exam: Pt is alert and oriented, NAD HEENT: normal Neck: JVP - normal Lungs: CTA bilaterally Chest: ecchymoses at right permacath site no firm hematoma CV: RRR without murmur or gallop Abd: soft, NT, Positive BS, no hepatomegaly Ext: no C/C/E Skin: warm/dry no rash   Lab Results:  [facility] 12/11/11 0759  WBC 8.5  HGB 9.4*  PLT 187    Basename 12/12/11 0500 12/11/11 0800  NA 136 137  K 4.5 3.6  CL 99 99  CO2 26 27  GLUCOSE 66* 74  BUN 15 33*  CREATININE 2.48* 3.12*   No results found for this basename: TROPONINI:2,CK,MB:2 in the last 72 hours  Assessment/Plan:  1. Acute/chronic systolic/Diastolic CHF  2. ESRD newly started HD  3. CAD, no angina  Pt stable from cardiac perspective. All antihypertensive/antianginals discontinued to avoid hypotension with dialysis (no beta blocker or ACE for same reason). Pt seems to be doing fine with this. Appreciate nephrology care. Pt has outpatient hemodialysis set up and will be ready for discharge tomorrow.  Tonny Bollman, M.D. 12/12/2011, 10:40 AM

## 2011-12-12 NOTE — Progress Notes (Addendum)
CBG: 46  Treatment: D50 IV 25 mL  Symptoms: None  Follow-up CBG: Time:0826 CBG Result:89  Possible Reasons for Event: Unknown  Comments/MD notified:Barrett, PA/ New orders given. Lantus decreased and meal coverage novolog DC'd for now.     [place], Rosanna Randy

## 2011-12-13 DIAGNOSIS — I5043 Acute on chronic combined systolic (congestive) and diastolic (congestive) heart failure: Principal | ICD-10-CM

## 2011-12-13 LAB — GLUCOSE, CAPILLARY
Glucose-Capillary: 89 mg/dL (ref 70–99)
Glucose-Capillary: 98 mg/dL (ref 70–99)

## 2011-12-13 MED ORDER — INSULIN ASPART 100 UNIT/ML ~~LOC~~ SOLN: 0.0000 [IU] | Freq: Every day | SUBCUTANEOUS | Status: DC

## 2011-12-13 MED ORDER — INSULIN GLARGINE 100 UNIT/ML ~~LOC~~ SOLN: 15.0000 [IU] | Freq: Two times a day (BID) | SUBCUTANEOUS | Status: DC

## 2011-12-13 MED ORDER — CALCIUM ACETATE 667 MG PO CAPS: 667.0000 mg | ORAL_CAPSULE | Freq: Three times a day (TID) | ORAL | Status: DC

## 2011-12-13 MED ORDER — GLUCOSE 40 % PO GEL
ORAL | Status: AC
  Filled 2011-12-13: qty 1

## 2011-12-13 NOTE — Progress Notes (Signed)
12-13-11 1610 Tomi Bamberger, RN,BSN [phone] Pt was discussed in long length of stay meeting.

## 2011-12-13 NOTE — Progress Notes (Signed)
Glycemic Control Recommendations     Patient hypoglycemic this am: 54 mg/dL.  - Consider decreasing Lantus again:  Lantus 12 BID

## 2011-12-13 NOTE — Progress Notes (Signed)
12/13/2011 2:28 PM  Wasted 11cc of morphine PCA into sink at nurses station.  Roxanne Mins, RN, witnessed waste.  Sadhana Blase

## 2011-12-13 NOTE — Discharge Summary (Signed)
Discharge Summary   Patient ID: [name],  MRN: [id number], DOB/AGE: 06/06/1946 66 y.o.  Admit date: 12/05/2011 Discharge date: 12/13/2011  Discharge Diagnoses Principal Problem:  *Chronic combined systolic and diastolic heart failure Active Problems:  DIABETES MELLITUS, TYPE II  HYPERLIPIDEMIA  PERIPHERAL NEUROPATHY  HYPERTENSION, UNSPECIFIED  CAD  PERIPHERAL VASCULAR DISEASE  GERD  Chronic kidney disease (CKD), stage V   Allergies Allergies  Allergen Reactions  . Codeine Other (See Comments)    unknown    Diagnostic Studies/Procedures  RIGHT INTERNAL JUGULAR VEIN TUNNELED DIALYSIS CATHETER PLACEMENT- 12/07/11  PROCEDURE:  1. Right internal jugular vein tunneled dialysis catheter placement  2. Right internal jugular vein cannulation under ultrasound guidance  PRE-OPERATIVE DIAGNOSIS: end-stage renal failure  POST-OPERATIVE DIAGNOSIS: same as above  SURGEON: Nilda Simmer, MD  ANESTHESIA: local  ESTIMATED BLOOD LOSS: minimal  FINDING(S):  1. Tips of the catheter in the right atrium on fluoroscopy  2. No obvious pneumothorax on fluoroscopy  SPECIMEN(S): none  INDICATIONS:  [name] is a 66 y.o. female who presents with end stage renal disease. The patient presents for tunneled dialysis catheter placement. The patient is aware the risks of tunneled dialysis catheter placement include but are not limited to: bleeding, infection, central venous injury, pneumothorax, possible venous stenosis, possible malpositioning in the venous system, and possible infections related to long-term catheter presence. The patient was aware of these risks and agreed to proceed.  DESCRIPTION:  After written full informed consent was obtained from the patient, the patient was taken back to the operating room. Prior to induction, the patient was given IV antibiotics. After obtaining adequate sedation, the patient was prepped and draped in the standard fashion for a chest or neck  tunneled dialysis catheter placement. I anesthesized the neck cannulation site with local anesthetic, then under ultrasound guidance, the right internal jugular vein vein was cannulated with the 18 gauge needle. A J-wire was then placed down in the IVC under fluroscopic guidance. The wire was then secured in place with a clamp to the drapes. The cannulation site, the catheter exit site, and tract for the subcutaneous tunnel were then anesthesized with a total of 20 cc of a 1:1 mixture of 0.5% Marcaine without epinepherine and 1% Lidocaine with epinepherine. I then made stab incisions are the neck and exit sites. I dissected from the chest to the neck and dilated the subcutaneous tunnel with a plastic dilator. The wire was then unclamped and I removed the needle. The skin tract and venotomy was dilated serially with dilators. Finally, the dilator-sheath was placed under fluroscopic guidance into the superior vena cava. The dilator and wire were removed. A 23 cm Diatek catheter was placed under fluoroscopic guidance down into the right atrium. The sheath was broken and peeled away while holding the catheter cuff at the level of the skin. The back end of this catheter was transected, revealing the two lumens of this catheter. The ports were docked onto these two lumens. The catheter hub was then screwed into place. Each port was tested by aspirating and flushing. No resistance was noted. Each port was then thoroughly flushed with heparinized saline. The catheter was secured in placed with two interrupted stitches of 3-0 Nylon tied to the catheter. The neck incision was closed with a U-stitch of 4-0 Monocryl. The neck and chest incision were cleaned and sterile bandages applied. Each port was then loaded with concentrated heparin (1000 Units/mL) at the manufacturer recommended volumes to each port. Sterile caps  were applied to each port. On completion fluoroscopy, the tips of the catheter were in the right atrium, and  there was no evidence of pneumothorax.  COMPLICATIONS: none  CONDITION: stable  History of Present Illness  [name] is a 66yo female with PMHx significant for ESRD disease (nearing HD, AVF placed 11/23/11, placed on HD this admission), CAD, mixed CHF, uncontrolled type 2 DM and PVD who was admitted to [facility] hospital with dyspnea.   She endorsed baseline SOB, however intermittently worsened and improved. She endorsed orthopnea, and employed the assistance of a wedge pillow to improve this. She endorsed increased DOE (able to perform household chores with use of walker without SOB), PND and increased LE swelling. She could not volunteer info regarding weight changes due to not weighing herself consistently. She denied chest pain and palpitations. She reported a recent URI, followed-up with her PCP, given cough syrup and an antiobiotic, the name of which she could not recall. She endorsed nonproductive cough and head congestion. She denied sore throat, fevers, chills, but did endorse generalized malaise and anorexia.   There was a mention of decreased urine output after being placed on metolazone per her last hospital discharge. In the ED, Cr was found to be at baseline, pBNP elevated, CXR revealed interstitial changes without disease. CBG elevated at 418. No acute changes on EKG from baseline changes.   Hospital Course   She was admitted to stepdown due to a concern for borderline DKA. She was continued on prior cardiac meds with the exception of Lasix/Metolazone which was to be dosed by nephrology. They were consulted and assisted in the management of her CKD and hyperglycemia. Upon evaluation, her symptoms were suspected to be secondary to uremia. Internal medicine was consulted due to her hyperglycemia and managed her insulin dosing while inpatient. Her symptoms improved with management of blood glucose. There was concern for potential DKA with + AG, this improved with serial BMETs and insulin  adjustments. Hyponatremia was noted the following day, and suspected to be pseudohyponatremia due to hyperglycemia vs hypervolemic hyponatremia. Her dyspnea improved and she was found to have some fluid overload on exam, but clear lungs. Cardiac meds were continued.   She was agreeable to HD as an option presented by nephrology. Vascular was consulted to place a perm cath. She was informed, consented and underwent the above procedure. She tolerated this well without complications. She remained stable from a cardiac standpoint. Her fluid status and symptoms improved with HD. Nephrology continued to manage this, her electrolytes and set up outpatient HD. She initially had little appetite, but this improved throughout the admission. Her dyspnea further improved and returned to baseline. Her CBGs fluctuated and she become hypoglycemic on a few occasions warranting down-titration of her insulin. The extent of the last two days of her admission consisted of setting up outpatient HD which was accomplished. She was found to have labile BPs and many cardiac meds were discontinued including hydralazine, imdur, toprol.   Today, she is stable, at baseline and will be discharged home. She will resume ASA/Plavix. She will continue PhosLo and her inpatient insulin dosing in the short-term after discharge. Her CBGs had been very poorly controlled upon initial presentation, and even alerting a critical value after her previous discharge. This is to be managed by her PCP. She will follow-up in our office in 2-3 weeks and with her PCP. She was advised to schedule an appointment with nephrology to manage her chronic kidney disease. She has been set  up with [facility] on a T/Th/Sa schedule. This information has been clearly outlined on the discharge AVS.   Discharge Vitals:  Blood pressure 129/53, pulse 73, temperature 98.4 F (36.9 C), temperature source Oral, resp. rate 20, height 5\' 6"  (1.676 m), weight 72.4 kg  (159 lb 9.8 oz), SpO2 100.00%.   Weight change: -1.7 kg (-3 lb 12 oz)  Labs: Recent Labs  [facility] 12/11/11 0759   WBC 8.5   HGB 9.4*   HCT 29.7*   MCV 94.9   PLT 187    Lab 12/12/11 0500 12/11/11 0800 12/09/11 0948  NA 136 137 136  K 4.5 3.6 3.7  CL 99 99 98  CO2 26 27 25   BUN 15 33* 64*  CREATININE 2.48* 3.12* 3.96*  CALCIUM 9.5 9.4 9.2  PROT -- -- 6.4  BILITOT -- -- 0.5  ALKPHOS -- -- 68  ALT -- -- 134*  AST -- -- 159*  AMYLASE -- -- --  LIPASE -- -- --  GLUCOSE 66* 74 201*    Disposition:  Discharge Orders    Future Appointments: Provider: Department: Dept Phone: Center:   12/26/2011 8:30 AM Tonny Bollman, MD [facility] [phone] [facility]   12/26/2011 9:20 AM [facility] [place] [phone] [facility]   01/03/2012 1:45 PM Chuck Hint, MD [facility] [phone] [facility]   12/02/2012 9:30 AM Sherrie George, MD [facility] [phone] None     Follow-up Information    Follow up with Rudi Heap, MD. (As Scheduled)       Follow up with Tonny Bollman, MD on 12/26/2011. (8:30 AM)    Contact information:   [address], [address] [place] [address] [phone]       Follow up with Cecille Aver, MD. (PLEASE SCHEDULE FOLLOW-UP WITH NEPHROLOGY TO MANAGE YOUR CHRONIC KIDNEY DISEASE IN 1-2 WEEKS. )    Contact information:   [address] [place] [address] [phone]          Discharge Medications:  Medication List  As of 12/13/2011  1:01 PM   START taking these medications         calcium acetate 667 MG capsule   Commonly known as: PHOSLO   Take 1 capsule (667 mg total) by mouth 3 (three) times daily with meals.      * insulin aspart 100 UNIT/ML injection   Commonly known as: novoLOG   Inject 0-5 Units into the skin at bedtime. Adjust according to sliding scale dosing.     * Notice: This list has 1 medication(s) that are the same as other  medications prescribed for you. Read the directions carefully, and ask your doctor or other care provider to review them with you.       CHANGE how you take these medications         insulin glargine 100 UNIT/ML injection   Commonly known as: LANTUS   Inject 15 Units into the skin 2 (two) times daily. Inject 15 units in the am and 15 units in the pm daily   What changed: - dose - doctor's instructions         CONTINUE taking these medications         ASPIRIN LOW DOSE 81 MG EC tablet   Generic drug: aspirin      calcitRIOL 0.25 MCG capsule   Commonly known as: ROCALTROL      gabapentin 100 MG tablet  Commonly known as: NEURONTIN      * glucose blood test strip      * Glucose Blood Kit      * insulin aspart 100 UNIT/ML injection   Commonly known as: novoLOG      Lancets Misc      pantoprazole 40 MG tablet   Commonly known as: PROTONIX      PLAVIX 75 MG tablet   Generic drug: clopidogrel      Prodigy Autocode Blood Glucose W/DEVICE Kit     * Notice: This list has 3 medication(s) that are the same as other medications prescribed for you. Read the directions carefully, and ask your doctor or other care provider to review them with you.       STOP taking these medications         furosemide 80 MG tablet      hydrALAZINE 10 MG tablet      isosorbide mononitrate 30 MG 24 hr tablet      metolazone 2.5 MG tablet      metoprolol succinate 25 MG 24 hr tablet      Scholls For Her Open Shoe Misc          Where to get your medications    These are the prescriptions that you need to pick up. We sent them to a specific pharmacy, so you will need to go there to get them.   [facility] - [place] - [address]    [address] [place] [address]    Phone: [phone]        calcium acetate 667 MG capsule   insulin aspart 100 UNIT/ML injection   insulin glargine 100 UNIT/ML injection           Outstanding Labs/Studies:  NONE  Duration of Discharge Encounter: Greater than 30 minutes including physician time.  Signed, R. Hurman Horn, PA-C 12/13/2011, 1:01 PM

## 2011-12-13 NOTE — Discharge Instructions (Signed)
PLEASE REMEMBER TO BRING ALL OF YOUR MEDICATIONS TO EACH OF YOUR FOLLOW-UP OFFICE VISITS.  PLEASE ATTEND [facility] FOR DIALYSIS ON Tuesday, Thursday, Saturday SCHEDULE.   PLEASE MONITOR YOUR BLOOD SUGAR AND CAREFULLY TAKE YOUR INSULIN AS PRESCRIBED.

## 2011-12-13 NOTE — Progress Notes (Signed)
    Subjective:  Muscle cramping last night. No chest pain or dyspnea. No other complaints.  Objective:  Vital Signs in the last 24 hours: Temp:  [98.1 F (36.7 C)-98.7 F (37.1 C)] 98.4 F (36.9 C) (03/20 0509) Pulse Rate:  [63-83] 74  (03/20 0509) Resp:  [17-24] 18  (03/20 0509) BP: (77-133)/(49-75) 109/68 mmHg (03/20 0509) SpO2:  [98 %-100 %] 99 % (03/20 0509) Weight:  [72.4 kg (159 lb 9.8 oz)-73.8 kg (162 lb 11.2 oz)] 72.4 kg (159 lb 9.8 oz) (03/19 1615)  Intake/Output from previous day: 03/19 0701 - 03/20 0700 In: 840 [P.O.:840] Out: 1354   Physical Exam: Pt is alert and oriented, NAD HEENT: normal Neck: JVP - normal, carotids 2+= Lungs: CTA bilaterally CV: RRR without murmur or gallop Abd: soft, NT, Positive BS Ext: no C/C/E Skin: warm/dry no rash   Lab Results:  Basename 12/11/11 0759  WBC 8.5  HGB 9.4*  PLT 187    Basename 12/12/11 0500 12/11/11 0800  NA 136 137  K 4.5 3.6  CL 99 99  CO2 26 27  GLUCOSE 66* 74  BUN 15 33*  CREATININE 2.48* 3.12*   No results found for this basename: TROPONINI:2,CK,MB:2 in the last 72 hours  Assessment/Plan:  1. Acute/chronic systolic/Diastolic CHF  2. ESRD newly started HD  3. CAD, no angina  Stable from CV perspective. BP is in low/normal range since starting dialysis. Pt off of all CV meds except for ASA and Plavix. Would continue same. She is set up for outpatient HD. Will arrange follow-up with Tereso Newcomer in a few weeks in our office. Appreciate nephrology care.  Tonny Bollman, M.D. 12/13/2011, 7:10 AM

## 2011-12-15 NOTE — Discharge Summary (Signed)
Agree as outlined see my progress note this same date.  Tonny Bollman 12/15/2011 12:22 AM

## 2011-12-22 ENCOUNTER — Encounter (HOSPITAL_COMMUNITY): Payer: Medicare Other

## 2011-12-26 ENCOUNTER — Ambulatory Visit: Payer: Medicare Other | Admitting: Cardiovascular Disease

## 2011-12-26 ENCOUNTER — Other Ambulatory Visit: Payer: Medicare Other

## 2012-01-02 ENCOUNTER — Encounter: Payer: Self-pay | Admitting: Vascular Surgery

## 2012-01-03 ENCOUNTER — Encounter: Payer: Self-pay | Admitting: Vascular Surgery

## 2012-01-03 ENCOUNTER — Ambulatory Visit (INDEPENDENT_AMBULATORY_CARE_PROVIDER_SITE_OTHER): Payer: Medicare Other | Admitting: Vascular Surgery

## 2012-01-03 VITALS — BP 109/72 | HR 82 | Resp 18 | Ht 66.0 in | Wt 172.0 lb

## 2012-01-03 DIAGNOSIS — N186 End stage renal disease: Secondary | ICD-10-CM

## 2012-01-03 NOTE — Progress Notes (Signed)
[facility] [place]  Patient name: [name] MRN: [id number] DOB: [birth year] Sex: female  REASON FOR VISIT: follow up after right brachiocephalic AV fistula.  HPI: [name] is a 66 y.o. female who had a right basilic vein transposition on 11/21/2011. She comes in for a 6 week follow up visit. She has had no pain in the right arm in his been doing well. She's make good progress and she began dialysis. She has a functioning right IJ dietetic catheter.   REVIEW OF SYSTEMS: Arly.Keller ] denotes positive finding; [  ] denotes negative finding  CARDIOVASCULAR:  [ ]  chest pain   [ ]  dyspnea on exertion    CONSTITUTIONAL:  [ ]  fever   [ ]  chills  PHYSICAL EXAM: Filed Vitals:   01/03/12 1411  BP: 109/72  Pulse: 82  Resp: 18  Height: 5\' 6"  (1.676 m)  Weight: 172 lb (78.019 kg)   Body mass index is 27.76 kg/(m^2). GENERAL: The patient is a well-nourished female, in no acute distress. The vital signs are documented above. CARDIOVASCULAR: There is a regular rate and rhythm  PULMONARY: There is good air exchange bilaterally without wheezing or rales. Her right upper arm fistula has a good thrill. The vein feels somewhat thicker in the upper part of the arm however. The right hand is warm and well-perfused.  MEDICAL ISSUES: I plan on seeing her back in 4 weeks with the duplex of her fistula. If there are any issues identified and at that point I think it would be safe to proceed with a fistulogram. Currently I think it is too early To cannulate this basilic vein transposition. I have encouraged her to continue exercising her arm  Sydny Schnitzler S [facility] [place] Beeper: [phone]

## 2012-01-08 ENCOUNTER — Encounter: Payer: Self-pay | Admitting: Physician Assistant

## 2012-01-08 ENCOUNTER — Ambulatory Visit (INDEPENDENT_AMBULATORY_CARE_PROVIDER_SITE_OTHER): Payer: Medicare Other | Admitting: Physician Assistant

## 2012-01-08 VITALS — BP 124/70 | HR 71 | Ht 66.0 in | Wt 163.4 lb

## 2012-01-08 DIAGNOSIS — N186 End stage renal disease: Secondary | ICD-10-CM

## 2012-01-08 DIAGNOSIS — I2510 Atherosclerotic heart disease of native coronary artery without angina pectoris: Secondary | ICD-10-CM

## 2012-01-08 DIAGNOSIS — I5042 Chronic combined systolic (congestive) and diastolic (congestive) heart failure: Secondary | ICD-10-CM

## 2012-01-08 NOTE — Assessment & Plan Note (Signed)
Patient's heart failure is controlled with dialysis

## 2012-01-08 NOTE — Assessment & Plan Note (Signed)
Stable without chest pain 

## 2012-01-08 NOTE — Progress Notes (Signed)
HPI:  This is a 66 year old white female patient who has a history of severe three-vessel CAD that is not amenable to PCI or bypass surgery (cardiac catheterization 12/06) and is treated medically.  She also has peripheral arterial disease, status post left carotid endarterectomy, abnormal ABIs and recent history of left foot ulcer.  She has been followed by Dr. Darrick Penna.  She was admitted 2/20-2/27 With an NSTEMI in the setting of acute on chronic combined systolic and diastolic congestive heart failure and acute on chronic renal failure with known 3 vessel CAD.  No further ischemic workup was recommended due to her known severe disease in no options for intervention.  Plus, with her chronic kidney disease she was felt to be high risk for contrast-induced nephropathy and progression to end-stage renal disease.  Her creatinine increased with diuresis.  She was seen by nephrology.  She had an AV fistula placed for access.  Echocardiogram 11/16/11: Mild LVH, EF 45-50%, mild RAE.    The patient was in the ER 12/13/11 with fluid overload. She finally agreed to dialysis and is feeling much better. She has more energy, no longer gets short of breath and feels much better. Today she denies any cardiac complaints.  Allergies  Allergen Reactions  . Codeine Other (See Comments)    unknown    Current Outpatient Prescriptions on File Prior to Visit  Medication Sig Dispense Refill  . aspirin (ASPIRIN LOW DOSE) 81 MG EC tablet Take 81 mg by mouth daily.        . Blood Glucose Monitoring Suppl (PRODIGY AUTOCODE BLOOD GLUCOSE) W/DEVICE KIT by Does not apply route 3 (three) times daily.        . calcitRIOL (ROCALTROL) 0.25 MCG capsule Take 0.25 mcg by mouth daily.        . calcium acetate (PHOSLO) 667 MG capsule Take 1 capsule (667 mg total) by mouth 3 (three) times daily with meals.  90 capsule  1  . clopidogrel (PLAVIX) 75 MG tablet Take 75 mg by mouth daily.        Marland Kitchen gabapentin (NEURONTIN) 100 MG tablet Take 100 mg  by mouth 3 (three) times daily.        . Glucose Blood KIT by In Vitro route.        Marland Kitchen glucose blood test strip 1 each by Other route 3 (three) times daily. Use as instructed       . insulin aspart (NOVOLOG) 100 UNIT/ML injection Inject 4-12 Units into the skin 3 (three) times daily before meals. Sliding scale        . insulin aspart (NOVOLOG) 100 UNIT/ML injection Inject 0-5 Units into the skin at bedtime. Adjust according to sliding scale dosing.  1 vial  1  . insulin glargine (LANTUS) 100 UNIT/ML injection Inject 15 Units into the skin 2 (two) times daily. Inject 15 units in the am and 15 units in the pm daily  10 mL  3  . Lancets MISC by Does not apply route 3 (three) times daily.        . pantoprazole (PROTONIX) 40 MG tablet Take 40 mg by mouth daily.       Marland Kitchen DISCONTD: furosemide (LASIX) 80 MG tablet Take 160 mg by mouth 2 (two) times daily.      Marland Kitchen DISCONTD: hydrALAZINE (APRESOLINE) 10 MG tablet Take 10 mg by mouth 3 (three) times daily.      Marland Kitchen DISCONTD: isosorbide mononitrate (IMDUR) 30 MG 24 hr tablet Take 30 mg by  mouth daily.      Marland Kitchen DISCONTD: metolazone (ZAROXOLYN) 2.5 MG tablet Take 2.5 mg by mouth See admin instructions. Take 1 tablet daily on Mon, Wed, Fri (take 30 mins before AM dose of Lasix)      . DISCONTD: metoprolol succinate (TOPROL-XL) 25 MG 24 hr tablet Take 25 mg by mouth daily.      Marland Kitchen DISCONTD: nitroGLYCERIN (NITROSTAT) 0.4 MG SL tablet Place 1 tablet (0.4 mg total) under the tongue every 5 (five) minutes x 3 doses as needed for chest pain (up to 3 doses).  25 tablet  3  . DISCONTD: rosuvastatin (CRESTOR) 10 MG tablet Take 1 tablet (10 mg total) by mouth at bedtime.  30 tablet  3    Past Medical History  Diagnosis Date  . IDDM (insulin dependent diabetes mellitus)   . CAD (coronary artery disease)     a. 08/2005 - NSTEMI - Cath: Severe diffuse 3vd w/ NL EF - poor revasc candidate.;  b. NSTEMI 10/2011 treated medically   . Neuropathy   . Ulcer of foot     a. R foot -  followed by Dr. Arbie Cookey & [facility]  . Hypertension   . DJD (degenerative joint disease)   . Cataracts, bilateral 07/15/10  . Hyperlipidemia   . CKD (chronic kidney disease), stage V      s/p AV graft '09 that occluded. s/p RUE AV fistula 11/21/11  in anticipation of HD  . GERD (gastroesophageal reflux disease)   . Hiatal hernia   . Heart murmur   . Carotid artery occlusion     a. s/p left CEA 06/2007;  b. 07/2011 u/s - RICA 1-39%, LICA patent  . CHF (congestive heart failure)     Mixed, Echo 11/16/11 Hypokinesis mid/apical inferior and mid/apical inferoseptal, mild LVH, EF 45-50%, high LV filling pressure, mildly dilated RA . Felt due to worsening renal function. Not on ACEI/ARB secondary to renal insufficiency  . History of stroke   . Iron deficiency anemia     Hgb ~9 in February 2013  . History of retinal detachment     a. right eye - repair 11/2007  . History of osteomyelitis     a. Right foot  . PVD (peripheral vascular disease)     a. ABI's 06/2011: R-37, L - 84     Past Surgical History  Procedure Date  . Cataract extraction   . Arteriovenous graft placement 2009  . Carotid endarterectomy 2008  . Av fistula placement 11/21/2011    Procedure: ARTERIOVENOUS (AV) FISTULA CREATION;  Surgeon: Chuck Hint, MD;  Location: [facility] OR;  Service: Vascular;  Laterality: N/A;  Right basilic vein transposition  . Insertion of dialysis catheter 12/07/2011    Procedure: INSERTION OF DIALYSIS CATHETER;  Surgeon: Fransisco Hertz, MD;  Location: [facility] OR;  Service: Vascular;  Laterality: Right;    Family History  Problem Relation Age of Onset  . Hypertension Other   . Stroke Other   . Heart attack Other   . Cancer Other   . Heart disease Other   . Diabetes Mother   . Cancer Mother     lung - died 74  . Cirrhosis Father     died 10  . Cancer Sister     liver  . Cancer Son     lymphatic    History   Social History  . Marital Status: Widowed    Spouse Name: N/A     Number of Children:  N/A  . Years of Education: N/A   Occupational History  . Not on file.   Social History Main Topics  . Smoking status: Former Smoker    Types: Cigarettes  . Smokeless tobacco: Never Used   Comment: smoked a pack/week for about 20 yrs.  Quit about 25 y ago  . Alcohol Use: No     rare beer  . Drug Use: No  . Sexually Active: Not on file   Other Topics Concern  . Not on file   Social History Narrative   Lives at home with husband in [place].  She is retired; used to work as a Lawyer. Has 2 sons and 1 daughter, all healthy.    ROS:see history of present illness otherwise negative   PHYSICAL EXAM: Well-nournished, in no acute distress. Neck: bilateral carotid bruits,No JVD, HJR or thyroid enlargement Lungs:Decreased breath sounds but No tachypnea, clear without wheezing, rales, or rhonchi Cardiovascular: RRR, PMI not displaced,2/6 systolic murmur at the left sternal border, positive S4, and no bruit, thrill, or heave. Abdomen: BS normal. Soft without organomegaly, masses, lesions or tenderness. Extremities: trace of ankle edema,without cyanosis, clubbing . Good distal pulses bilateral SKin: Warm, no lesions or rashes  Musculoskeletal: No deformities Neuro: no focal signs  BP 124/70  Pulse 71  Ht 5\' 6"  (1.676 m)  Wt 163 lb 6.4 oz (74.118 kg)  BMI 26.37 kg/m2   ZOX:WRUEAV sinus rhythm, nonspecific ST-T wave changes poor R wave progression from old anterior infarct no acute change

## 2012-01-08 NOTE — Assessment & Plan Note (Signed)
Patient has end-stage renal disease and recently started dialysis. This is made huge difference in the way she feels. She is feeling much stronger and breathing easier

## 2012-01-08 NOTE — Patient Instructions (Signed)
Your physician wants you to follow-up in: 4 months with Dr. Cooper.  You will receive a reminder letter in the mail two months in advance. If you don't receive a letter, please call our office to schedule the follow-up appointment.  

## 2012-01-30 ENCOUNTER — Encounter: Payer: Self-pay | Admitting: Vascular Surgery

## 2012-01-31 ENCOUNTER — Encounter: Payer: Self-pay | Admitting: Thoracic Diseases

## 2012-01-31 ENCOUNTER — Encounter (INDEPENDENT_AMBULATORY_CARE_PROVIDER_SITE_OTHER): Payer: Medicare Other | Admitting: *Deleted

## 2012-01-31 ENCOUNTER — Encounter: Payer: Self-pay | Admitting: *Deleted

## 2012-01-31 ENCOUNTER — Ambulatory Visit: Payer: Medicare Other | Admitting: Vascular Surgery

## 2012-01-31 ENCOUNTER — Ambulatory Visit (INDEPENDENT_AMBULATORY_CARE_PROVIDER_SITE_OTHER): Payer: Medicare Other | Admitting: Thoracic Diseases

## 2012-01-31 VITALS — BP 142/70 | HR 68 | Temp 97.8°F | Ht 66.0 in | Wt 161.0 lb

## 2012-01-31 DIAGNOSIS — Z48812 Encounter for surgical aftercare following surgery on the circulatory system: Secondary | ICD-10-CM

## 2012-01-31 DIAGNOSIS — N186 End stage renal disease: Secondary | ICD-10-CM

## 2012-01-31 NOTE — Progress Notes (Signed)
[facility] [place]  Postoperative Visit hemodialysis access   Date of Surgery: 11/21/11 - R BVT Surgeon: [facility] Center: [place], Dr. Kathrene Bongo  HPI: [name] is a 66 y.o. female who is 10 weeks S/P creation/revision of right upper extremity Hemodialysis access. The patient denies symptoms of numbness, tingling, weakness and denies pain in the operative limb. Patient is here for post -op evaluation to assess healing and maturation of right BVT .  Pt is on hemodialysis through  right IJ catheter on T TH Sat  Note: pt had left CEA and PVD She has carotid duplex and ABI's done in Dr Randolm Idol office  Past Medical History  Diagnosis Date  . IDDM (insulin dependent diabetes mellitus)   . CAD (coronary artery disease)     a. 08/2005 - NSTEMI - Cath: Severe diffuse 3vd w/ NL EF - poor revasc candidate.;  b. NSTEMI 10/2011 treated medically   . Neuropathy   . Ulcer of foot     a. R foot - followed by Dr. Arbie Cookey & [facility] Wound clinic  . Hypertension   . DJD (degenerative joint disease)   . Cataracts, bilateral 07/15/10  . Hyperlipidemia   . CKD (chronic kidney disease), stage V      s/p AV graft '09 that occluded. s/p RUE AV fistula 11/21/11  in anticipation of HD  . GERD (gastroesophageal reflux disease)   . Hiatal hernia   . Heart murmur   . Carotid artery occlusion     a. s/p left CEA 06/2007;  b. 07/2011 u/s - RICA 1-39%, LICA patent  . CHF (congestive heart failure)     Mixed, Echo 11/16/11 Hypokinesis mid/apical inferior and mid/apical inferoseptal, mild LVH, EF 45-50%, high LV filling pressure, mildly dilated RA . Felt due to worsening renal function. Not on ACEI/ARB secondary to renal insufficiency  . History of stroke   . Iron deficiency anemia     Hgb ~9 in February 2013  . History of retinal detachment     a. right eye - repair 11/2007  . History of osteomyelitis     a. Right foot  . PVD (peripheral vascular disease)     a. ABI's  06/2011: R-37, L - 84    Current Outpatient Prescriptions  Medication Sig Dispense Refill  . aspirin (ASPIRIN LOW DOSE) 81 MG EC tablet Take 81 mg by mouth daily.        . Blood Glucose Monitoring Suppl (PRODIGY AUTOCODE BLOOD GLUCOSE) W/DEVICE KIT by Does not apply route 3 (three) times daily.        . calcitRIOL (ROCALTROL) 0.25 MCG capsule Take 0.25 mcg by mouth daily.        . calcium acetate (PHOSLO) 667 MG capsule Take 1 capsule (667 mg total) by mouth 3 (three) times daily with meals.  90 capsule  1  . clopidogrel (PLAVIX) 75 MG tablet Take 75 mg by mouth daily.        Marland Kitchen gabapentin (NEURONTIN) 100 MG tablet Take 100 mg by mouth 3 (three) times daily.        . Glucose Blood KIT by In Vitro route.        Marland Kitchen glucose blood test strip 1 each by Other route 3 (three) times daily. Use as instructed       . insulin aspart (NOVOLOG) 100 UNIT/ML injection Inject 4-12 Units into the skin 3 (three) times daily before meals. Sliding scale        .  insulin aspart (NOVOLOG) 100 UNIT/ML injection Inject 0-5 Units into the skin at bedtime. Adjust according to sliding scale dosing.  1 vial  1  . insulin glargine (LANTUS) 100 UNIT/ML injection Inject 15 Units into the skin 2 (two) times daily. Inject 15 units in the am and 15 units in the pm daily  10 mL  3  . Lancets MISC by Does not apply route 3 (three) times daily.        . pantoprazole (PROTONIX) 40 MG tablet Take 40 mg by mouth daily.       Marland Kitchen DISCONTD: furosemide (LASIX) 80 MG tablet Take 160 mg by mouth 2 (two) times daily.      Marland Kitchen DISCONTD: hydrALAZINE (APRESOLINE) 10 MG tablet Take 10 mg by mouth 3 (three) times daily.      Marland Kitchen DISCONTD: isosorbide mononitrate (IMDUR) 30 MG 24 hr tablet Take 30 mg by mouth daily.      Marland Kitchen DISCONTD: metolazone (ZAROXOLYN) 2.5 MG tablet Take 2.5 mg by mouth See admin instructions. Take 1 tablet daily on Mon, Wed, Fri (take 30 mins before AM dose of Lasix)      . DISCONTD: metoprolol succinate (TOPROL-XL) 25 MG 24 hr  tablet Take 25 mg by mouth daily.      Marland Kitchen DISCONTD: nitroGLYCERIN (NITROSTAT) 0.4 MG SL tablet Place 1 tablet (0.4 mg total) under the tongue every 5 (five) minutes x 3 doses as needed for chest pain (up to 3 doses).  25 tablet  3  . DISCONTD: rosuvastatin (CRESTOR) 10 MG tablet Take 1 tablet (10 mg total) by mouth at bedtime.  30 tablet  3   Soc Hx, ROS reviewed and unchanged.  Physical Examination  Filed Vitals:   01/31/12 1405  BP: 142/70  Pulse: 68  Temp: 97.8 F (36.6 C)    WDWN female in NAD. Lungs clear Heart RRR Left neck carotid scar well healed right upper extremity Incision is healed Skin color is normal   Hand grip is 5/5 and sensation in digits is intact; There is a good thrill and good bruit in the Right BVT. The graft/fistula is easily palpable and of adequate size  Non- Invasive vasc lab: 01/31/2012 Fistula diameter 6-70mm Depth<0.5 cm Increased velocity - 922 and narrowing just prox to anastomosis   Assessment/Plan [name] is a 66 y.o. year old who is s/p creation/revision of right upper extremity Hemodialysis access. BVT is of good size and depth, however she has increased velocity in initial segment of BVT. Fistulogram and probable PTA of BVT planned 02/12/12 by Dr. Edilia Bo Dr. Edilia Bo in to see pt and reviewed procedure and risks/ benefits of fistulogram  Clinic MD: [facility]

## 2012-02-01 ENCOUNTER — Other Ambulatory Visit: Payer: Self-pay | Admitting: *Deleted

## 2012-02-02 ENCOUNTER — Encounter (HOSPITAL_COMMUNITY): Payer: Self-pay | Admitting: Pharmacy Technician

## 2012-02-07 NOTE — Procedures (Unsigned)
VASCULAR LAB EXAM  INDICATION:  Follow-up evaluation of right basilic vein transposition, performed 11/21/2011.  History, end-stage renal disease.  HISTORY: Diabetes: Cardiac: Hypertension:  EXAM:  Patent right arm basilic vein transposition with a short segment of narrowing noted just prior to the anastomosis with velocities reaching 922 cm/s.  IMPRESSION:  Patent right basilic vein transposition with diameter, depth, and velocity measurements shown on the following worksheet.  ___________________________________________ [name]. Edilia Bo, M.D.  EM/MEDQ  D:  01/31/2012  T:  01/31/2012  Job:  [id number]

## 2012-02-08 ENCOUNTER — Other Ambulatory Visit: Payer: Self-pay | Admitting: Physician Assistant

## 2012-02-08 ENCOUNTER — Other Ambulatory Visit: Payer: Self-pay | Admitting: *Deleted

## 2012-02-08 MED ORDER — CALCIUM ACETATE 667 MG PO CAPS: 667.0000 mg | ORAL_CAPSULE | Freq: Three times a day (TID) | ORAL | Status: AC

## 2012-02-12 ENCOUNTER — Ambulatory Visit (HOSPITAL_COMMUNITY)
Admission: RE | Admit: 2012-02-12 | Discharge: 2012-02-12 | Disposition: A | Discharge status: Home / Self Care | Payer: Medicare Other | Source: Ambulatory Visit | Attending: Vascular Surgery | Admitting: Vascular Surgery

## 2012-02-12 ENCOUNTER — Encounter (HOSPITAL_COMMUNITY)
Admission: RE | Disposition: A | Discharge status: Home / Self Care | Payer: Self-pay | Source: Ambulatory Visit | Attending: Vascular Surgery

## 2012-02-12 DIAGNOSIS — Z794 Long term (current) use of insulin: Secondary | ICD-10-CM | POA: Insufficient documentation

## 2012-02-12 DIAGNOSIS — T82898A Other specified complication of vascular prosthetic devices, implants and grafts, initial encounter: Secondary | ICD-10-CM | POA: Insufficient documentation

## 2012-02-12 DIAGNOSIS — I120 Hypertensive chronic kidney disease with stage 5 chronic kidney disease or end stage renal disease: Secondary | ICD-10-CM | POA: Insufficient documentation

## 2012-02-12 DIAGNOSIS — N185 Chronic kidney disease, stage 5: Secondary | ICD-10-CM | POA: Insufficient documentation

## 2012-02-12 DIAGNOSIS — E119 Type 2 diabetes mellitus without complications: Secondary | ICD-10-CM | POA: Insufficient documentation

## 2012-02-12 DIAGNOSIS — K219 Gastro-esophageal reflux disease without esophagitis: Secondary | ICD-10-CM | POA: Insufficient documentation

## 2012-02-12 DIAGNOSIS — Y849 Medical procedure, unspecified as the cause of abnormal reaction of the patient, or of later complication, without mention of misadventure at the time of the procedure: Secondary | ICD-10-CM | POA: Insufficient documentation

## 2012-02-12 HISTORY — PX: SHUNTOGRAM: SHX5491

## 2012-02-12 LAB — GLUCOSE, CAPILLARY: Glucose-Capillary: 250 mg/dL — ABNORMAL HIGH (ref 70–99)

## 2012-02-12 SURGERY — ASSESSMENT, SHUNT FUNCTION, WITH CONTRAST RADIOGRAPHIC STUDY
Anesthesia: LOCAL | Laterality: Right

## 2012-02-12 MED ORDER — HEPARIN (PORCINE) IN NACL 2-0.9 UNIT/ML-% IJ SOLN
INTRAMUSCULAR | Status: AC
  Filled 2012-02-12: qty 1000

## 2012-02-12 MED ORDER — LIDOCAINE HCL (PF) 1 % IJ SOLN
INTRAMUSCULAR | Status: AC
  Filled 2012-02-12: qty 30

## 2012-02-12 MED ORDER — INSULIN ASPART 100 UNIT/ML ~~LOC~~ SOLN
12.0000 [IU] | Freq: Once | SUBCUTANEOUS | Status: AC
  Administered 2012-02-12: 12 [IU] via SUBCUTANEOUS

## 2012-02-12 MED ORDER — SODIUM CHLORIDE 0.9 % IJ SOLN: 3.0000 mL | INTRAMUSCULAR | Status: DC | PRN

## 2012-02-12 NOTE — Discharge Instructions (Signed)
AV Fistula Care After Refer to this sheet in the next few weeks. These instructions provide you with information on caring for yourself after your procedure. Your caregiver may also give you more specific instructions. Your treatment has been planned according to current medical practices, but problems sometimes occur. Call your caregiver if you have any problems or questions after your procedure. HOME CARE INSTRUCTIONS   Do not drive a car or take public transportation alone.   Do not drink alcohol.   Only take medicine that has been prescribed by your caregiver.   Do not sign important papers or make important decisions.   Have a responsible person with you.   Ask your caregiver to show you how to check your access at home for a vibration (called a "thrill") or for a sound (called a "bruit" pronounced brew-ee).   Your vein will need time to enlarge and mature so needles can be inserted for dialysis. Follow your caregiver's instructions about what you need to do to make this happen.   Keep dressings clean and dry.   Keep the arm elevated above your heart. Use a pillow.   Rest.   Use the arm as usual for all activities.   Have the stitches or tape closures removed in 10 to 14 days, or as directed by your caregiver.   Do not sleep or lie on the area of the fistula or that arm. This may decrease or stop the blood flow through your fistula.   Do not allow blood pressures to be taken on this arm.   Do not allow blood drawing to be done from the graft.   Do not wear tight clothing around the access site or on the arm.   Avoid lifting heavy objects with the arm that has the fistula.   Do not use creams or lotions over the access site.  SEEK MEDICAL CARE IF:   You have a fever.   You have swelling around the fistula that gets worse, or you have new pain.   You have unusual bleeding at the fistula site or from any other area.   You have pus or other drainage at the fistula  site.   You have skin redness or red streaking on the skin around, above, or below the fistula site.   Your access site feels warm.   You have any flu-like symptoms.  SEEK IMMEDIATE MEDICAL CARE IF:   You have pain, numbness, or an unusual pale skin on the hand or on the side of your fistula.   You have dizziness or weakness that you have not had before.   You have shortness of breath.   You have chest pain.   Your fistula disconnects or breaks, and there is bleeding that cannot be easily controlled.  Call for local emergency medical help. Do not try to drive yourself to the hospital. MAKE SURE YOU  Understand these instructions.   Will watch your condition.   Will get help right away if you are not doing well or get worse.  Document Released: 09/11/2005 Document Revised: 08/31/2011 Document Reviewed: 03/01/2011 Exit[employer] Patient Information 2012 [employer]. 

## 2012-02-12 NOTE — Interval H&P Note (Signed)
History and Physical Interval Note:  02/12/2012 7:28 AM  [name]  has presented today for surgery, with the diagnosis of End stage renal  The various methods of treatment have been discussed with the patient and family. After consideration of risks, benefits and other options for treatment, the patient has consented to: SHUNTOGRAM RIGHT ARTERIOVENOUS FISTULA, POSSIBLE VENOPLASTY.The patients' history has been reviewed, patient examined, no change in status, stable for surgery.  I have reviewed the patients' chart and labs.  Questions were answered to the patient'[name] satisfaction.     [name]

## 2012-02-12 NOTE — Op Note (Signed)
PATIENT: [name]   MRN: [id number] DOB: December 01, 1945    DATE OF PROCEDURE: 02/12/2012  INDICATIONS: [name] is a 66 y.o. female who had a right basilic vein transposition done on 11/21/11. She was seen in follow up in our office and noted to have a stenosis adjacent to the proximal anastomosis. She was brought in for a fistulogram and possible venoplasty.  PROCEDURE:  1. Ultrasound-guided access to the right upper arm AV fistula x2 2. fistulogram of right basilic vein transposition 3. Venoplasty of proximal stenosis of right basilic vein transposition 4. venoplasty of stenosis of right basilic vein transposition in upper arm  5. venoplasty of subclavian vein stenosis.  SURGEON: [name]. Edilia Bo, MD, FACS  ANESTHESIA: local   EBL: minimal  TECHNIQUE: the patient was brought to the peripheral vascular lab. The right upper extremity was prepped and draped in the usual sterile fashion. Under ultrasound guidance, and after the skin was anesthetized, the upper part of her right upper arm fistula was cannulated with a micropuncture needle and a micropuncture sheath introduced over a wire. Fistulogram was then obtained which demonstrated 3 areas of stenosis. There was a stenosis in the upper part of the arm producing an approximately 80% stenosis. There was also some stenosis in the subclavian vein at the junction of the subclavian vein and right brachiocephalic vein. In addition there was a stenosis just beyond the anastomosis at the antecubital level which produced an approximately 70% stenosis. As initially the sheath was directed towards the arterial anastomosis I elected to address this stenosis first. The micropuncture sheath was exchanged for a short 6 [place] sheath over a [employer] wire. Of note I used a [employer] wire to traverse the anastomosis and then advanced the sheath to exchange the wire to a [employer] wire. The patient then received 2000 units of IV heparin. A 4 mm x 2 cm balloon  was positioned across the stenosis adjacent to the arterial anastomosis. This was inflated to 10 atmospheres for 60 seconds. Completion fistulogram showed some residual stenosis. I therefore went back with a 5 mm x 4 cm balloon which was inflated to 10 atmospheres for 60 seconds. It was a good result with this with no residual stenosis. A 4-0 Monocryl suture was then used to close the sheath entry site after the wire had been removed and the sheath was then removed and pressure held for hemostasis after the suture was tied. There was good hemostasis.  Next I recannulated the fistula in the proximal segment to allow access to the stenoses further distally in the fistula. Was done under ultrasound guidance after the skin was anesthetized. The micropuncture sheath was introduced over a wire and then a Benson wire passed into the superior vena cava under fluoroscopic control. An exchange the micropuncture sheath for a 6 French sheath. The 5 mm x 4 cm balloon was positioned across the stenosis in the upper part of the fistula and inflated to 10 atmospheres for 60 seconds. The balloon was then advanced to the junction of the right subclavian vein and right brachiocephalic vein where the dietetic catheter entered and the balloon was then inflated to 24 atmospheres for 60 seconds. There was still some residual stenosis noted in the upper arm and it also to some degree in the subclavian vein. I therefore went to a 6 mm x 4 cm balloon and this was positioned across the stenosis in the upper part of the fistula inflated to 10 atmospheres for 60 seconds. This  was then repositioned across the subclavian vein stenosis again and inflated to 14 atmospheres for 60 seconds. Completion fistulogram showed some residual stenosis in the upper arm stenosis however, I was reluctant to go to a higher diameter balloon because of the risk of rupture given that the vein was somewhat small. In addition the fistula had an excellent thrill at  this point. The wire was then removed. The 4-0 Monocryl suture was placed around the sheath and the sheath was removed and the suture tied. It was good hemostasis.  FINDINGS:  1. There was a stenosis of approximately 1-1/2 cm in length of approximately 70% just beyond the proximal anastomosis. This was successfully ballooned with a 5 mm in diameter balloon with an excellent result and no residual stenosis. 2. There was a second stenosis in the upper part of the fistula which produced an approximately 80-90% stenosis which was successfully ballooned with a 6 mm balloon with approximately 30% residual stenosis. 3. There was some stenosis which was hard to quantitate where the subclavian vein into the right brachiocephalic vein and dietetic catheter crossed and this was ballooned with an up to a 6 mm balloon with good result. 4. At the completion the fistula had an excellent thrill.  Waverly Ferrari, MD, FACS [facility]  DATE OF DICTATION:   02/12/2012

## 2012-02-12 NOTE — H&P (View-Only) (Signed)
[facility]  Postoperative Visit hemodialysis access   Date of Surgery: 11/21/11 - R BVT Surgeon: [facility] Center: [place], Dr. G[place]  HPI: [name] is a 66 y.o. female who is 10 weeks S/P creation/revision of right upper extremity Hemodialysis access. The patient denies symptoms of numbness, tingling, weakness and denies pain in the operative limb. Patient is here for post -op evaluation to assess healing and maturation of right BVT .  Pt is on hemodialysis through  right IJ catheter on T TH Sat  Note: pt had left CEA and PVD She has carotid duplex and ABI's done in Dr Coopers office  Past Medical History  Diagnosis Date  . IDDM (insulin dependent diabetes mellitus)   . CAD (coronary artery disease)     a. 08/2005 - NSTEMI - Cath: Severe diffuse 3vd w/ NL EF - poor revasc candidate.;  b. NSTEMI 10/2011 treated medically   . Neuropathy   . Ulcer of foot     a. R foot - followed by Dr. Early & [facility]  . Hypertension   . DJD (degenerative joint disease)   . Cataracts, bilateral 07/15/10  . Hyperlipidemia   . CKD (chronic kidney disease), stage V      s/p AV graft '09 that occluded. s/p RUE AV fistula 11/21/11  in anticipation of HD  . GERD (gastroesophageal reflux disease)   . Hiatal hernia   . Heart murmur   . Carotid artery occlusion     a. s/p left CEA 06/2007;  b. 07/2011 u/s - RICA 1-39%, LICA patent  . CHF (congestive heart failure)     Mixed, Echo 11/16/11 Hypokinesis mid/apical inferior and mid/apical inferoseptal, mild LVH, EF 45-50%, high LV filling pressure, mildly dilated RA . Felt due to worsening renal function. Not on ACEI/ARB secondary to renal insufficiency  . History of stroke   . Iron deficiency anemia     Hgb ~9 in February 2013  . History of retinal detachment     a. right eye - repair 11/2007  . History of osteomyelitis     a. Right foot  . PVD (peripheral vascular disease)     a. ABI's  06/2011: R-37, L - 84    Current Outpatient Prescriptions  Medication Sig Dispense Refill  . aspirin (ASPIRIN LOW DOSE) 81 MG EC tablet Take 81 mg by mouth daily.        . Blood Glucose Monitoring Suppl (PRODIGY AUTOCODE BLOOD GLUCOSE) W/DEVICE KIT by Does not apply route 3 (three) times daily.        . calcitRIOL (ROCALTROL) 0.25 MCG capsule Take 0.25 mcg by mouth daily.        . calcium acetate (PHOSLO) 667 MG capsule Take 1 capsule (667 mg total) by mouth 3 (three) times daily with meals.  90 capsule  1  . clopidogrel (PLAVIX) 75 MG tablet Take 75 mg by mouth daily.        . gabapentin (NEURONTIN) 100 MG tablet Take 100 mg by mouth 3 (three) times daily.        . Glucose Blood KIT by In Vitro route.        . glucose blood test strip 1 each by Other route 3 (three) times daily. Use as instructed       . insulin aspart (NOVOLOG) 100 UNIT/ML injection Inject 4-12 Units into the skin 3 (three) times daily before meals. Sliding scale        .   insulin aspart (NOVOLOG) 100 UNIT/ML injection Inject 0-5 Units into the skin at bedtime. Adjust according to sliding scale dosing.  1 vial  1  . insulin glargine (LANTUS) 100 UNIT/ML injection Inject 15 Units into the skin 2 (two) times daily. Inject 15 units in the am and 15 units in the pm daily  10 mL  3  . Lancets MISC by Does not apply route 3 (three) times daily.        . pantoprazole (PROTONIX) 40 MG tablet Take 40 mg by mouth daily.       . DISCONTD: furosemide (LASIX) 80 MG tablet Take 160 mg by mouth 2 (two) times daily.      . DISCONTD: hydrALAZINE (APRESOLINE) 10 MG tablet Take 10 mg by mouth 3 (three) times daily.      . DISCONTD: isosorbide mononitrate (IMDUR) 30 MG 24 hr tablet Take 30 mg by mouth daily.      . DISCONTD: metolazone (ZAROXOLYN) 2.5 MG tablet Take 2.5 mg by mouth See admin instructions. Take 1 tablet daily on Mon, Wed, Fri (take 30 mins before AM dose of Lasix)      . DISCONTD: metoprolol succinate (TOPROL-XL) 25 MG 24 hr  tablet Take 25 mg by mouth daily.      . DISCONTD: nitroGLYCERIN (NITROSTAT) 0.4 MG SL tablet Place 1 tablet (0.4 mg total) under the tongue every 5 (five) minutes x 3 doses as needed for chest pain (up to 3 doses).  25 tablet  3  . DISCONTD: rosuvastatin (CRESTOR) 10 MG tablet Take 1 tablet (10 mg total) by mouth at bedtime.  30 tablet  3   Soc Hx, ROS reviewed and unchanged.  Physical Examination  Filed Vitals:   01/31/12 1405  BP: 142/70  Pulse: 68  Temp: 97.8 F (36.6 C)    WDWN female in NAD. Lungs clear Heart RRR Left neck carotid scar well healed right upper extremity Incision is healed Skin color is normal   Hand grip is 5/5 and sensation in digits is intact; There is a good thrill and good bruit in the Right BVT. The graft/fistula is easily palpable and of adequate size  Non- Invasive vasc lab: 01/31/2012 Fistula diameter 6-7mm Depth<0.5 cm Increased velocity - 922 and narrowing just prox to anastomosis   Assessment/Plan [name] is a 66 y.o. year old who is s/p creation/revision of right upper extremity Hemodialysis access. BVT is of good size and depth, however she has increased velocity in initial segment of BVT. Fistulogram and probable PTA of BVT planned 02/12/12 by Dr. Dickson Dr. Dickson in to see pt and reviewed procedure and risks/ benefits of fistulogram  Clinic MD: [facility]   

## 2012-02-14 LAB — POCT I-STAT, CHEM 8
BUN: 23 mg/dL (ref 6–23)
Calcium, Ion: 1.23 mmol/L (ref 1.12–1.32)
Chloride: 96 mEq/L (ref 96–112)
Glucose, Bld: 383 mg/dL — ABNORMAL HIGH (ref 70–99)
TCO2: 29 mmol/L (ref 0–100)

## 2012-03-12 ENCOUNTER — Other Ambulatory Visit: Payer: Self-pay | Admitting: Physician Assistant

## 2012-03-13 ENCOUNTER — Emergency Department (HOSPITAL_COMMUNITY)
Admission: EM | Admit: 2012-03-13 | Discharge: 2012-03-25 | Disposition: E | Payer: Medicare Other | Attending: Emergency Medicine | Admitting: Emergency Medicine

## 2012-03-13 ENCOUNTER — Encounter (HOSPITAL_COMMUNITY): Payer: Self-pay | Admitting: Emergency Medicine

## 2012-03-13 ENCOUNTER — Emergency Department (HOSPITAL_COMMUNITY): Payer: Medicare Other

## 2012-03-13 ENCOUNTER — Other Ambulatory Visit: Payer: Self-pay

## 2012-03-13 DIAGNOSIS — I509 Heart failure, unspecified: Secondary | ICD-10-CM | POA: Insufficient documentation

## 2012-03-13 DIAGNOSIS — I2510 Atherosclerotic heart disease of native coronary artery without angina pectoris: Secondary | ICD-10-CM | POA: Insufficient documentation

## 2012-03-13 DIAGNOSIS — I469 Cardiac arrest, cause unspecified: Secondary | ICD-10-CM | POA: Insufficient documentation

## 2012-03-13 DIAGNOSIS — N185 Chronic kidney disease, stage 5: Secondary | ICD-10-CM | POA: Insufficient documentation

## 2012-03-13 DIAGNOSIS — Z794 Long term (current) use of insulin: Secondary | ICD-10-CM | POA: Insufficient documentation

## 2012-03-13 DIAGNOSIS — R0602 Shortness of breath: Secondary | ICD-10-CM | POA: Insufficient documentation

## 2012-03-13 DIAGNOSIS — R55 Syncope and collapse: Secondary | ICD-10-CM | POA: Insufficient documentation

## 2012-03-13 DIAGNOSIS — I120 Hypertensive chronic kidney disease with stage 5 chronic kidney disease or end stage renal disease: Secondary | ICD-10-CM | POA: Insufficient documentation

## 2012-03-13 DIAGNOSIS — I739 Peripheral vascular disease, unspecified: Secondary | ICD-10-CM

## 2012-03-13 DIAGNOSIS — E1149 Type 2 diabetes mellitus with other diabetic neurological complication: Secondary | ICD-10-CM | POA: Insufficient documentation

## 2012-03-13 DIAGNOSIS — Z8673 Personal history of transient ischemic attack (TIA), and cerebral infarction without residual deficits: Secondary | ICD-10-CM | POA: Insufficient documentation

## 2012-03-13 DIAGNOSIS — E1142 Type 2 diabetes mellitus with diabetic polyneuropathy: Secondary | ICD-10-CM | POA: Insufficient documentation

## 2012-03-13 LAB — GLUCOSE, CAPILLARY: Glucose-Capillary: 340 mg/dL — ABNORMAL HIGH (ref 70–99)

## 2012-03-13 MED ORDER — SODIUM CHLORIDE 0.9 % IV SOLN
INTRAVENOUS | Status: AC | PRN
  Administered 2012-03-13: 150 mL/h via INTRAVENOUS

## 2012-03-13 MED ORDER — SODIUM BICARBONATE 8.4 % IV SOLN
INTRAVENOUS | Status: AC | PRN
  Administered 2012-03-13 (×2): 50 meq via INTRAVENOUS
  Administered 2012-03-13: 100 meq via INTRAVENOUS
  Administered 2012-03-13: 50 meq via INTRAVENOUS

## 2012-03-13 MED ORDER — DEXTROSE 5 % IV SOLN
300.0000 mg | INTRAVENOUS | Status: AC | PRN
  Administered 2012-03-13: 150 mg via INTRAVENOUS
  Administered 2012-03-13: 300 mg via INTRAVENOUS

## 2012-03-13 MED ORDER — EPINEPHRINE HCL 0.1 MG/ML IJ SOLN
INTRAMUSCULAR | Status: AC | PRN
  Administered 2012-03-13 (×8): 1 via INTRAVENOUS

## 2012-03-13 MED ORDER — CALCIUM CHLORIDE 10 % IV SOLN
INTRAVENOUS | Status: AC | PRN
  Administered 2012-03-13 (×3): 1 g via INTRAVENOUS

## 2012-03-13 NOTE — Code Documentation (Signed)
Pulse checked. No palpable pulses present. Compressions resumed.

## 2012-03-13 NOTE — ED Provider Notes (Signed)
History     CSN: [id number]  Arrival date & time 03/22/2012  1939   First MD Initiated Contact with Patient 03-22-12 2023      Chief Complaint  Patient presents with  . Shortness of Breath    (Consider location/radiation/quality/duration/timing/severity/associated sxs/prior treatment) Patient is a 66 y.o. female presenting with syncope. History provided by: nursing staff; pt was unresponsive when brought back from the waiting room.  Loss of Consciousness This is a new problem. The current episode started today (while in waiting room). The problem occurs constantly. Progression since onset: pt found unresponsive in waiting room of ED where she had presented for dyspnea and productive cough of several days. She had a witnessed cardiac arrest; compressions were initiated immediately and she was brought back to the ED resuscitation bay  Associated symptoms include coughing.    Past Medical History  Diagnosis Date  . IDDM (insulin dependent diabetes mellitus)   . CAD (coronary artery disease)     a. 08/2005 - NSTEMI - Cath: Severe diffuse 3vd w/ NL EF - poor revasc candidate.;  b. NSTEMI 10/2011 treated medically   . Neuropathy   . Ulcer of foot     a. R foot - followed by Dr. Arbie Cookey & [facility]  . Hypertension   . DJD (degenerative joint disease)   . Cataracts, bilateral 07/15/10  . Hyperlipidemia   . CKD (chronic kidney disease), stage V      s/p AV graft '09 that occluded. s/p RUE AV fistula 11/21/11  in anticipation of HD  . GERD (gastroesophageal reflux disease)   . Hiatal hernia   . Heart murmur   . Carotid artery occlusion     a. s/p left CEA 06/2007;  b. 07/2011 u/s - RICA 1-39%, LICA patent  . CHF (congestive heart failure)     Mixed, Echo 11/16/11 Hypokinesis mid/apical inferior and mid/apical inferoseptal, mild LVH, EF 45-50%, high LV filling pressure, mildly dilated RA . Felt due to worsening renal function. Not on ACEI/ARB secondary to renal insufficiency  .  History of stroke   . Iron deficiency anemia     Hgb ~9 in February 2013  . History of retinal detachment     a. right eye - repair 11/2007  . History of osteomyelitis     a. Right foot  . PVD (peripheral vascular disease)     a. ABI's 06/2011: R-37, L - 84     Past Surgical History  Procedure Date  . Cataract extraction   . Arteriovenous graft placement 2009  . Carotid endarterectomy 2008  . Av fistula placement 11/21/2011    Procedure: ARTERIOVENOUS (AV) FISTULA CREATION;  Surgeon: Chuck Hint, MD;  Location: [facility] OR;  Service: Vascular;  Laterality: N/A;  Right basilic vein transposition  . Insertion of dialysis catheter 12/07/2011    Procedure: INSERTION OF DIALYSIS CATHETER;  Surgeon: Fransisco Hertz, MD;  Location: [facility] OR;  Service: Vascular;  Laterality: Right;    Family History  Problem Relation Age of Onset  . Hypertension Other   . Stroke Other   . Heart attack Other   . Cancer Other   . Heart disease Other   . Diabetes Mother   . Cancer Mother     lung - died 70  . Cirrhosis Father     died 70  . Cancer Sister     liver  . Cancer Son     lymphatic    History  Substance Use Topics  .  Smoking status: Former Smoker    Types: Cigarettes  . Smokeless tobacco: Never Used   Comment: smoked a pack/week for about 20 yrs.  Quit about 25 y ago  . Alcohol Use: No     rare beer    OB History    Grav Para Term Preterm Abortions TAB SAB Ect Mult Living                  Review of Systems  Unable to perform ROS: Unstable vital signs  Respiratory: Positive for cough.   Cardiovascular: Positive for syncope.    Allergies  Codeine  Home Medications   Current Outpatient Rx  Name Route Sig Dispense Refill  . ASPIRIN 81 MG PO TBEC Oral Take 81 mg by mouth daily.      Marland Kitchen CALCITRIOL 0.25 MCG PO CAPS Oral Take 0.25 mcg by mouth daily.      Marland Kitchen CALCIUM ACETATE 667 MG PO CAPS Oral Take 1 capsule (667 mg total) by mouth 3 (three) times daily with meals. 90 capsule 0    . CLOPIDOGREL BISULFATE 75 MG PO TABS Oral Take 75 mg by mouth daily.      Marland Kitchen GABAPENTIN 100 MG PO TABS Oral Take 100 mg by mouth 3 (three) times daily.      . INSULIN ASPART 100 UNIT/ML [place] SOLN Subcutaneous Inject 4-12 Units into the skin 4 (four) times daily -  before meals and at bedtime. Sliding scale      . INSULIN GLARGINE 100 UNIT/ML [place] SOLN Subcutaneous Inject 15 Units into the skin 2 (two) times daily.    Marland Kitchen PANTOPRAZOLE SODIUM 40 MG PO TBEC Oral Take 40 mg by mouth daily.     Marland Kitchen PRAVASTATIN SODIUM 20 MG PO TABS Oral Take 20 mg by mouth daily.      BP 103/55  Pulse 103  Temp 98.5 F (36.9 C) (Oral)  Resp 24  SpO2 91%  Physical Exam  Nursing note and vitals reviewed. Constitutional: She appears well-developed and well-nourished.  HENT:  Head: Normocephalic and atraumatic.  Right Ear: External ear normal.  Left Ear: External ear normal.  Nose: Nose normal.  Mouth/Throat: Oropharynx is clear and moist.  Eyes: Conjunctivae are normal.  Neck: Normal range of motion. Neck supple.  Cardiovascular:       Pulseless   Pulmonary/Chest: She has rales.  Abdominal: Soft. Bowel sounds are normal. She exhibits no distension and no mass.  Musculoskeletal: Normal range of motion. She exhibits no edema and no tenderness.  Neurological: GCS eye subscore is 1. GCS verbal subscore is 1. GCS motor subscore is 4.       Comatose GCS 6  Skin: There is pallor.       Pale     ED Course  INTUBATION Performed by: Clemetine Marker Authorized by: Billee Cashing Consent: The procedure was performed in an emergent situation. Patient identity confirmed: anonymous protocol, patient vented/unresponsive Indications: airway protection, hypoxemia and hypercapnia Intubation method: direct Patient status: unconscious Laryngoscope size: Mac 3 Tube size: 7.5 mm Tube type: cuffed Number of attempts: 1 Cricoid pressure: no Cords visualized: yes Post-procedure assessment: chest rise and ETCO2  monitor Breath sounds: equal and absent over the epigastrium Cuff inflated: yes ETT to lip: 19 cm Tube secured with: ETT holder Patient tolerance: Patient tolerated the procedure well with no immediate complications.   (including critical care time)  Labs Reviewed  GLUCOSE, CAPILLARY - Abnormal; Notable for the following:    Glucose-Capillary 340 (*)  All other components within normal limits   No results found.   1. Cardiac arrest     MDM  Pt found unresponsive in waiting room of ED where she had presented for dyspnea and productive cough of several days. She had a witnessed cardiac arrest; compressions were initiated immediately and she was brought back to the ED resuscitation bay where ACLS protocol was followed. Initial rhythm was Ventricular Fibrillation and she was defibrillated. See Code sheet for details of multiple defibrillations and multiple Epi, Bicarb, Mg, Ca administrations. Pt was intubated without complication (see procedure note). Despite aggressive attempts at resuscitation for approx 35 minutes and attempts to find reversible causes (such as hyperkalemia in this ESRD patient), she was pronounced dead at 20:44. Her daughter was at the bedside and I had lengthy conversations regarding how she was found and our aggressive attempts at resuscitation.   I also spoke with [facility] (Dr. Christell Constant is PCP) regarding details of arrest and code.        Clemetine Marker, MD 2012-03-28 [phone]

## 2012-03-13 NOTE — Code Documentation (Signed)
Shocked @ 300 Joules

## 2012-03-13 NOTE — Code Documentation (Signed)
Pulse check. No palpable pulses. Compressions resumed.

## 2012-03-13 NOTE — ED Notes (Signed)
PT. REPORTS PROGRESSING SOB AND OCCASIONAL PRODUCTIVE COUGH FOR 5 DAYS WORSE TODAY , HEMODIALYSIS YESTERDAY , SLIGHT CHILLS WITH NO FEVER , DENIES CHEST PAIN  OR NAUSEA.

## 2012-03-13 NOTE — Code Documentation (Signed)
Family and chaplin at bedside. Family updated as to patient's status by EDP.

## 2012-03-13 NOTE — Code Documentation (Signed)
Carotid pulses present.

## 2012-03-13 NOTE — ED Notes (Signed)
Patient was found unresponsive and pulseless in triage waiting. Was emergently transported via wheelchair to trauma room C. CPR started at that time.

## 2012-03-13 NOTE — ED Notes (Signed)
Pts belongings placed at bedside by EMT Steward Drone - witnessed by EMT Augusto Gamble and extern Marylene Land.

## 2012-03-13 NOTE — Code Documentation (Signed)
CBG- 340 

## 2012-03-13 NOTE — Code Documentation (Signed)
Pulse check. No palpable pulses present. Compressions resumed.

## 2012-03-13 NOTE — Code Documentation (Signed)
Shocked at 200 Joules

## 2012-03-13 NOTE — Code Documentation (Signed)
Family at beside. Chaplin at bedside. Family updated by EDP. Family given emotional support.

## 2012-03-13 NOTE — Progress Notes (Signed)
Chaplain responded to a CPR in progress, which then became a patient death. Chaplain worked with patient associate Chyrl Civatte to provide care to the family, including the patient's daughter and the daughter's boyfriend. Chaplain experienced the patient's daughter as in shock. Chaplain observed the daughter's boyfriend as tearful. Chaplain provided grief support to both people. Chyrl Civatte will contact chaplain if needed further.

## 2012-03-13 NOTE — Code Documentation (Signed)
Patient expired at 2044. Pronounced by Dr. Clemetine Marker & Dr. Benjiman Core. Family and chaplin at bedside

## 2012-03-13 NOTE — Code Documentation (Signed)
Shocked @ 200 Joules  

## 2012-03-13 NOTE — Code Documentation (Signed)
Shocked @ 200 Joules

## 2012-03-13 NOTE — Code Documentation (Signed)
Pulse check. No pulses present. Compressions resumed.

## 2012-03-14 LAB — POCT I-STAT, CHEM 8
BUN: 30 mg/dL — ABNORMAL HIGH (ref 6–23)
Chloride: 93 mEq/L — ABNORMAL LOW (ref 96–112)
Creatinine, Ser: 3.8 mg/dL — ABNORMAL HIGH (ref 0.50–1.10)
Potassium: 3.7 mEq/L (ref 3.5–5.1)
Sodium: 134 mEq/L — ABNORMAL LOW (ref 135–145)
TCO2: 28 mmol/L (ref 0–100)

## 2012-03-14 LAB — POCT I-STAT TROPONIN I

## 2012-03-14 MED FILL — Medication: Qty: 1 | Status: AC

## 2012-03-25 NOTE — ED Provider Notes (Signed)
I saw and evaluated the patient, reviewed the resident's note and I agree with the findings and plan. Patient came to the resuscitation and cardiac arrest. No return of vitals, but during the code had some cardiac activity on bedside echo. No return of circulation. Patient was pronounced the family was notified.  Juliet Rude. Rubin Payor, MD 03/24/2012 1455

## 2012-03-25 DEATH — deceased

## 2012-04-04 ENCOUNTER — Ambulatory Visit: Payer: Medicare Other | Admitting: Vascular Surgery

## 2012-05-15 ENCOUNTER — Ambulatory Visit: Payer: Medicare Other | Admitting: Cardiovascular Disease

## 2012-08-08 ENCOUNTER — Other Ambulatory Visit: Payer: Medicare Other

## 2012-08-08 ENCOUNTER — Ambulatory Visit: Payer: Medicare Other | Admitting: Neurosurgery

## 2012-12-02 ENCOUNTER — Ambulatory Visit (INDEPENDENT_AMBULATORY_CARE_PROVIDER_SITE_OTHER): Payer: Medicare Other | Admitting: Ophthalmology

## 2013-09-29 NOTE — Telephone Encounter (Signed)
No other info °

## 2014-09-03 ENCOUNTER — Encounter (HOSPITAL_COMMUNITY): Payer: Self-pay | Admitting: Vascular Surgery

## 2014-09-10 ENCOUNTER — Other Ambulatory Visit: Payer: Self-pay | Admitting: Nurse Practitioner
